# Patient Record
Sex: Female | Born: 1935 | Race: Asian | Hispanic: No | Marital: Married | Smoking: Never smoker
Health system: Southern US, Community
[De-identification: ages and names within clinical notes are randomized; demographics above are authoritative.]

## PROBLEM LIST (undated history)

## (undated) DIAGNOSIS — I219 Acute myocardial infarction, unspecified: Secondary | ICD-10-CM

## (undated) DIAGNOSIS — I639 Cerebral infarction, unspecified: Secondary | ICD-10-CM

## (undated) DIAGNOSIS — E7800 Pure hypercholesterolemia, unspecified: Secondary | ICD-10-CM

## (undated) DIAGNOSIS — I509 Heart failure, unspecified: Secondary | ICD-10-CM

## (undated) DIAGNOSIS — I10 Essential (primary) hypertension: Secondary | ICD-10-CM

---

## 2010-09-03 ENCOUNTER — Inpatient Hospital Stay (INDEPENDENT_AMBULATORY_CARE_PROVIDER_SITE_OTHER)
Admission: RE | Admit: 2010-09-03 | Discharge: 2010-09-03 | Disposition: A | Discharge status: Home / Self Care | Payer: Medicaid Other | Source: Ambulatory Visit | Attending: Family Medicine | Admitting: Family Medicine

## 2010-09-03 DIAGNOSIS — F449 Dissociative and conversion disorder, unspecified: Secondary | ICD-10-CM

## 2012-05-08 ENCOUNTER — Emergency Department (HOSPITAL_COMMUNITY)
Admission: EM | Admit: 2012-05-08 | Discharge: 2012-05-08 | Disposition: A | Discharge status: Home / Self Care | Payer: Medicaid Other | Attending: Emergency Medicine | Admitting: Emergency Medicine

## 2012-05-08 ENCOUNTER — Emergency Department (HOSPITAL_COMMUNITY): Payer: Medicaid Other

## 2012-05-08 ENCOUNTER — Encounter (HOSPITAL_COMMUNITY): Payer: Self-pay | Admitting: Emergency Medicine

## 2012-05-08 DIAGNOSIS — E7800 Pure hypercholesterolemia, unspecified: Secondary | ICD-10-CM | POA: Insufficient documentation

## 2012-05-08 DIAGNOSIS — R079 Chest pain, unspecified: Secondary | ICD-10-CM | POA: Insufficient documentation

## 2012-05-08 DIAGNOSIS — Z79899 Other long term (current) drug therapy: Secondary | ICD-10-CM | POA: Insufficient documentation

## 2012-05-08 DIAGNOSIS — I252 Old myocardial infarction: Secondary | ICD-10-CM | POA: Insufficient documentation

## 2012-05-08 DIAGNOSIS — I10 Essential (primary) hypertension: Secondary | ICD-10-CM | POA: Insufficient documentation

## 2012-05-08 DIAGNOSIS — R0609 Other forms of dyspnea: Secondary | ICD-10-CM | POA: Insufficient documentation

## 2012-05-08 DIAGNOSIS — R0989 Other specified symptoms and signs involving the circulatory and respiratory systems: Secondary | ICD-10-CM | POA: Insufficient documentation

## 2012-05-08 HISTORY — DX: Acute myocardial infarction, unspecified: I21.9

## 2012-05-08 HISTORY — DX: Essential (primary) hypertension: I10

## 2012-05-08 HISTORY — DX: Pure hypercholesterolemia, unspecified: E78.00

## 2012-05-08 LAB — POCT I-STAT TROPONIN I

## 2012-05-08 LAB — COMPREHENSIVE METABOLIC PANEL
BUN: 21 mg/dL (ref 6–23)
CO2: 27 mEq/L (ref 19–32)
Calcium: 8.9 mg/dL (ref 8.4–10.5)
Creatinine, Ser: 0.96 mg/dL (ref 0.50–1.10)
GFR calc Af Amer: 64 mL/min — ABNORMAL LOW (ref >90)
GFR calc non Af Amer: 56 mL/min — ABNORMAL LOW (ref >90)
Glucose, Bld: 99 mg/dL (ref 70–99)
Total Protein: 7.7 g/dL (ref 6.0–8.3)

## 2012-05-08 LAB — CBC
HCT: 28.5 % — ABNORMAL LOW (ref 36.0–46.0)
Hemoglobin: 9.7 g/dL — ABNORMAL LOW (ref 12.0–15.0)
MCH: 25.5 pg — ABNORMAL LOW (ref 26.0–34.0)
MCHC: 34 g/dL (ref 30.0–36.0)
MCV: 75 fL — ABNORMAL LOW (ref 78.0–100.0)

## 2012-05-08 LAB — URINALYSIS, ROUTINE W REFLEX MICROSCOPIC
Leukocytes, UA: NEGATIVE
Nitrite: NEGATIVE
Specific Gravity, Urine: 1.011 (ref 1.005–1.030)
Urobilinogen, UA: 0.2 mg/dL (ref 0.0–1.0)
pH: 6 (ref 5.0–8.0)

## 2012-05-08 MED ORDER — SODIUM CHLORIDE 0.9 % IV SOLN
20.0000 mL | INTRAVENOUS | Status: DC
  Administered 2012-05-08: 20 mL via INTRAVENOUS

## 2012-05-08 NOTE — ED Notes (Signed)
Per EMS: pt only speak [place]; on scene pt c/o chest pain using family as interpreters; pt given 1 nitro 324 ASA en route; per family pt has cardiac history; NKDA; HTN and high cholesterol; unknown if stent was placed; pt takes atrovastatin and lisinopril; 24 G placed in R wrist; 12 lead unremarkable; oxgeyn saturation 91% on room air on scene pt placed on 2L and came up to 100%; BP 166/92 after 1 nitro given

## 2012-05-08 NOTE — ED Notes (Signed)
Pt's BP according to her personal BP machine is 147/98

## 2012-05-08 NOTE — ED Notes (Signed)
Used faces scale to assess pts pain; pt resting in bed currently; NAD noted at this time.

## 2012-05-08 NOTE — ED Notes (Signed)
Using interpreter phone to assess pt; pt states that she is here because when her blood pressure gets high she has a hard time breathing; pt denies chest pain; pt denies blurred vision or headache; pt states her fingertips are numb/tingling; pt alert and mentating appropriately; pt denies n/v/d. Pt denies dizziness and lightheadedness.

## 2012-05-08 NOTE — ED Provider Notes (Signed)
History     CSN: [id number]  Arrival date & time 05/08/12  1128   First MD Initiated Contact with Patient 05/08/12 1155      Chief Complaint  Patient presents with  . Shortness of Breath  . Chest Pain    (Consider location/radiation/quality/duration/timing/severity/associated sxs/prior treatment) HPI Comments: Erin Patterson is a 79 y.o. Female who presents from home, for evaluation, of elevated blood pressure. Her friend interprets for her. This morning the patient had elevated blood pressures approximately 200/150. This was measured in the left arm, by herself. She also had shortness of breath this morning. She is unstable antihypertensive treatment by her PCP. Her PCP, is an Warehouse manager Charlea Nardo, with [facility], in [place]. The patient denies headache, dizziness, chest pain, back pain, change in bowel or urine habits. There are no known modifying factors  Patient is a 79 y.o. female presenting with shortness of breath and chest pain. The history is provided by the patient.  Shortness of Breath Associated symptoms: chest pain   Chest Pain Associated symptoms: shortness of breath     Past Medical History  Diagnosis Date  . MI (myocardial infarction)   . Hypertension   . Hypercholesteremia     History reviewed. No pertinent past surgical history.  History reviewed. No pertinent family history.  History  Substance Use Topics  . Smoking status: Not on file  . Smokeless tobacco: Not on file  . Alcohol Use: Not on file    OB History   Grav Para Term Preterm Abortions TAB SAB Ect Mult Living                  Review of Systems  Respiratory: Positive for shortness of breath.   Cardiovascular: Positive for chest pain.  All other systems reviewed and are negative.    Allergies  Review of patient's allergies indicates no known allergies.  Home Medications   Current Outpatient Rx  Name  Route  Sig  Dispense  Refill  . atorvastatin (LIPITOR)  20 MG tablet   Oral   Take 20 mg by mouth daily.         Marland Kitchen lisinopril-hydrochlorothiazide (PRINZIDE,ZESTORETIC) 20-25 MG per tablet   Oral   Take 1 tablet by mouth daily.         . megestrol (MEGACE) 40 MG/ML suspension   Oral   Take 400 mg by mouth daily.           BP 165/91  Pulse 90  Temp(Src) 98.2 F (36.8 C) (Oral)  Resp 17  SpO2 98%  Physical Exam  Nursing note and vitals reviewed. Constitutional: She is oriented to person, place, and time. She appears well-developed.  Frail, elderly  HENT:  Head: Normocephalic and atraumatic.  Eyes: Conjunctivae and EOM are normal. Pupils are equal, round, and reactive to light.  Neck: Normal range of motion and phonation normal. Neck supple.  Cardiovascular: Normal rate, regular rhythm and intact distal pulses.   Pulmonary/Chest: Effort normal and breath sounds normal. No respiratory distress. She has no wheezes. She has no rales. She exhibits no tenderness.  Abdominal: Soft. She exhibits no distension. There is no tenderness. There is no guarding.  Musculoskeletal: Normal range of motion.  Neurological: She is alert and oriented to person, place, and time. She has normal strength. No cranial nerve deficit. She exhibits normal muscle tone. Coordination normal.  Skin: Skin is warm and dry.  Psychiatric: She has a normal mood and affect. Her behavior is  normal. Judgment and thought content normal.    ED Course  Procedures (including critical care time)  Vital signs monitored closely and blood pressure showed only mild elevation.  Findings discussed with patient and several family members with her. All questions answered.  Labs Reviewed  CBC - Abnormal; Notable for the following:    RBC 3.80 (*)    Hemoglobin 9.7 (*)    HCT 28.5 (*)    MCV 75.0 (*)    MCH 25.5 (*)    Platelets 143 (*)    All other components within normal limits  COMPREHENSIVE METABOLIC PANEL - Abnormal; Notable for the following:    Sodium 134 (*)     Albumin 3.4 (*)    GFR calc non Af Amer 56 (*)    GFR calc Af Amer 64 (*)    All other components within normal limits  URINALYSIS, ROUTINE W REFLEX MICROSCOPIC  POCT I-STAT TROPONIN I   Dg Chest Portable 1 View  05/08/2012  *RADIOLOGY REPORT*  Clinical Data: Chest pain, shortness of breath  PORTABLE CHEST - 1 VIEW  Comparison: None.  Findings: Lungs are essentially clear.  No focal consolidation. No pleural effusion or pneumothorax.  The heart is top normal in size.  IMPRESSION: No evidence of acute cardiopulmonary disease.   Original Report Authenticated By: Charline Bills, M.D.    Nursing Notes Reviewed/ Care Coordinated Applicable Imaging Reviewed Interpretation of Laboratory Data incorporated into ED treatment  1. Hypertension       MDM  Mild hypertension with dyspnea. No apparent heart failure, ACS, pneumonia, or apparent bronchitis. Doubt metabolic instability, serious bacterial infection or impending vascular collapse; the patient is stable for discharge.  Plan: Home Medications- usual; Home Treatments- rest; Recommended follow up- PCP check up 1 week     Flint Melter, MD 05/08/12 2151

## 2014-12-10 ENCOUNTER — Emergency Department (HOSPITAL_COMMUNITY): Payer: Medicaid Other

## 2014-12-10 ENCOUNTER — Inpatient Hospital Stay (HOSPITAL_COMMUNITY)
Admission: EM | Admit: 2014-12-10 | Discharge: 2014-12-13 | DRG: 103 | Disposition: A | Discharge status: Home / Self Care | Payer: Medicaid Other | Attending: Family Medicine | Admitting: Family Medicine

## 2014-12-10 DIAGNOSIS — R509 Fever, unspecified: Secondary | ICD-10-CM

## 2014-12-10 DIAGNOSIS — Z881 Allergy status to other antibiotic agents status: Secondary | ICD-10-CM

## 2014-12-10 DIAGNOSIS — I219 Acute myocardial infarction, unspecified: Secondary | ICD-10-CM

## 2014-12-10 DIAGNOSIS — H538 Other visual disturbances: Secondary | ICD-10-CM | POA: Diagnosis present

## 2014-12-10 DIAGNOSIS — R519 Headache, unspecified: Secondary | ICD-10-CM

## 2014-12-10 DIAGNOSIS — I213 ST elevation (STEMI) myocardial infarction of unspecified site: Secondary | ICD-10-CM | POA: Diagnosis not present

## 2014-12-10 DIAGNOSIS — E7800 Pure hypercholesterolemia, unspecified: Secondary | ICD-10-CM | POA: Diagnosis present

## 2014-12-10 DIAGNOSIS — R51 Headache: Secondary | ICD-10-CM | POA: Diagnosis not present

## 2014-12-10 DIAGNOSIS — E785 Hyperlipidemia, unspecified: Secondary | ICD-10-CM

## 2014-12-10 DIAGNOSIS — H510 Palsy (spasm) of conjugate gaze: Secondary | ICD-10-CM | POA: Diagnosis present

## 2014-12-10 DIAGNOSIS — H547 Unspecified visual loss: Secondary | ICD-10-CM

## 2014-12-10 DIAGNOSIS — B348 Other viral infections of unspecified site: Secondary | ICD-10-CM | POA: Diagnosis present

## 2014-12-10 DIAGNOSIS — I252 Old myocardial infarction: Secondary | ICD-10-CM

## 2014-12-10 DIAGNOSIS — I2510 Atherosclerotic heart disease of native coronary artery without angina pectoris: Secondary | ICD-10-CM

## 2014-12-10 DIAGNOSIS — I10 Essential (primary) hypertension: Secondary | ICD-10-CM | POA: Diagnosis present

## 2014-12-10 DIAGNOSIS — E876 Hypokalemia: Secondary | ICD-10-CM | POA: Diagnosis present

## 2014-12-10 DIAGNOSIS — R262 Difficulty in walking, not elsewhere classified: Secondary | ICD-10-CM | POA: Diagnosis present

## 2014-12-10 LAB — PROTEIN, CSF: TOTAL PROTEIN, CSF: 21 mg/dL (ref 15–45)

## 2014-12-10 LAB — GLUCOSE, CSF: GLUCOSE CSF: 60 mg/dL (ref 40–70)

## 2014-12-10 LAB — URINALYSIS, ROUTINE W REFLEX MICROSCOPIC
Bilirubin Urine: NEGATIVE
Glucose, UA: NEGATIVE mg/dL
Hgb urine dipstick: NEGATIVE
KETONES UR: 15 mg/dL — AB
LEUKOCYTES UA: NEGATIVE
NITRITE: NEGATIVE
PH: 6 (ref 5.0–8.0)
Protein, ur: NEGATIVE mg/dL
SPECIFIC GRAVITY, URINE: 1.015 (ref 1.005–1.030)
Urobilinogen, UA: 1 mg/dL (ref 0.0–1.0)

## 2014-12-10 LAB — CSF CELL COUNT WITH DIFFERENTIAL
RBC Count, CSF: 445 /mm3 — ABNORMAL HIGH
RBC Count, CSF: 256 /mm3 — ABNORMAL HIGH
TUBE #: 4
Tube #: 1
WBC, CSF: 1 /mm3 (ref 0–5)
WBC, CSF: 3 /mm3 (ref 0–5)

## 2014-12-10 LAB — COMPREHENSIVE METABOLIC PANEL
ALT: 25 U/L (ref 14–54)
AST: 39 U/L (ref 15–41)
Albumin: 3.6 g/dL (ref 3.5–5.0)
Alkaline Phosphatase: 60 U/L (ref 38–126)
Anion gap: 10 (ref 5–15)
BUN: 24 mg/dL — AB (ref 6–20)
CHLORIDE: 97 mmol/L — AB (ref 101–111)
CO2: 24 mmol/L (ref 22–32)
Calcium: 8.7 mg/dL — ABNORMAL LOW (ref 8.9–10.3)
Creatinine, Ser: 1.02 mg/dL — ABNORMAL HIGH (ref 0.44–1.00)
GFR calc Af Amer: 59 mL/min — ABNORMAL LOW (ref >60)
GFR calc non Af Amer: 51 mL/min — ABNORMAL LOW (ref >60)
GLUCOSE: 127 mg/dL — AB (ref 65–99)
POTASSIUM: 3.4 mmol/L — AB (ref 3.5–5.1)
Sodium: 131 mmol/L — ABNORMAL LOW (ref 135–145)
Total Bilirubin: 0.9 mg/dL (ref 0.3–1.2)
Total Protein: 8.4 g/dL — ABNORMAL HIGH (ref 6.5–8.1)

## 2014-12-10 LAB — CBC WITH DIFFERENTIAL/PLATELET
BASOS ABS: 0 10*3/uL (ref 0.0–0.1)
Basophils Relative: 0 %
EOS PCT: 0 %
Eosinophils Absolute: 0 10*3/uL (ref 0.0–0.7)
HEMATOCRIT: 31.2 % — AB (ref 36.0–46.0)
HEMOGLOBIN: 10.6 g/dL — AB (ref 12.0–15.0)
LYMPHS ABS: 0.6 10*3/uL — AB (ref 0.7–4.0)
LYMPHS PCT: 8 %
MCH: 25.9 pg — AB (ref 26.0–34.0)
MCHC: 34 g/dL (ref 30.0–36.0)
MCV: 76.3 fL — AB (ref 78.0–100.0)
Monocytes Absolute: 0.7 10*3/uL (ref 0.1–1.0)
Monocytes Relative: 9 %
NEUTROS ABS: 6.6 10*3/uL (ref 1.7–7.7)
NEUTROS PCT: 83 %
PLATELETS: 137 10*3/uL — AB (ref 150–400)
RBC: 4.09 MIL/uL (ref 3.87–5.11)
RDW: 14.3 % (ref 11.5–15.5)
WBC: 7.9 10*3/uL (ref 4.0–10.5)

## 2014-12-10 LAB — I-STAT CG4 LACTIC ACID, ED
LACTIC ACID, VENOUS: 0.78 mmol/L (ref 0.5–2.0)
LACTIC ACID, VENOUS: 0.79 mmol/L (ref 0.5–2.0)

## 2014-12-10 LAB — SEDIMENTATION RATE: Sed Rate: 56 mm/hr — ABNORMAL HIGH (ref 0–22)

## 2014-12-10 LAB — C-REACTIVE PROTEIN: CRP: 2 mg/dL — ABNORMAL HIGH (ref <1.0)

## 2014-12-10 LAB — TSH: TSH: 0.33 u[IU]/mL — ABNORMAL LOW (ref 0.350–4.500)

## 2014-12-10 MED ORDER — SODIUM CHLORIDE 0.9 % IV BOLUS (SEPSIS)
1000.0000 mL | Freq: Once | INTRAVENOUS | Status: AC
  Administered 2014-12-10: 1000 mL via INTRAVENOUS

## 2014-12-10 MED ORDER — ACETAMINOPHEN 650 MG RE SUPP: 650.0000 mg | Freq: Four times a day (QID) | RECTAL | Status: DC | PRN

## 2014-12-10 MED ORDER — LISINOPRIL 20 MG PO TABS
20.0000 mg | ORAL_TABLET | Freq: Every day | ORAL | Status: DC
  Administered 2014-12-11 – 2014-12-13 (×3): 20 mg via ORAL
  Filled 2014-12-10 (×3): qty 1

## 2014-12-10 MED ORDER — ACETAMINOPHEN 325 MG PO TABS
650.0000 mg | ORAL_TABLET | Freq: Once | ORAL | Status: AC
  Administered 2014-12-10: 650 mg via ORAL
  Filled 2014-12-10: qty 2

## 2014-12-10 MED ORDER — SODIUM CHLORIDE 0.9 % IV SOLN
INTRAVENOUS | Status: DC
  Administered 2014-12-12 – 2014-12-13 (×3): via INTRAVENOUS

## 2014-12-10 MED ORDER — ACETAMINOPHEN 325 MG PO TABS: 650.0000 mg | ORAL_TABLET | Freq: Once | ORAL | Status: DC

## 2014-12-10 MED ORDER — LIDOCAINE-EPINEPHRINE (PF) 2 %-1:200000 IJ SOLN
20.0000 mL | Freq: Once | INTRAMUSCULAR | Status: AC
  Administered 2014-12-10: 20 mL via INTRADERMAL
  Filled 2014-12-10: qty 20

## 2014-12-10 MED ORDER — MORPHINE SULFATE (PF) 2 MG/ML IV SOLN
2.0000 mg | Freq: Once | INTRAVENOUS | Status: DC
  Filled 2014-12-10: qty 1

## 2014-12-10 MED ORDER — DILTIAZEM HCL ER COATED BEADS 180 MG PO CP24
180.0000 mg | ORAL_CAPSULE | Freq: Every day | ORAL | Status: DC
  Administered 2014-12-11 – 2014-12-13 (×3): 180 mg via ORAL
  Filled 2014-12-10 (×3): qty 1

## 2014-12-10 MED ORDER — HYDROCHLOROTHIAZIDE 25 MG PO TABS
12.5000 mg | ORAL_TABLET | Freq: Every day | ORAL | Status: DC
  Administered 2014-12-10 – 2014-12-13 (×4): 12.5 mg via ORAL
  Filled 2014-12-10 (×4): qty 1

## 2014-12-10 MED ORDER — ENOXAPARIN SODIUM 40 MG/0.4ML ~~LOC~~ SOLN: 40.0000 mg | SUBCUTANEOUS | Status: DC

## 2014-12-10 MED ORDER — DEXAMETHASONE SODIUM PHOSPHATE 10 MG/ML IJ SOLN
10.0000 mg | Freq: Once | INTRAMUSCULAR | Status: AC
  Administered 2014-12-10: 10 mg via INTRAVENOUS
  Filled 2014-12-10: qty 1

## 2014-12-10 MED ORDER — DEXTROSE 5 % IV SOLN
2.0000 g | Freq: Once | INTRAVENOUS | Status: AC
  Administered 2014-12-10: 2 g via INTRAVENOUS
  Filled 2014-12-10: qty 2

## 2014-12-10 MED ORDER — PREDNISONE 20 MG PO TABS
60.0000 mg | ORAL_TABLET | Freq: Every day | ORAL | Status: DC
  Administered 2014-12-11 – 2014-12-13 (×3): 60 mg via ORAL
  Filled 2014-12-10 (×3): qty 3

## 2014-12-10 MED ORDER — ATORVASTATIN CALCIUM 10 MG PO TABS
20.0000 mg | ORAL_TABLET | Freq: Every day | ORAL | Status: DC
  Administered 2014-12-10 – 2014-12-13 (×4): 20 mg via ORAL
  Filled 2014-12-10 (×4): qty 2

## 2014-12-10 MED ORDER — SODIUM CHLORIDE 0.9 % IV SOLN
2.0000 g | Freq: Once | INTRAVENOUS | Status: AC
  Administered 2014-12-10: 2 g via INTRAVENOUS
  Filled 2014-12-10: qty 2000

## 2014-12-10 MED ORDER — DEXTROSE 5 % IV SOLN
2.0000 g | Freq: Two times a day (BID) | INTRAVENOUS | Status: DC
  Administered 2014-12-11 – 2014-12-12 (×3): 2 g via INTRAVENOUS
  Filled 2014-12-10 (×4): qty 2

## 2014-12-10 MED ORDER — ONDANSETRON HCL 4 MG/2ML IJ SOLN
4.0000 mg | Freq: Four times a day (QID) | INTRAMUSCULAR | Status: DC | PRN
  Administered 2014-12-11: 4 mg via INTRAVENOUS
  Filled 2014-12-10: qty 2

## 2014-12-10 MED ORDER — ENOXAPARIN SODIUM 30 MG/0.3ML ~~LOC~~ SOLN
30.0000 mg | SUBCUTANEOUS | Status: DC
  Administered 2014-12-10 – 2014-12-12 (×3): 30 mg via SUBCUTANEOUS
  Filled 2014-12-10 (×3): qty 0.3

## 2014-12-10 MED ORDER — VANCOMYCIN HCL IN DEXTROSE 1-5 GM/200ML-% IV SOLN
1000.0000 mg | Freq: Once | INTRAVENOUS | Status: AC
  Administered 2014-12-10: 1000 mg via INTRAVENOUS
  Filled 2014-12-10: qty 200

## 2014-12-10 MED ORDER — ACETAMINOPHEN 325 MG PO TABS
650.0000 mg | ORAL_TABLET | Freq: Four times a day (QID) | ORAL | Status: DC | PRN
  Administered 2014-12-11 – 2014-12-12 (×3): 650 mg via ORAL
  Filled 2014-12-10 (×3): qty 2

## 2014-12-10 MED ORDER — SODIUM CHLORIDE 0.9 % IV SOLN
2.0000 g | Freq: Three times a day (TID) | INTRAVENOUS | Status: DC
  Administered 2014-12-10 – 2014-12-12 (×5): 2 g via INTRAVENOUS
  Filled 2014-12-10 (×8): qty 2000

## 2014-12-10 MED ORDER — DEXTROSE 5 % IV SOLN
2.0000 g | Freq: Once | INTRAVENOUS | Status: DC
  Filled 2014-12-10: qty 2

## 2014-12-10 MED ORDER — VANCOMYCIN HCL IN DEXTROSE 750-5 MG/150ML-% IV SOLN
750.0000 mg | INTRAVENOUS | Status: DC
  Administered 2014-12-11: 750 mg via INTRAVENOUS
  Filled 2014-12-10 (×2): qty 150

## 2014-12-10 NOTE — Progress Notes (Signed)
Patient admitted to [phone]. VSS, tele monitor set up, droplet precautions initiated. Report given to [place], receiving night RN.

## 2014-12-10 NOTE — ED Notes (Signed)
Spoke with friend on phone - will come at 2pm/ is able to interpret.

## 2014-12-10 NOTE — Procedures (Signed)
CLINICAL DATA: [Headache and fever.] EXAM: DIAGNOSTIC LUMBAR PUNCTURE UNDER FLUOROSCOPIC GUIDANCE FLUOROSCOPY TIME:  Fluoroscopy Time (in minutes and seconds): [3 minutes and 4 second] Number of Acquired Images: [None] PROCEDURE: Informed consent was obtained from the patient prior to the procedure, including potential complications of headache, allergy, and pain. With the patient prone, the lower back was prepped with Betadine. 1% Lidocaine was used for local anesthesia. Lumbar puncture was performed at the [L2-3] level using a [9 cm needle with return of [clear] CSF with an opening pressure of [less than 12 cm] cm water. [8] ml of CSF were obtained for laboratory studies. The patient tolerated the procedure well and there were no apparent complications. IMPRESSION: [Uncomplicated lumbar puncture, as detailed above.]

## 2014-12-10 NOTE — ED Notes (Signed)
Pt's family member at bedside using nurse phone to contact family member.

## 2014-12-10 NOTE — Progress Notes (Signed)
ANTIBIOTIC CONSULT NOTE - INITIAL  Pharmacy Consult for vancomycin, ceftriaxone, ampicillin  Indication: rule out meningitis   No Known Allergies  Patient Measurements:   Adjusted Body Weight:   Vital Signs: Temp: 100 F (37.8 C) (10/15 1115) Temp Source: Rectal (10/15 1115) BP: 144/83 mmHg (10/15 1115) Pulse Rate: 88 (10/15 1115) Intake/Output from previous day:   Intake/Output from this shift:    Labs: No results for input(s): WBC, HGB, PLT, LABCREA, CREATININE in the last 72 hours. CrCl cannot be calculated (Unknown ideal weight.). No results for input(s): VANCOTROUGH, VANCOPEAK, VANCORANDOM, GENTTROUGH, GENTPEAK, GENTRANDOM, TOBRATROUGH, TOBRAPEAK, TOBRARND, AMIKACINPEAK, AMIKACINTROU, AMIKACIN in the last 72 hours.   Microbiology: No results found for this or any previous visit (from the past 720 hour(s)).  Medical History: Past Medical History  Diagnosis Date  . MI (myocardial infarction)   . Hypertension   . Hypercholesteremia     Medications:   (Not in a hospital admission)   Assessment: Abx for rule out meninigits Weight 50 kg is best estimation eCrCl 30-40 ml/min Blood/urine cx drawn  Goal of Therapy:  Vancomycin trough level 15-20 mcg/ml   Plan:  -Vancomycin 1 g IV x1 then 750 mg IV q24h -Ceftriaxone 2 g IV q12h -Ampicillin 2 g IV x1 then 2g IV q8h -Monitor renal fx, cultures, VT at steady state -F/u more history when interpreter arrives   Agapito Games, PharmD, BCPS Clinical Pharmacist Pager: [phone] 12/10/2014 12:28 PM

## 2014-12-10 NOTE — ED Notes (Signed)
Permit signed with assistance of language line interpretor

## 2014-12-10 NOTE — ED Notes (Signed)
Performed In and Out cath on patient with success with stand by assistance from Clydie Braun, [place] who was present in room; husband at beside

## 2014-12-10 NOTE — ED Notes (Addendum)
Pt to ED via [facility] from home-- pt ONLY SPEAKS [place]  -- C/O dizziness and "unable to see" -- able to see Dr. Denny Levy fingers when held in front of eyes- would not look to right with eyes-- not going past midline.  Using Language line-- [place] interpretor  States was feeling bad 3 days ago. First symptoms -- headache "took medicine dr gave me and an advil yesterday"  Denies chest pain, no nausea/ vomiting/diarrhea, denies coughing. Having poor appetite, has not eaten today, had a bowl of rice yesterday.

## 2014-12-10 NOTE — ED Notes (Signed)
Report given to Tresa Endo, RN on 5M

## 2014-12-10 NOTE — ED Notes (Signed)
Patient undressed, in gown, on monitor, continuous pulse oximetry and blood pressure cuff; husband at bedside; patient only speaks [place]

## 2014-12-10 NOTE — ED Provider Notes (Signed)
CSN: [id number]     Arrival date & time 12/10/14  1106 History   First MD Initiated Contact with Patient 12/10/14 1108     Chief Complaint  Patient presents with  . Fever  . Headache  . Emesis    Level V caveat secondary to language barrier (Consider location/radiation/quality/duration/timing/severity/associated sxs/prior Treatment) HPI   81 year old female who speaks only [place] presents today complaining that she has a headache, vision changes and difficulty walking which began with a headache three days ago.  Denies nausea, vomiting, cough.  She states she is eating less than usual but ate rice yesterday.  She is unable to specify headache symptoms.  She denies lateralized weakness.  States she cannot see, but is not specific to what this means.    Past Medical History  Diagnosis Date  . MI (myocardial infarction)   . Hypertension   . Hypercholesteremia    No past surgical history on file. No family history on file. Social History  Substance Use Topics  . Smoking status: Not on file  . Smokeless tobacco: Not on file  . Alcohol Use: Not on file   OB History    No data available     Review of Systems  Unable to perform ROS: Other      Allergies  Review of patient's allergies indicates no known allergies.  Home Medications   Prior to Admission medications   Medication Sig Start Date End Date Taking? Authorizing Provider  atorvastatin (LIPITOR) 20 MG tablet Take 20 mg by mouth daily.   Yes Historical Provider, MD  diltiazem (CARTIA XT) 180 MG 24 hr capsule Take 180 mg by mouth daily.   Yes Historical Provider, MD  hydrochlorothiazide (HYDRODIURIL) 12.5 MG tablet Take 12.5 mg by mouth daily.   Yes Historical Provider, MD  lisinopril (PRINIVIL,ZESTRIL) 20 MG tablet Take 20 mg by mouth daily.   Yes Historical Provider, MD   BP 132/74 mmHg  Pulse 79  Temp(Src) 99.2 F (37.3 C) (Oral)  Resp 19  Ht  (1.473 m)  Wt 110 lb (49.896 kg)  BMI 23.00 kg/m2  SpO2  97% Physical Exam  Constitutional: She appears well-developed and well-nourished.  HENT:  Head: Normocephalic and atraumatic.  Right Ear: External ear normal.  Left Ear: External ear normal.  Mouth/Throat: Uvula is midline.  Mucous membranes are dry  Eyes: Conjunctivae are normal.  Gaze not present beyond midline to right  Neck: Normal range of motion. Neck supple.  Cardiovascular: Normal rate, regular rhythm and normal heart sounds.   Pulmonary/Chest: Effort normal and breath sounds normal.  Abdominal: Soft. Bowel sounds are normal.  Musculoskeletal: Normal range of motion.  Neck supple  Neurological: She is alert. She has normal reflexes. She displays normal reflexes. No cranial nerve deficit. She exhibits normal muscle tone. Coordination normal.  Skin: Skin is warm and dry.  Nursing note and vitals reviewed.   ED Course  Procedures (including critical care time) Labs Review Labs Reviewed  CBC WITH DIFFERENTIAL/PLATELET - Abnormal; Notable for the following:    Hemoglobin 10.6 (*)    HCT 31.2 (*)    MCV 76.3 (*)    MCH 25.9 (*)    Platelets 137 (*)    Lymphs Abs 0.6 (*)    All other components within normal limits  COMPREHENSIVE METABOLIC PANEL - Abnormal; Notable for the following:    Sodium 131 (*)    Potassium 3.4 (*)    Chloride 97 (*)    Glucose, Bld 127 (*)  BUN 24 (*)    Creatinine, Ser 1.02 (*)    Calcium 8.7 (*)    Total Protein 8.4 (*)    GFR calc non Af Amer 51 (*)    GFR calc Af Amer 59 (*)    All other components within normal limits  URINALYSIS, ROUTINE W REFLEX MICROSCOPIC (NOT AT [facility]) - Abnormal; Notable for the following:    Ketones, ur 15 (*)    All other components within normal limits  CULTURE, BLOOD (ROUTINE X 2)  CULTURE, BLOOD (ROUTINE X 2)  URINE CULTURE  CSF CULTURE  CSF CELL COUNT WITH DIFFERENTIAL  CSF CELL COUNT WITH DIFFERENTIAL  PROTEIN AND GLUCOSE, CSF  I-STAT CG4 LACTIC ACID, ED  I-STAT CG4 LACTIC ACID, ED  I-STAT CG4  LACTIC ACID, ED  I-STAT CG4 LACTIC ACID, ED    Imaging Review Ct Head Wo Contrast  12/10/2014  CLINICAL DATA:  Dizziness, visual changes, headache for 3 days EXAM: CT HEAD WITHOUT CONTRAST TECHNIQUE: Contiguous axial images were obtained from the base of the skull through the vertex without intravenous contrast. COMPARISON:  None. FINDINGS: No intracranial hemorrhage, mass effect or midline shift. Atherosclerotic calcifications of carotid siphon bilaterally. Mild mucosal thickening left sphenoid sinus. Mild cerebral atrophy. No intracranial hemorrhage, mass effect or midline shift. Bilateral basal ganglia small lacunar infarcts are noted. No definite acute cortical infarction. No mass lesion is noted on this unenhanced scan. IMPRESSION: No acute intracranial abnormality. Mild cerebral atrophy. Atherosclerotic calcifications of carotid siphon. Bilateral basal ganglia small lacunar infarcts are noted. No definite acute cortical infarction. Electronically Signed   By: Natasha Mead M.D.   On: 12/10/2014 13:36   Dg Chest Port 1 View  12/10/2014  CLINICAL DATA:  Headache, dizziness EXAM: PORTABLE CHEST 1 VIEW COMPARISON:  05/08/2012 FINDINGS: Mild left basilar opacity, likely atelectasis. Possible small left pleural effusion. No pleural effusion or pneumothorax. The heart is normal in size. IMPRESSION: Mild left basilar opacity, likely atelectasis. Possible small left pleural effusion. Electronically Signed   By: Charline Bills M.D.   On: 12/10/2014 12:16   I have personally reviewed and evaluated these images and lab results as part of my medical decision-making.   EKG Interpretation None      MDM   Final diagnoses:  Headache    81 y.o. Female with right visual gaze palsy, headache, and fever.  LP attempted in ed unsuccesfully.  Radiology consulted and will perform lp.  CSF labs ordered.  Rocephin, ampicillin and vancomycin ordered per meningitis order set.   CXR with small opacity at left  base. Plan admission for further evaluation and treatment. 1- fever and headache- empirically treated for meningitis.  lp pending per radiology. 2- vision changes- will require further evaluation,  3- anemia- mild, will need to be followed.  Discussed with Dr. Sharlyn Bologna, MD 12/10/14 [phone]

## 2014-12-10 NOTE — ED Notes (Signed)
LP done with assistance of interpretor from language line. Unsuccessful. Will consult radiology.

## 2014-12-10 NOTE — H&P (Signed)
[facility] History and Physical  Erin Patterson MRN:[id number] DOB: 12/08/1932 DOA: 12/10/2014  Referring physician: ED PCP: No primary care provider on file.   Chief Complaint: Vision loss and fever  HPI:  Patient is a 81-year-old Cambodian only speaking female with a past medical history for hyperlipidemia, hypertension, coronary artery disease, and previous MI; who presents with acute onset of vision loss from last 3 days. History is obtained with use of the translation phone. Patient is somewhat of a poor historian as her complaints change with repeated attempts to understand what is going on. The patient initially stated that she had a headache the last 3 days at with decreased sight in both eyes. In addition she reported feeling tired,  with chills, and shortness of breath like she was going to pass out. However upon reassessing the patient she had a headache and she then relayed that she did not. She endorses living in the [place] for the last 7 years with no recent travel.  Upon arrival to the emergency department evaluation by the ED physician observed a possible right eye vision palsy CT imaging showed signs of possible old infarcts but was concerned for the possibility of meningitis for which a LP was obtained.    Review of Systems: A complete review of systems was performed and negative except for as noted above in history of present illness    Past Medical History  Diagnosis Date  . MI (myocardial infarction)   . Hypertension   . Hypercholesteremia      No past surgical history on file.    Social History:  has no tobacco, alcohol, and drug history on file. where does patient live--home and with whom if at home?  Can patient participate in ADLs? Y  No Known Allergies  No family history on file.    FAMILY HISTORY  When questioned  Directly-patient reports  No family history of HTN, CVA ,DIABETES, TB, Cancer CAD, Bleeding Disorders, Sickle Cell, diabetes,  anemia, asthma,   Prior to Admission medications   Medication Sig Start Date End Date Taking? Authorizing Provider  atorvastatin (LIPITOR) 20 MG tablet Take 20 mg by mouth daily.   Yes Historical Provider, MD  diltiazem (CARTIA XT) 180 MG 24 hr capsule Take 180 mg by mouth daily.   Yes Historical Provider, MD  hydrochlorothiazide (HYDRODIURIL) 12.5 MG tablet Take 12.5 mg by mouth daily.   Yes Historical Provider, MD  lisinopril (PRINIVIL,ZESTRIL) 20 MG tablet Take 20 mg by mouth daily.   Yes Historical Provider, MD     Physical Exam: Filed Vitals:   12/10/14 1115 12/10/14 1243 12/10/14 1333 12/10/14 1724  BP: 144/83  132/74 130/75  Pulse: 88  79 87  Temp: 100 F (37.8 C)  99.2 F (37.3 C)   TempSrc: Rectal  Oral   Resp:   19 18  Height:  4' 10" (1.473 m)    Weight:  49.896 kg (110 lb)    SpO2: 96%  97% 98%     Constitutional: Vital signs reviewed. Patient is a thin female that appears to be in mild distress distress and cooperative with exam. Alert and oriented x3.  Head: Normocephalic and atraumatic  Ear: TM normal bilaterally  Mouth: no erythema or exudatesdry mucous membranes Eyes: PERRL,  No scleral icterus.  Neck: No significant rigidity appreciated, Trachea midline normal ROM, No JVD, mass, thyromegaly, or carotid bruit present.  Cardiovascular: RRR, S1 normal, S2 normal, no MRG, pulses symmetric and intact bilaterally  Pulmonary/Chest:   CTAB, no wheezes, rales, or rhonchi  Abdominal: Soft. Non-tender, non-distended, bowel sounds are normal, no masses, organomegaly, or guarding present.  GU: no CVA tenderness Musculoskeletal: No joint deformities, erythema, or stiffness, ROM full and no nontender Ext: no edema and no cyanosis, pulses palpable bilaterally (DP and PT)  Hematology: no cervical, inginal, or axillary adenopathy.  Neurological: A&O x3, Strenght is normal and symmetric bilaterally, cranial nerve II-XII are grossly intact able to move all extremities.   Skin:  Warm, dry and intact. No rash, cyanosis, or clubbing.  Psychiatric: Normal mood and affect. speech and behavior is normal. Judgment and thought content normal. Cognition and memory are normal.      Data Review   Micro Results No results found for this or any previous visit (from the past 240 hour(s)).  Radiology Reports Ct Head Wo Contrast  12/10/2014  CLINICAL DATA:  Dizziness, visual changes, headache for 3 days EXAM: CT HEAD WITHOUT CONTRAST TECHNIQUE: Contiguous axial images were obtained from the base of the skull through the vertex without intravenous contrast. COMPARISON:  None. FINDINGS: No intracranial hemorrhage, mass effect or midline shift. Atherosclerotic calcifications of carotid siphon bilaterally. Mild mucosal thickening left sphenoid sinus. Mild cerebral atrophy. No intracranial hemorrhage, mass effect or midline shift. Bilateral basal ganglia small lacunar infarcts are noted. No definite acute cortical infarction. No mass lesion is noted on this unenhanced scan. IMPRESSION: No acute intracranial abnormality. Mild cerebral atrophy. Atherosclerotic calcifications of carotid siphon. Bilateral basal ganglia small lacunar infarcts are noted. No definite acute cortical infarction. Electronically Signed   By: Lahoma Crocker M.D.   On: 12/10/2014 13:36   Dg Chest Port 1 View  12/10/2014  CLINICAL DATA:  Headache, dizziness EXAM: PORTABLE CHEST 1 VIEW COMPARISON:  05/08/2012 FINDINGS: Mild left basilar opacity, likely atelectasis. Possible small left pleural effusion. No pleural effusion or pneumothorax. The heart is normal in size. IMPRESSION: Mild left basilar opacity, likely atelectasis. Possible small left pleural effusion. Electronically Signed   By: Julian Hy M.D.   On: 12/10/2014 12:16   Dg Lumbar Puncture Fluoro Guide  12/10/2014  CLINICAL DATA:  Headache and fever. EXAM: DIAGNOSTIC LUMBAR PUNCTURE UNDER FLUOROSCOPIC GUIDANCE FLUOROSCOPY TIME:  Fluoroscopy Time (in minutes  and seconds): 3 minutes and 4 second Number of Acquired Images:  None PROCEDURE: Informed consent was obtained from the patient prior to the procedure, including potential complications of headache, allergy, and pain. With the patient prone, the lower back was prepped with Betadine. 1% Lidocaine was used for local anesthesia. Lumbar puncture was performed at the L2-3 level using a 9 cm needle with return of clear CSF with an opening pressure of less than 12 cm cm water. 8 ml of CSF were obtained for laboratory studies. The patient tolerated the procedure well and there were no apparent complications. IMPRESSION: Uncomplicated lumbar puncture, as detailed above. Electronically Signed   By: Abigail Miyamoto M.D.   On: 12/10/2014 17:03     CBC  Recent Labs Lab 12/10/14 1215  WBC 7.9  HGB 10.6*  HCT 31.2*  PLT 137*  MCV 76.3*  MCH 25.9*  MCHC 34.0  RDW 14.3  LYMPHSABS 0.6*  MONOABS 0.7  EOSABS 0.0  BASOSABS 0.0    Chemistries   Recent Labs Lab 12/10/14 1215  NA 131*  K 3.4*  CL 97*  CO2 24  GLUCOSE 127*  BUN 24*  CREATININE 1.02*  CALCIUM 8.7*  AST 39  ALT 25  ALKPHOS 60  BILITOT 0.9   ------------------------------------------------------------------------------------------------------------------  estimated creatinine clearance is 30.9 mL/min (by C-G formula based on Cr of 1.02). ------------------------------------------------------------------------------------------------------------------ No results for input(s): HGBA1C in the last 72 hours. ------------------------------------------------------------------------------------------------------------------ No results for input(s): CHOL, HDL, LDLCALC, TRIG, CHOLHDL, LDLDIRECT in the last 72 hours. ------------------------------------------------------------------------------------------------------------------ No results for input(s): TSH, T4TOTAL, T3FREE, THYROIDAB in the last 72 hours.  Invalid input(s):  FREET3 ------------------------------------------------------------------------------------------------------------------ No results for input(s): VITAMINB12, FOLATE, FERRITIN, TIBC, IRON, RETICCTPCT in the last 72 hours.  Coagulation profile No results for input(s): INR, PROTIME in the last 168 hours.  No results for input(s): DDIMER in the last 72 hours.  Cardiac Enzymes No results for input(s): CKMB, TROPONINI, MYOGLOBIN in the last 168 hours.  Invalid input(s): CK ------------------------------------------------------------------------------------------------------------------ Invalid input(s): POCBNP   CBG: No results for input(s): GLUCAP in the last 168 hours.     EKG: Independently reviewed.    Assessment/Plan Active Problems:   Headache-Unclear if patient previous complaints of headache or pain have actually resolved. The possibility of meningitis is still  present. - Checking blood cultures  - follow-up CSF cytology - continued Antibiotics of ampicillin, vancomycin, and ceftriaxone - May consider consultation ID  Fever: Patient observed with a low-grade fever here workup started - Checking blood cultures 2 follow-up results - U/A ordered - Checking influenza panel - Droplet precautions  Vision loss: at this time imaging studies have been negative -Checking ESR and a CRP rule out of an infectious or inflammatory process like temporal arteritis -Will give patient 60 mg of prednisone  HLD (hyperlipidemia)  -Continue atorvastatin - Check a lipid panel in a.m.   HTN: Stable - Continue lisinopril  Hx of MI (myocardial infarction) and CAD (coronary artery disease) - Continue home meds      Code Status:   full Family Communication: bedside Disposition Plan: admit   Total time spent 55 minutes.Greater than 50% of this time was spent in counseling, explanation of diagnosis, planning of further management, and coordination of care  [facility] Pager [phone]  If 7PM-7AM, please contact night-coverage [email or web address] Password [email or web address] 12/10/2014, 5:48 PM

## 2014-12-10 NOTE — ED Notes (Signed)
Dr. Katrinka Blazing in to assess and talk to pt. Via interpreter phone.

## 2014-12-11 ENCOUNTER — Encounter (HOSPITAL_COMMUNITY): Payer: Self-pay | Admitting: *Deleted

## 2014-12-11 DIAGNOSIS — Z881 Allergy status to other antibiotic agents status: Secondary | ICD-10-CM | POA: Diagnosis not present

## 2014-12-11 DIAGNOSIS — H510 Palsy (spasm) of conjugate gaze: Secondary | ICD-10-CM | POA: Diagnosis present

## 2014-12-11 DIAGNOSIS — I252 Old myocardial infarction: Secondary | ICD-10-CM | POA: Diagnosis not present

## 2014-12-11 DIAGNOSIS — E7800 Pure hypercholesterolemia, unspecified: Secondary | ICD-10-CM | POA: Diagnosis present

## 2014-12-11 DIAGNOSIS — H547 Unspecified visual loss: Secondary | ICD-10-CM | POA: Diagnosis present

## 2014-12-11 DIAGNOSIS — E876 Hypokalemia: Secondary | ICD-10-CM | POA: Diagnosis present

## 2014-12-11 DIAGNOSIS — B348 Other viral infections of unspecified site: Secondary | ICD-10-CM | POA: Diagnosis present

## 2014-12-11 DIAGNOSIS — R51 Headache: Principal | ICD-10-CM

## 2014-12-11 DIAGNOSIS — I10 Essential (primary) hypertension: Secondary | ICD-10-CM | POA: Diagnosis present

## 2014-12-11 DIAGNOSIS — I2510 Atherosclerotic heart disease of native coronary artery without angina pectoris: Secondary | ICD-10-CM | POA: Diagnosis present

## 2014-12-11 DIAGNOSIS — E785 Hyperlipidemia, unspecified: Secondary | ICD-10-CM | POA: Diagnosis present

## 2014-12-11 DIAGNOSIS — H538 Other visual disturbances: Secondary | ICD-10-CM | POA: Diagnosis present

## 2014-12-11 DIAGNOSIS — R509 Fever, unspecified: Secondary | ICD-10-CM | POA: Diagnosis present

## 2014-12-11 DIAGNOSIS — R262 Difficulty in walking, not elsewhere classified: Secondary | ICD-10-CM | POA: Diagnosis present

## 2014-12-11 LAB — COMPREHENSIVE METABOLIC PANEL
ALBUMIN: 2.9 g/dL — AB (ref 3.5–5.0)
ALK PHOS: 53 U/L (ref 38–126)
ALT: 21 U/L (ref 14–54)
ANION GAP: 11 (ref 5–15)
AST: 32 U/L (ref 15–41)
BILIRUBIN TOTAL: 0.5 mg/dL (ref 0.3–1.2)
BUN: 17 mg/dL (ref 6–20)
CALCIUM: 7.9 mg/dL — AB (ref 8.9–10.3)
CO2: 24 mmol/L (ref 22–32)
Chloride: 94 mmol/L — ABNORMAL LOW (ref 101–111)
Creatinine, Ser: 0.78 mg/dL (ref 0.44–1.00)
GFR calc Af Amer: >60 mL/min (ref >60)
GLUCOSE: 157 mg/dL — AB (ref 65–99)
POTASSIUM: 3.2 mmol/L — AB (ref 3.5–5.1)
Sodium: 129 mmol/L — ABNORMAL LOW (ref 135–145)
TOTAL PROTEIN: 7 g/dL (ref 6.5–8.1)

## 2014-12-11 LAB — CBC
HCT: 31.7 % — ABNORMAL LOW (ref 36.0–46.0)
Hemoglobin: 10.7 g/dL — ABNORMAL LOW (ref 12.0–15.0)
MCH: 25.7 pg — ABNORMAL LOW (ref 26.0–34.0)
MCHC: 33.8 g/dL (ref 30.0–36.0)
MCV: 76 fL — ABNORMAL LOW (ref 78.0–100.0)
PLATELETS: 146 10*3/uL — AB (ref 150–400)
RBC: 4.17 MIL/uL (ref 3.87–5.11)
RDW: 14.1 % (ref 11.5–15.5)
WBC: 3.8 10*3/uL — AB (ref 4.0–10.5)

## 2014-12-11 LAB — LIPID PANEL
CHOLESTEROL: 172 mg/dL (ref 0–200)
HDL: 43 mg/dL (ref >40)
LDL CALC: 122 mg/dL — AB (ref 0–99)
TRIGLYCERIDES: 36 mg/dL (ref <150)
Total CHOL/HDL Ratio: 4 RATIO
VLDL: 7 mg/dL (ref 0–40)

## 2014-12-11 LAB — INFLUENZA PANEL BY PCR (TYPE A & B)
H1N1FLUPCR: NOT DETECTED
INFLAPCR: NEGATIVE
Influenza B By PCR: NEGATIVE

## 2014-12-11 MED ORDER — MECLIZINE HCL 12.5 MG PO TABS
12.5000 mg | ORAL_TABLET | Freq: Two times a day (BID) | ORAL | Status: DC | PRN
  Administered 2014-12-11: 12.5 mg via ORAL
  Filled 2014-12-11: qty 1

## 2014-12-11 NOTE — Progress Notes (Signed)
[facility] PROGRESS NOTE  Erin Patterson ZOX:[id number] DOB: [birth year] DOA: 12/10/2014 PCP: No primary care provider on file.  Assessment/Plan: Active Problems:   Headache - s/p LP which has been negative - Will continue antibiotic therapy and f/u with culture results    Fever - await culture results. Continue antibiotic regimen for now    Vision loss - no further complaints reported to me regarding vision loss - Consulted neurology to see if patient warranted further work up from their standpoint.    HLD (hyperlipidemia) - stable on lipitor   Code Status:full   Consultants:  Neurology  Procedures:  Radiology  Antibiotics:  Ampicillin  Rocephin  Vancomycin  HPI/Subjective: Was unable to speak to patient due to translator not being available.  Objective: Filed Vitals:   12/11/14 1017  BP: 116/83  Pulse:   Temp:   Resp: 20    Intake/Output Summary (Last 24 hours) at 12/11/14 1415 Last data filed at 12/10/14 1819  Gross per 24 hour  Intake      0 ml  Output    400 ml  Net   -400 ml   Filed Weights   12/10/14 1243  Weight: 49.896 kg (110 lb)    Exam:   General:  Pt in nad   Cardiovascular: no cyanosis  Respiratory: no increased wob, no wheezes  Abdomen: soft, ND  Musculoskeletal: no cyanosis or clubbing   Data Reviewed: Basic Metabolic Panel:  Recent Labs Lab 12/10/14 1215 12/11/14 0405  NA 131* 129*  K 3.4* 3.2*  CL 97* 94*  CO2 24 24  GLUCOSE 127* 157*  BUN 24* 17  CREATININE 1.02* 0.78  CALCIUM 8.7* 7.9*   Liver Function Tests:  Recent Labs Lab 12/10/14 1215 12/11/14 0405  AST 39 32  ALT 25 21  ALKPHOS 60 53  BILITOT 0.9 0.5  PROT 8.4* 7.0  ALBUMIN 3.6 2.9*   No results for input(s): LIPASE, AMYLASE in the last 168 hours. No results for input(s): AMMONIA in the last 168 hours. CBC:  Recent Labs Lab 12/10/14 1215 12/11/14 0405  WBC 7.9 3.8*  NEUTROABS 6.6  --   HGB 10.6* 10.7*  HCT 31.2* 31.7*   MCV 76.3* 76.0*  PLT 137* 146*   Cardiac Enzymes: No results for input(s): CKTOTAL, CKMB, CKMBINDEX, TROPONINI in the last 168 hours. BNP (last 3 results) No results for input(s): BNP in the last 8760 hours.  ProBNP (last 3 results) No results for input(s): PROBNP in the last 8760 hours.  CBG: No results for input(s): GLUCAP in the last 168 hours.  Recent Results (from the past 240 hour(s))  Urine culture     Status: None (Preliminary result)   Collection Time: 12/10/14 12:25 PM  Result Value Ref Range Status   Specimen Description URINE, CATHETERIZED  Final   Special Requests NONE  Final   Culture NO GROWTH < 24 HOURS  Final   Report Status PENDING  Incomplete  CSF culture     Status: None (Preliminary result)   Collection Time: 12/10/14  5:29 PM  Result Value Ref Range Status   Specimen Description CSF  Final   Special Requests NONE  Final   Gram Stain   Final    WBC PRESENT, PREDOMINANTLY MONONUCLEAR NO ORGANISMS SEEN CONFIRMED BY K.WOOTEN    Culture NO GROWTH < 24 HOURS  Final   Report Status PENDING  Incomplete     Studies: Ct Head Wo Contrast  12/10/2014  CLINICAL DATA:  Dizziness, visual  changes, headache for 3 days EXAM: CT HEAD WITHOUT CONTRAST TECHNIQUE: Contiguous axial images were obtained from the base of the skull through the vertex without intravenous contrast. COMPARISON:  None. FINDINGS: No intracranial hemorrhage, mass effect or midline shift. Atherosclerotic calcifications of carotid siphon bilaterally. Mild mucosal thickening left sphenoid sinus. Mild cerebral atrophy. No intracranial hemorrhage, mass effect or midline shift. Bilateral basal ganglia small lacunar infarcts are noted. No definite acute cortical infarction. No mass lesion is noted on this unenhanced scan. IMPRESSION: No acute intracranial abnormality. Mild cerebral atrophy. Atherosclerotic calcifications of carotid siphon. Bilateral basal ganglia small lacunar infarcts are noted. No definite  acute cortical infarction. Electronically Signed   By: Natasha Mead M.D.   On: 12/10/2014 13:36   Dg Chest Port 1 View  12/10/2014  CLINICAL DATA:  Headache, dizziness EXAM: PORTABLE CHEST 1 VIEW COMPARISON:  05/08/2012 FINDINGS: Mild left basilar opacity, likely atelectasis. Possible small left pleural effusion. No pleural effusion or pneumothorax. The heart is normal in size. IMPRESSION: Mild left basilar opacity, likely atelectasis. Possible small left pleural effusion. Electronically Signed   By: Charline Bills M.D.   On: 12/10/2014 12:16   Dg Lumbar Puncture Fluoro Guide  12/10/2014  CLINICAL DATA:  Headache and fever. EXAM: DIAGNOSTIC LUMBAR PUNCTURE UNDER FLUOROSCOPIC GUIDANCE FLUOROSCOPY TIME:  Fluoroscopy Time (in minutes and seconds): 3 minutes and 4 second Number of Acquired Images:  None PROCEDURE: Informed consent was obtained from the patient prior to the procedure, including potential complications of headache, allergy, and pain. With the patient prone, the lower back was prepped with Betadine. 1% Lidocaine was used for local anesthesia. Lumbar puncture was performed at the L2-3 level using a 9 cm needle with return of clear CSF with an opening pressure of less than 12 cm cm water. 8 ml of CSF were obtained for laboratory studies. The patient tolerated the procedure well and there were no apparent complications. IMPRESSION: Uncomplicated lumbar puncture, as detailed above. Electronically Signed   By: Jeronimo Greaves M.D.   On: 12/10/2014 17:03    Scheduled Meds: . ampicillin (OMNIPEN) IV  2 g Intravenous 3 times per day  . atorvastatin  20 mg Oral Daily  . cefTRIAXone (ROCEPHIN)  IV  2 g Intravenous Q12H  . diltiazem  180 mg Oral Daily  . enoxaparin (LOVENOX) injection  30 mg Subcutaneous Q24H  . hydrochlorothiazide  12.5 mg Oral Daily  . lisinopril  20 mg Oral Daily  .  morphine injection  2 mg Intravenous Once  . predniSONE  60 mg Oral Q breakfast  . vancomycin  750 mg Intravenous  Q24H   Continuous Infusions: . sodium chloride       Time spent: > 20 minutes    Penny Pia  [facility] Pager [phone] If 7PM-7AM, please contact night-coverage at [email or web address], password [facility] 12/11/2014, 2:15 PM  LOS: 1 day

## 2014-12-11 NOTE — Consult Note (Signed)
Admission H&P    Chief Complaint: Headache, blurred vision and low-grade fever.  HPI: Erin Patterson is an 81 y.o. female history of myocardial infarction, hypertension and hyperlipidemia brought to the emergency room with complaint of headache and blurred vision as well as low-grade fever for 3 days. Patient was felt to be somewhat old for history and and does not speak [place], requiring translation. Patient also reported feeling fatigued and having chills as well as shortness of breath. History was somewhat inconsistent at times. CT scan of her head showed no acute intracranial abnormality. Lumbar puncture was slightly traumatic with 3 WBCs in tube 1 and 1 WBC in tube 4. CSF glucose and protein were normal. Sedimentation rate was 56. Patient had a number of family friends visiting at the time of this evaluation somewhat which spoke [place] well and translated for Erin Patterson. Patient is no longer having headache and is complaining of pain involving her mouth and chin. She's also not complaining of blurred vision at this point. She was started on Rocephin, ampicillin and vancomycin at the time of admission.  Past Medical History  Diagnosis Date  . MI (myocardial infarction)   . Hypertension   . Hypercholesteremia     No past surgical history on file.  No family history on file. Social History:  has no tobacco, alcohol, and drug history on file.  Allergies: No Known Allergies  Medications Prior to Admission  Medication Sig Dispense Refill  . atorvastatin (LIPITOR) 20 MG tablet Take 20 mg by mouth daily.    Marland Kitchen diltiazem (CARTIA XT) 180 MG 24 hr capsule Take 180 mg by mouth daily.    . hydrochlorothiazide (HYDRODIURIL) 12.5 MG tablet Take 12.5 mg by mouth daily.    Marland Kitchen lisinopril (PRINIVIL,ZESTRIL) 20 MG tablet Take 20 mg by mouth daily.      ROS: History obtained from chart review  General ROS: As noted in present illness Psychological ROS: negative for - behavioral disorder,  hallucinations, memory difficulties, mood swings or suicidal ideation Ophthalmic ROS: negative for - blurry vision, double vision, eye pain or loss of vision ENT ROS: negative for - epistaxis, nasal discharge, oral lesions, sore throat, tinnitus or vertigo Allergy and Immunology ROS: negative for - hives or itchy/watery eyes Hematological and Lymphatic ROS: negative for - bleeding problems, bruising or swollen lymph nodes Endocrine ROS: negative for - galactorrhea, hair pattern changes, polydipsia/polyuria or temperature intolerance Respiratory ROS: negative for - cough, hemoptysis, shortness of breath or wheezing Cardiovascular ROS: negative for - chest pain, dyspnea on exertion, edema or irregular heartbeat Gastrointestinal ROS: negative for - abdominal pain, diarrhea, hematemesis, nausea/vomiting or stool incontinence Genito-Urinary ROS: negative for - dysuria, hematuria, incontinence or urinary frequency/urgency Musculoskeletal ROS: negative for - joint swelling or muscular weakness Neurological ROS: as noted in HPI Dermatological ROS: negative for rash and skin lesion changes  Physical Examination: Blood pressure 116/83, pulse 70, temperature 99.3 F (37.4 C), temperature source Oral, resp. rate 20, height 4' 10" (1.473 m), weight 49.896 kg (110 lb), SpO2 98 %.  HEENT-  Normocephalic, no lesions, without obvious abnormality.  Normal external eye and conjunctiva.  Normal TM's bilaterally.  Normal auditory canals and external ears. Normal external nose, mucus membranes and septum.  Normal pharynx. Neck supple with no masses, nodes, nodules or enlargement. Cardiovascular - regular rate and rhythm, S1, S2 normal, no murmur, click, rub or gallop Lungs - chest clear, no wheezing, rales, normal symmetric air entry Abdomen - soft, non-tender; bowel sounds normal; no  masses,  no organomegaly Extremities - no joint deformities, effusion, or inflammation and no edema  Neurologic  Examination: Mental Status: Alert, oriented, thought content appropriate.  Speech fluent without evidence of aphasia. Able to follow commands without difficulty. Cranial Nerves: II-Visual fields were normal. III/IV/VI-Pupils were equal and reacted normally to light. Moderately dense left cataract noted. Extraocular movements were full and conjugate.    V/VII-no facial numbness and no facial weakness. VIII-normal. X-normal speech and symmetrical palatal movement. XI: trapezius strength/neck flexion strength normal bilaterally XII-midline tongue extension with normal strength. Motor: 5/5 bilaterally with normal tone and bulk Sensory: Normal throughout. Deep Tendon Reflexes: 1+ and symmetric. Plantars: Mute bilaterally Cerebellar: Normal finger-to-nose testing. Carotid auscultation: Normal Meningeal signs: Negative  Results for orders placed or performed during the hospital encounter of 12/10/14 (from the past 48 hour(s))  I-Stat CG4 Lactic Acid, ED     Status: None   Collection Time: 12/10/14 12:07 PM  Result Value Ref Range   Lactic Acid, Venous 0.78 0.5 - 2.0 mmol/L  CBC with Differential/Platelet     Status: Abnormal   Collection Time: 12/10/14 12:15 PM  Result Value Ref Range   WBC 7.9 4.0 - 10.5 K/uL   RBC 4.09 3.87 - 5.11 MIL/uL   Hemoglobin 10.6 (L) 12.0 - 15.0 g/dL   HCT 31.2 (L) 36.0 - 46.0 %   MCV 76.3 (L) 78.0 - 100.0 fL   MCH 25.9 (L) 26.0 - 34.0 pg   MCHC 34.0 30.0 - 36.0 g/dL   RDW 14.3 11.5 - 15.5 %   Platelets 137 (L) 150 - 400 K/uL   Neutrophils Relative % 83 %   Neutro Abs 6.6 1.7 - 7.7 K/uL   Lymphocytes Relative 8 %   Lymphs Abs 0.6 (L) 0.7 - 4.0 K/uL   Monocytes Relative 9 %   Monocytes Absolute 0.7 0.1 - 1.0 K/uL   Eosinophils Relative 0 %   Eosinophils Absolute 0.0 0.0 - 0.7 K/uL   Basophils Relative 0 %   Basophils Absolute 0.0 0.0 - 0.1 K/uL  Comprehensive metabolic panel     Status: Abnormal   Collection Time: 12/10/14 12:15 PM  Result Value Ref  Range   Sodium 131 (L) 135 - 145 mmol/L   Potassium 3.4 (L) 3.5 - 5.1 mmol/L   Chloride 97 (L) 101 - 111 mmol/L   CO2 24 22 - 32 mmol/L   Glucose, Bld 127 (H) 65 - 99 mg/dL   BUN 24 (H) 6 - 20 mg/dL   Creatinine, Ser 1.02 (H) 0.44 - 1.00 mg/dL   Calcium 8.7 (L) 8.9 - 10.3 mg/dL   Total Protein 8.4 (H) 6.5 - 8.1 g/dL   Albumin 3.6 3.5 - 5.0 g/dL   AST 39 15 - 41 U/L   ALT 25 14 - 54 U/L   Alkaline Phosphatase 60 38 - 126 U/L   Total Bilirubin 0.9 0.3 - 1.2 mg/dL   GFR calc non Af Amer 51 (L) >60 mL/min   GFR calc Af Amer 59 (L) >60 mL/min    Comment: (NOTE) The eGFR has been calculated using the CKD EPI equation. This calculation has not been validated in all clinical situations. eGFR's persistently <60 mL/min signify possible Chronic Kidney Disease.    Anion gap 10 5 - 15  Urinalysis, Routine w reflex microscopic (not at [facility])     Status: Abnormal   Collection Time: 12/10/14 12:25 PM  Result Value Ref Range   Color, Urine YELLOW YELLOW   APPearance  CLEAR CLEAR   Specific Gravity, Urine 1.015 1.005 - 1.030   pH 6.0 5.0 - 8.0   Glucose, UA NEGATIVE NEGATIVE mg/dL   Hgb urine dipstick NEGATIVE NEGATIVE   Bilirubin Urine NEGATIVE NEGATIVE   Ketones, ur 15 (A) NEGATIVE mg/dL   Protein, ur NEGATIVE NEGATIVE mg/dL   Urobilinogen, UA 1.0 0.0 - 1.0 mg/dL   Nitrite NEGATIVE NEGATIVE   Leukocytes, UA NEGATIVE NEGATIVE    Comment: MICROSCOPIC NOT DONE ON URINES WITH NEGATIVE PROTEIN, BLOOD, LEUKOCYTES, NITRITE, OR GLUCOSE <1000 mg/dL.  Urine culture     Status: None (Preliminary result)   Collection Time: 12/10/14 12:25 PM  Result Value Ref Range   Specimen Description URINE, CATHETERIZED    Special Requests NONE    Culture NO GROWTH < 24 HOURS    Report Status PENDING   I-Stat CG4 Lactic Acid, ED  (not at  [facility])     Status: None   Collection Time: 12/10/14  3:30 PM  Result Value Ref Range   Lactic Acid, Venous 0.79 0.5 - 2.0 mmol/L  Glucose, CSF     Status: None   Collection  Time: 12/10/14  5:14 PM  Result Value Ref Range   Glucose, CSF 60 40 - 70 mg/dL  Protein, CSF     Status: None   Collection Time: 12/10/14  5:14 PM  Result Value Ref Range   Total  Protein, CSF 21 15 - 45 mg/dL  CSF cell count with differential     Status: Abnormal   Collection Time: 12/10/14  5:27 PM  Result Value Ref Range   Tube # 1    Color, CSF COLORLESS COLORLESS   Appearance, CSF CLEAR (A) CLEAR   Supernatant NOT INDICATED    RBC Count, CSF 445 (H) 0 /cu mm   WBC, CSF 1 0 - 5 /cu mm   Other Cells, CSF TOO FEW TO COUNT, SMEAR AVAILABLE FOR REVIEW     Comment: RARE MONO, RARE LYMPH, RARE SEG PRESENT  CSF culture     Status: None (Preliminary result)   Collection Time: 12/10/14  5:29 PM  Result Value Ref Range   Specimen Description CSF    Special Requests NONE    Gram Stain      WBC PRESENT, PREDOMINANTLY MONONUCLEAR NO ORGANISMS SEEN CONFIRMED BY K.WOOTEN    Culture NO GROWTH < 24 HOURS    Report Status PENDING   CSF cell count with differential collection tube #: 1     Status: Abnormal   Collection Time: 12/10/14  5:31 PM  Result Value Ref Range   Tube # 4     Comment: CALLED TO KAREN COBB, RN 1912 [phone] CORRECTED ON 10/15 AT 1911: PREVIOUSLY REPORTED AS 1    Color, CSF COLORLESS COLORLESS   Appearance, CSF CLEAR CLEAR   Supernatant NOT INDICATED    RBC Count, CSF 256 (H) 0 /cu mm   WBC, CSF 3 0 - 5 /cu mm   Segmented Neutrophils-CSF TOO FEW TO COUNT, SMEAR AVAILABLE FOR REVIEW 0 - 6 %    Comment: RARE   Lymphs, CSF RARE 40 - 80 %   Monocyte-Macrophage-Spinal Fluid RARE 15 - 45 %  Sedimentation rate     Status: Abnormal   Collection Time: 12/10/14  7:58 PM  Result Value Ref Range   Sed Rate 56 (H) 0 - 22 mm/hr  C-reactive protein     Status: Abnormal   Collection Time: 12/10/14  7:58 PM  Result Value Ref  Range   CRP 2.0 (H) <1.0 mg/dL  TSH     Status: Abnormal   Collection Time: 12/10/14  7:58 PM  Result Value Ref Range   TSH 0.330 (L) 0.350 - 4.500  uIU/mL  Influenza panel by PCR (type A & B, H1N1)     Status: None   Collection Time: 12/10/14  8:11 PM  Result Value Ref Range   Influenza A By PCR NEGATIVE NEGATIVE   Influenza B By PCR NEGATIVE NEGATIVE   H1N1 flu by pcr NOT DETECTED NOT DETECTED    Comment:        The Xpert Flu assay (FDA approved for nasal aspirates or washes and nasopharyngeal swab specimens), is intended as an aid in the diagnosis of influenza and should not be used as a sole basis for treatment.   CBC     Status: Abnormal   Collection Time: 12/11/14  4:05 AM  Result Value Ref Range   WBC 3.8 (L) 4.0 - 10.5 K/uL   RBC 4.17 3.87 - 5.11 MIL/uL   Hemoglobin 10.7 (L) 12.0 - 15.0 g/dL   HCT 31.7 (L) 36.0 - 46.0 %   MCV 76.0 (L) 78.0 - 100.0 fL   MCH 25.7 (L) 26.0 - 34.0 pg   MCHC 33.8 30.0 - 36.0 g/dL   RDW 14.1 11.5 - 15.5 %   Platelets 146 (L) 150 - 400 K/uL  Comprehensive metabolic panel     Status: Abnormal   Collection Time: 12/11/14  4:05 AM  Result Value Ref Range   Sodium 129 (L) 135 - 145 mmol/L   Potassium 3.2 (L) 3.5 - 5.1 mmol/L   Chloride 94 (L) 101 - 111 mmol/L   CO2 24 22 - 32 mmol/L   Glucose, Bld 157 (H) 65 - 99 mg/dL   BUN 17 6 - 20 mg/dL   Creatinine, Ser 0.78 0.44 - 1.00 mg/dL   Calcium 7.9 (L) 8.9 - 10.3 mg/dL   Total Protein 7.0 6.5 - 8.1 g/dL   Albumin 2.9 (L) 3.5 - 5.0 g/dL   AST 32 15 - 41 U/L   ALT 21 14 - 54 U/L   Alkaline Phosphatase 53 38 - 126 U/L   Total Bilirubin 0.5 0.3 - 1.2 mg/dL   GFR calc non Af Amer >60 >60 mL/min   GFR calc Af Amer >60 >60 mL/min    Comment: (NOTE) The eGFR has been calculated using the CKD EPI equation. This calculation has not been validated in all clinical situations. eGFR's persistently <60 mL/min signify possible Chronic Kidney Disease.    Anion gap 11 5 - 15  Lipid panel     Status: Abnormal   Collection Time: 12/11/14  4:05 AM  Result Value Ref Range   Cholesterol 172 0 - 200 mg/dL   Triglycerides 36 <150 mg/dL   HDL 43 >40  mg/dL   Total CHOL/HDL Ratio 4.0 RATIO   VLDL 7 0 - 40 mg/dL   LDL Cholesterol 122 (H) 0 - 99 mg/dL    Comment:        Total Cholesterol/HDL:CHD Risk Coronary Heart Disease Risk Table                     Men   Women  1/2 Average Risk   3.4   3.3  Average Risk       5.0   4.4  2 X Average Risk   9.6   7.1  3 X Average Risk  23.4   11.0        Use the calculated Patient Ratio above and the CHD Risk Table to determine the patient's CHD Risk.        ATP III CLASSIFICATION (LDL):  <100     mg/dL   Optimal  100-129  mg/dL   Near or Above                    Optimal  130-159  mg/dL   Borderline  160-189  mg/dL   High  >190     mg/dL   Very High    Ct Head Wo Contrast  12/10/2014  CLINICAL DATA:  Dizziness, visual changes, headache for 3 days EXAM: CT HEAD WITHOUT CONTRAST TECHNIQUE: Contiguous axial images were obtained from the base of the skull through the vertex without intravenous contrast. COMPARISON:  None. FINDINGS: No intracranial hemorrhage, mass effect or midline shift. Atherosclerotic calcifications of carotid siphon bilaterally. Mild mucosal thickening left sphenoid sinus. Mild cerebral atrophy. No intracranial hemorrhage, mass effect or midline shift. Bilateral basal ganglia small lacunar infarcts are noted. No definite acute cortical infarction. No mass lesion is noted on this unenhanced scan. IMPRESSION: No acute intracranial abnormality. Mild cerebral atrophy. Atherosclerotic calcifications of carotid siphon. Bilateral basal ganglia small lacunar infarcts are noted. No definite acute cortical infarction. Electronically Signed   By: Lahoma Crocker M.D.   On: 12/10/2014 13:36   Dg Chest Port 1 View  12/10/2014  CLINICAL DATA:  Headache, dizziness EXAM: PORTABLE CHEST 1 VIEW COMPARISON:  05/08/2012 FINDINGS: Mild left basilar opacity, likely atelectasis. Possible small left pleural effusion. No pleural effusion or pneumothorax. The heart is normal in size. IMPRESSION: Mild left  basilar opacity, likely atelectasis. Possible small left pleural effusion. Electronically Signed   By: Julian Hy M.D.   On: 12/10/2014 12:16   Dg Lumbar Puncture Fluoro Guide  12/10/2014  CLINICAL DATA:  Headache and fever. EXAM: DIAGNOSTIC LUMBAR PUNCTURE UNDER FLUOROSCOPIC GUIDANCE FLUOROSCOPY TIME:  Fluoroscopy Time (in minutes and seconds): 3 minutes and 4 second Number of Acquired Images:  None PROCEDURE: Informed consent was obtained from the patient prior to the procedure, including potential complications of headache, allergy, and pain. With the patient prone, the lower back was prepped with Betadine. 1% Lidocaine was used for local anesthesia. Lumbar puncture was performed at the L2-3 level using a 9 cm needle with return of clear CSF with an opening pressure of less than 12 cm cm water. 8 ml of CSF were obtained for laboratory studies. The patient tolerated the procedure well and there were no apparent complications. IMPRESSION: Uncomplicated lumbar puncture, as detailed above. Electronically Signed   By: Abigail Miyamoto M.D.   On: 12/10/2014 17:03    Assessment/Plan 81 year old lady with hypertension, hyperlipidemia and history of myocardial infarction, presenting with headache and blurred vision for 3 days. Etiology is unclear. Patient had low-grade fever as well. Headache is no longer present and patient is no longer complaining of blurred vision. She has no clinical signs of meningitis nor CSF abnormalities indicative of meningeal irritation.  No further neurodiagnostic studies are indicated at this point. We will plan to see this patient follow-up on an as-needed basis during this hospital stay.  C.R. Nicole Kindred, MD [facility] [phone]  12/11/2014, 2:19 PM

## 2014-12-11 NOTE — Progress Notes (Signed)
Patient c/o dizziness, meclizine administered, patient states dizziness has gone away a little when laying, but states dizziness is persistent when standing and ambulating. RN reviewed pta bp meds with patient, medications reconciled. Orthostatics charted in assessments. MD aware.

## 2014-12-12 DIAGNOSIS — R509 Fever, unspecified: Secondary | ICD-10-CM

## 2014-12-12 DIAGNOSIS — E785 Hyperlipidemia, unspecified: Secondary | ICD-10-CM

## 2014-12-12 DIAGNOSIS — E876 Hypokalemia: Secondary | ICD-10-CM

## 2014-12-12 LAB — URINE CULTURE: CULTURE: NO GROWTH

## 2014-12-12 MED ORDER — SENNA 8.6 MG PO TABS
1.0000 | ORAL_TABLET | Freq: Every day | ORAL | Status: DC | PRN
  Filled 2014-12-12: qty 1

## 2014-12-12 MED ORDER — POTASSIUM CHLORIDE CRYS ER 20 MEQ PO TBCR
20.0000 meq | EXTENDED_RELEASE_TABLET | Freq: Once | ORAL | Status: AC
  Administered 2014-12-12: 20 meq via ORAL
  Filled 2014-12-12: qty 1

## 2014-12-12 MED ORDER — POTASSIUM CHLORIDE CRYS ER 20 MEQ PO TBCR
40.0000 meq | EXTENDED_RELEASE_TABLET | Freq: Once | ORAL | Status: AC
  Administered 2014-12-12: 40 meq via ORAL
  Filled 2014-12-12: qty 2

## 2014-12-12 NOTE — Progress Notes (Signed)
[facility] PROGRESS NOTE  Erin Patterson ZOX:[id number] DOB: [birth year] DOA: 12/10/2014 PCP: No primary care provider on file.  Assessment/Plan: Active Problems:   Headache - s/p LP which has been negative for meningitis as such will discontinue antibiotics. CSF culture reports no organisms seen and no growth - Monitor another 24 hours off antibiotics and if afebrile and no continued resolution of headache will plan on discharging next am. - CT of head results listed no acute intracranial abnormality    Fever - Influenza panel negative - Could be from viral etiology. Has resolved - monitor off antibiotics. - Blood and urine culture reports no growth.    Vision loss - no further complaints reported to me regarding vision loss - Consulted neurology and no further neurological work up recommended.    HLD (hyperlipidemia) - stable on lipitor  Hypokalemia - replace orally - reassess next am.   Code Status:full   Consultants:  Neurology  Procedures:  Radiology  Antibiotics:  Ampicillin  Rocephin  Vancomycin  HPI/Subjective: Patient feels better. Still having some headache  Objective: Filed Vitals:   12/12/14 0633  BP: 126/74  Pulse: 70  Temp: 97.9 F (36.6 C)  Resp: 18   No intake or output data in the 24 hours ending 12/12/14 0937 Filed Weights   12/10/14 1243  Weight: 49.896 kg (110 lb)    Exam:   General:  Pt in nad   Cardiovascular: no cyanosis  Respiratory: no increased wob, no wheezes  Abdomen: soft, ND  Musculoskeletal: no cyanosis or clubbing   Data Reviewed: Basic Metabolic Panel:  Recent Labs Lab 12/10/14 1215 12/11/14 0405  NA 131* 129*  K 3.4* 3.2*  CL 97* 94*  CO2 24 24  GLUCOSE 127* 157*  BUN 24* 17  CREATININE 1.02* 0.78  CALCIUM 8.7* 7.9*   Liver Function Tests:  Recent Labs Lab 12/10/14 1215 12/11/14 0405  AST 39 32  ALT 25 21  ALKPHOS 60 53  BILITOT 0.9 0.5  PROT 8.4* 7.0  ALBUMIN 3.6 2.9*    No results for input(s): LIPASE, AMYLASE in the last 168 hours. No results for input(s): AMMONIA in the last 168 hours. CBC:  Recent Labs Lab 12/10/14 1215 12/11/14 0405  WBC 7.9 3.8*  NEUTROABS 6.6  --   HGB 10.6* 10.7*  HCT 31.2* 31.7*  MCV 76.3* 76.0*  PLT 137* 146*   Cardiac Enzymes: No results for input(s): CKTOTAL, CKMB, CKMBINDEX, TROPONINI in the last 168 hours. BNP (last 3 results) No results for input(s): BNP in the last 8760 hours.  ProBNP (last 3 results) No results for input(s): PROBNP in the last 8760 hours.  CBG: No results for input(s): GLUCAP in the last 168 hours.  Recent Results (from the past 240 hour(s))  Blood Culture (routine x 2)     Status: None (Preliminary result)   Collection Time: 12/10/14 11:56 AM  Result Value Ref Range Status   Specimen Description BLOOD RIGHT ARM  Final   Special Requests BOTTLES DRAWN AEROBIC AND ANAEROBIC 5CC  Final   Culture NO GROWTH 1 DAY  Final   Report Status PENDING  Incomplete  Urine culture     Status: None (Preliminary result)   Collection Time: 12/10/14 12:25 PM  Result Value Ref Range Status   Specimen Description URINE, CATHETERIZED  Final   Special Requests NONE  Final   Culture NO GROWTH < 24 HOURS  Final   Report Status PENDING  Incomplete  Blood Culture (routine x 2)  Status: None (Preliminary result)   Collection Time: 12/10/14 12:45 PM  Result Value Ref Range Status   Specimen Description BLOOD LEFT FOREARM  Final   Special Requests BOTTLES DRAWN AEROBIC AND ANAEROBIC 5CC  Final   Culture NO GROWTH 1 DAY  Final   Report Status PENDING  Incomplete  CSF culture     Status: None (Preliminary result)   Collection Time: 12/10/14  5:29 PM  Result Value Ref Range Status   Specimen Description CSF  Final   Special Requests NONE  Final   Gram Stain   Final    WBC PRESENT, PREDOMINANTLY MONONUCLEAR NO ORGANISMS SEEN CONFIRMED BY K.WOOTEN    Culture NO GROWTH < 24 HOURS  Final   Report Status  PENDING  Incomplete     Studies: Ct Head Wo Contrast  12/10/2014  CLINICAL DATA:  Dizziness, visual changes, headache for 3 days EXAM: CT HEAD WITHOUT CONTRAST TECHNIQUE: Contiguous axial images were obtained from the base of the skull through the vertex without intravenous contrast. COMPARISON:  None. FINDINGS: No intracranial hemorrhage, mass effect or midline shift. Atherosclerotic calcifications of carotid siphon bilaterally. Mild mucosal thickening left sphenoid sinus. Mild cerebral atrophy. No intracranial hemorrhage, mass effect or midline shift. Bilateral basal ganglia small lacunar infarcts are noted. No definite acute cortical infarction. No mass lesion is noted on this unenhanced scan. IMPRESSION: No acute intracranial abnormality. Mild cerebral atrophy. Atherosclerotic calcifications of carotid siphon. Bilateral basal ganglia small lacunar infarcts are noted. No definite acute cortical infarction. Electronically Signed   By: Natasha Mead M.D.   On: 12/10/2014 13:36   Dg Chest Port 1 View  12/10/2014  CLINICAL DATA:  Headache, dizziness EXAM: PORTABLE CHEST 1 VIEW COMPARISON:  05/08/2012 FINDINGS: Mild left basilar opacity, likely atelectasis. Possible small left pleural effusion. No pleural effusion or pneumothorax. The heart is normal in size. IMPRESSION: Mild left basilar opacity, likely atelectasis. Possible small left pleural effusion. Electronically Signed   By: Charline Bills M.D.   On: 12/10/2014 12:16   Dg Lumbar Puncture Fluoro Guide  12/10/2014  CLINICAL DATA:  Headache and fever. EXAM: DIAGNOSTIC LUMBAR PUNCTURE UNDER FLUOROSCOPIC GUIDANCE FLUOROSCOPY TIME:  Fluoroscopy Time (in minutes and seconds): 3 minutes and 4 second Number of Acquired Images:  None PROCEDURE: Informed consent was obtained from the patient prior to the procedure, including potential complications of headache, allergy, and pain. With the patient prone, the lower back was prepped with Betadine. 1% Lidocaine  was used for local anesthesia. Lumbar puncture was performed at the L2-3 level using a 9 cm needle with return of clear CSF with an opening pressure of less than 12 cm cm water. 8 ml of CSF were obtained for laboratory studies. The patient tolerated the procedure well and there were no apparent complications. IMPRESSION: Uncomplicated lumbar puncture, as detailed above. Electronically Signed   By: Jeronimo Greaves M.D.   On: 12/10/2014 17:03    Scheduled Meds: . atorvastatin  20 mg Oral Daily  . diltiazem  180 mg Oral Daily  . enoxaparin (LOVENOX) injection  30 mg Subcutaneous Q24H  . hydrochlorothiazide  12.5 mg Oral Daily  . lisinopril  20 mg Oral Daily  .  morphine injection  2 mg Intravenous Once  . potassium chloride  40 mEq Oral Once  . predniSONE  60 mg Oral Q breakfast   Continuous Infusions: . sodium chloride 100 mL/hr at 12/12/14 0010     Time spent: > 20 minutes    Gabrille Kilbride, [employer]  [facility] Pager [phone] If 7PM-7AM, please contact night-coverage at [email or web address], password [facility] 12/12/2014, 9:37 AM  LOS: 2 days

## 2014-12-12 NOTE — Care Management Note (Signed)
Case Management Note  Patient Details  Name: Erin Patterson MRN: [id number] Date of Birth: 1932/07/09  Subjective/Objective:                    Action/Plan: Patient admitted with headache, low grade fever and dizziness. Pt lives at home with spouse. Pt is [place] and does not speak [place]. Pt does not have PCP listed but patient has Medicaid and should have an MD assigned. CM will continue to follow for discharge needs.   Expected Discharge Date:  12/12/14               Expected Discharge Plan:  Home/Self Care  In-House Referral:     Discharge planning Services     Post Acute Care Choice:    Choice offered to:     DME Arranged:    DME Agency:     HH Arranged:    HH Agency:     Status of Service:  In process, will continue to follow  Medicare Important Message Given:    Date Medicare IM Given:    Medicare IM give by:    Date Additional Medicare IM Given:    Additional Medicare Important Message give by:     If discussed at Long Length of Stay Meetings, dates discussed:    Additional Comments:  Kermit Balo, RN 12/12/2014, 11:09 AM

## 2014-12-13 LAB — BASIC METABOLIC PANEL
ANION GAP: 6 (ref 5–15)
BUN: 20 mg/dL (ref 6–20)
CHLORIDE: 104 mmol/L (ref 101–111)
CO2: 25 mmol/L (ref 22–32)
Calcium: 8 mg/dL — ABNORMAL LOW (ref 8.9–10.3)
Creatinine, Ser: 0.8 mg/dL (ref 0.44–1.00)
GFR calc Af Amer: >60 mL/min (ref >60)
Glucose, Bld: 136 mg/dL — ABNORMAL HIGH (ref 65–99)
POTASSIUM: 4.1 mmol/L (ref 3.5–5.1)
SODIUM: 135 mmol/L (ref 135–145)

## 2014-12-13 LAB — CSF CULTURE: CULTURE: NO GROWTH

## 2014-12-13 LAB — CSF CULTURE W GRAM STAIN

## 2014-12-13 LAB — HERPES SIMPLEX VIRUS(HSV) DNA BY PCR
HSV 1 DNA: NEGATIVE
HSV 2 DNA: NEGATIVE

## 2014-12-13 NOTE — Care Management Note (Signed)
Case Management Note  Patient Details  Name: Erin Patterson MRN: [id number] Date of Birth: [birth year]  Subjective/Objective:                    Action/Plan: CM met with the patient and her family at the bedside. CM used the interpretor line and asked patient about her PCP. Patient states her PCP is Gara Kroner in [place], [place]. No further needs per CM.   Expected Discharge Date:  12/12/14               Expected Discharge Plan:  Home/Self Care  In-House Referral:     Discharge planning Services     Post Acute Care Choice:    Choice offered to:     DME Arranged:    DME Agency:     HH Arranged:    HH Agency:     Status of Service:  In process, will continue to follow  Medicare Important Message Given:    Date Medicare IM Given:    Medicare IM give by:    Date Additional Medicare IM Given:    Additional Medicare Important Message give by:     If discussed at [facility] of Stay Meetings, dates discussed:    Additional Comments:  Pollie Friar, RN 12/13/2014, 10:38 AM

## 2014-12-13 NOTE — Progress Notes (Signed)
D/C orders received, pt for D/C home today.  IV and telemetry D/C.  Rx and D/C instructions given with verbalized understanding.  Family at bedside to assist with D/C.  Staff brought pt downstairs via wheelchair.  

## 2014-12-13 NOTE — Discharge Summary (Signed)
Physician Discharge Summary  Erin Patterson ZOX:[id number] DOB: 04-22-1932 DOA: 12/10/2014  PCP: No primary care provider on file.  Admit date: 12/10/2014 Discharge date: 12/13/2014  Time spent: > 35 minutes  Recommendations for Outpatient Follow-up:  1. Pt recently febrile with negative work up in hospital. Suspect viral etiology  Discharge Diagnoses:  Active Problems:   Headache   Fever   Vision loss   HLD (hyperlipidemia)   MI (myocardial infarction) (HCC)   CAD (coronary artery disease)   Discharge Condition: stable  Diet recommendation: Heart healthy  Filed Weights   12/10/14 1243  Weight: 49.896 kg (110 lb)    History of present illness:  81 y/o [place] female with history of hypertension, hyperlipidemia, coronary artery disease, previous MI. Who presented to the hospital with fevers  Hospital Course:  Fevers of unknown origin - Most likely viral in etiology. WBC near low normal limits. Patient afebrile on day of discharge sitting up smiling in bed. Requesting discharge - Improved off of antibiotics as such I suspect this is viral etiology. Patient had lumbar puncture which was negative - CT of head reported no acute intracranial abnormality - Should patient develop any further fevers or new problems she is to contact her primary care physician or come to the ER for reevaluation. - Neurology recommended no further workup  We'll continue home medication regimen for other known comorbidities we'll hold hydrochlorothiazide  Procedures:  Lumbar puncture  Consultations:  Neurology  Discharge Exam: Filed Vitals:   12/13/14 0917  BP: 116/70  Pulse: 58  Temp: 98.6 F (37 C)  Resp: 20    General: Patient in no acute distress, alert and awake, sitting up in bed smiling Cardiovascular: Regular rate and rhythm, no murmurs or rubs Respiratory: No increased work of breathing, equal chest rise, no wheezes  Discharge Instructions   Discharge Instructions    Call MD for:  extreme fatigue    Complete by:  As directed      Call MD for:  redness, tenderness, or signs of infection (pain, swelling, redness, odor or green/yellow discharge around incision site)    Complete by:  As directed      Call MD for:  severe uncontrolled pain    Complete by:  As directed      Call MD for:  temperature >100.4    Complete by:  As directed      Diet - low sodium heart healthy    Complete by:  As directed      Discharge instructions    Complete by:  As directed   Please follow up with the optometrist after discharge. Also follow up with your primary care physician in 1-2 weeks or sooner should any new concerns arise. Should you develop any fevers call your primary care physician.     Increase activity slowly    Complete by:  As directed           Current Discharge Medication List    CONTINUE these medications which have NOT CHANGED   Details  atorvastatin (LIPITOR) 20 MG tablet Take 20 mg by mouth daily.    diltiazem (CARTIA XT) 180 MG 24 hr capsule Take 180 mg by mouth daily.    lisinopril (PRINIVIL,ZESTRIL) 20 MG tablet Take 20 mg by mouth daily.      STOP taking these medications     hydrochlorothiazide (HYDRODIURIL) 12.5 MG tablet        No Known Allergies    The results of significant diagnostics  from this hospitalization (including imaging, microbiology, ancillary and laboratory) are listed below for reference.    Significant Diagnostic Studies: Ct Head Wo Contrast  12/10/2014  CLINICAL DATA:  Dizziness, visual changes, headache for 3 days EXAM: CT HEAD WITHOUT CONTRAST TECHNIQUE: Contiguous axial images were obtained from the base of the skull through the vertex without intravenous contrast. COMPARISON:  None. FINDINGS: No intracranial hemorrhage, mass effect or midline shift. Atherosclerotic calcifications of carotid siphon bilaterally. Mild mucosal thickening left sphenoid sinus. Mild cerebral atrophy. No intracranial hemorrhage, mass  effect or midline shift. Bilateral basal ganglia small lacunar infarcts are noted. No definite acute cortical infarction. No mass lesion is noted on this unenhanced scan. IMPRESSION: No acute intracranial abnormality. Mild cerebral atrophy. Atherosclerotic calcifications of carotid siphon. Bilateral basal ganglia small lacunar infarcts are noted. No definite acute cortical infarction. Electronically Signed   By: Natasha Mead M.D.   On: 12/10/2014 13:36   Dg Chest Port 1 View  12/10/2014  CLINICAL DATA:  Headache, dizziness EXAM: PORTABLE CHEST 1 VIEW COMPARISON:  05/08/2012 FINDINGS: Mild left basilar opacity, likely atelectasis. Possible small left pleural effusion. No pleural effusion or pneumothorax. The heart is normal in size. IMPRESSION: Mild left basilar opacity, likely atelectasis. Possible small left pleural effusion. Electronically Signed   By: Charline Bills M.D.   On: 12/10/2014 12:16   Dg Lumbar Puncture Fluoro Guide  12/10/2014  CLINICAL DATA:  Headache and fever. EXAM: DIAGNOSTIC LUMBAR PUNCTURE UNDER FLUOROSCOPIC GUIDANCE FLUOROSCOPY TIME:  Fluoroscopy Time (in minutes and seconds): 3 minutes and 4 second Number of Acquired Images:  None PROCEDURE: Informed consent was obtained from the patient prior to the procedure, including potential complications of headache, allergy, and pain. With the patient prone, the lower back was prepped with Betadine. 1% Lidocaine was used for local anesthesia. Lumbar puncture was performed at the L2-3 level using a 9 cm needle with return of clear CSF with an opening pressure of less than 12 cm cm water. 8 ml of CSF were obtained for laboratory studies. The patient tolerated the procedure well and there were no apparent complications. IMPRESSION: Uncomplicated lumbar puncture, as detailed above. Electronically Signed   By: Jeronimo Greaves M.D.   On: 12/10/2014 17:03    Microbiology: Recent Results (from the past 240 hour(s))  Blood Culture (routine x 2)      Status: None (Preliminary result)   Collection Time: 12/10/14 11:56 AM  Result Value Ref Range Status   Specimen Description BLOOD RIGHT ARM  Final   Special Requests BOTTLES DRAWN AEROBIC AND ANAEROBIC 5CC  Final   Culture NO GROWTH 2 DAYS  Final   Report Status PENDING  Incomplete  Urine culture     Status: None   Collection Time: 12/10/14 12:25 PM  Result Value Ref Range Status   Specimen Description URINE, CATHETERIZED  Final   Special Requests NONE  Final   Culture NO GROWTH 2 DAYS  Final   Report Status 12/12/2014 FINAL  Final  Blood Culture (routine x 2)     Status: None (Preliminary result)   Collection Time: 12/10/14 12:45 PM  Result Value Ref Range Status   Specimen Description BLOOD LEFT FOREARM  Final   Special Requests BOTTLES DRAWN AEROBIC AND ANAEROBIC 5CC  Final   Culture NO GROWTH 2 DAYS  Final   Report Status PENDING  Incomplete  CSF culture     Status: None (Preliminary result)   Collection Time: 12/10/14  5:29 PM  Result Value Ref Range  Status   Specimen Description CSF  Final   Special Requests NONE  Final   Gram Stain   Final    WBC PRESENT, PREDOMINANTLY MONONUCLEAR NO ORGANISMS SEEN CONFIRMED BY K.WOOTEN    Culture NO GROWTH 2 DAYS  Final   Report Status PENDING  Incomplete     Labs: Basic Metabolic Panel:  Recent Labs Lab 12/10/14 1215 12/11/14 0405 12/13/14 0013  NA 131* 129* 135  K 3.4* 3.2* 4.1  CL 97* 94* 104  CO2 GLUCOSE 127* 157* 136*  BUN 24* 17 20  CREATININE 1.02* 0.78 0.80  CALCIUM 8.7* 7.9* 8.0*   Liver Function Tests:  Recent Labs Lab 12/10/14 1215 12/11/14 0405  AST 39 32  ALT 25 21  ALKPHOS 60 53  BILITOT 0.9 0.5  PROT 8.4* 7.0  ALBUMIN 3.6 2.9*   No results for input(s): LIPASE, AMYLASE in the last 168 hours. No results for input(s): AMMONIA in the last 168 hours. CBC:  Recent Labs Lab 12/10/14 1215 12/11/14 0405  WBC 7.9 3.8*  NEUTROABS 6.6  --   HGB 10.6* 10.7*  HCT 31.2* 31.7*  MCV 76.3*  76.0*  PLT 137* 146*   Cardiac Enzymes: No results for input(s): CKTOTAL, CKMB, CKMBINDEX, TROPONINI in the last 168 hours. BNP: BNP (last 3 results) No results for input(s): BNP in the last 8760 hours.  ProBNP (last 3 results) No results for input(s): PROBNP in the last 8760 hours.  CBG: No results for input(s): GLUCAP in the last 168 hours.     Signed:  Penny Pia  [facility] 12/13/2014, 10:18 AM

## 2014-12-15 LAB — CULTURE, BLOOD (ROUTINE X 2)
CULTURE: NO GROWTH
Culture: NO GROWTH

## 2015-05-25 ENCOUNTER — Other Ambulatory Visit: Payer: Self-pay | Admitting: Neurology

## 2015-05-25 DIAGNOSIS — R51 Headache: Principal | ICD-10-CM

## 2015-05-25 DIAGNOSIS — R519 Headache, unspecified: Secondary | ICD-10-CM

## 2015-07-12 ENCOUNTER — Ambulatory Visit
Admission: RE | Admit: 2015-07-12 | Discharge: 2015-07-12 | Disposition: A | Discharge status: Home / Self Care | Payer: Medicaid Other | Source: Ambulatory Visit | Attending: Neurology | Admitting: Neurology

## 2015-07-12 DIAGNOSIS — R51 Headache: Principal | ICD-10-CM

## 2015-07-12 DIAGNOSIS — R519 Headache, unspecified: Secondary | ICD-10-CM

## 2016-08-20 ENCOUNTER — Emergency Department (HOSPITAL_COMMUNITY): Payer: Medicaid Other

## 2016-08-20 ENCOUNTER — Encounter (HOSPITAL_COMMUNITY): Payer: Self-pay | Admitting: Emergency Medicine

## 2016-08-20 ENCOUNTER — Emergency Department (HOSPITAL_COMMUNITY)
Admission: EM | Admit: 2016-08-20 | Discharge: 2016-08-21 | Disposition: A | Discharge status: Home / Self Care | Payer: Medicaid Other | Attending: Emergency Medicine | Admitting: Emergency Medicine

## 2016-08-20 DIAGNOSIS — Z79899 Other long term (current) drug therapy: Secondary | ICD-10-CM | POA: Diagnosis not present

## 2016-08-20 DIAGNOSIS — E876 Hypokalemia: Secondary | ICD-10-CM | POA: Insufficient documentation

## 2016-08-20 DIAGNOSIS — I2510 Atherosclerotic heart disease of native coronary artery without angina pectoris: Secondary | ICD-10-CM | POA: Insufficient documentation

## 2016-08-20 DIAGNOSIS — R5383 Other fatigue: Secondary | ICD-10-CM

## 2016-08-20 DIAGNOSIS — I10 Essential (primary) hypertension: Secondary | ICD-10-CM | POA: Insufficient documentation

## 2016-08-20 NOTE — ED Notes (Signed)
Pt was called with no answer for an assigned room.

## 2016-08-20 NOTE — ED Triage Notes (Signed)
Pt c/o chest pain and SOB x 3 days. Speaks [place]

## 2016-08-20 NOTE — ED Notes (Signed)
Pt was callled for an assigned room with no answer.

## 2016-08-21 LAB — URINALYSIS, ROUTINE W REFLEX MICROSCOPIC
BACTERIA UA: NONE SEEN
BILIRUBIN URINE: NEGATIVE
Glucose, UA: >=500 mg/dL — AB
Hgb urine dipstick: NEGATIVE
KETONES UR: NEGATIVE mg/dL
Nitrite: NEGATIVE
PROTEIN: NEGATIVE mg/dL
SPECIFIC GRAVITY, URINE: 1.005 (ref 1.005–1.030)
pH: 7 (ref 5.0–8.0)

## 2016-08-21 LAB — CBC
HCT: 34.9 % — ABNORMAL LOW (ref 36.0–46.0)
Hemoglobin: 11.8 g/dL — ABNORMAL LOW (ref 12.0–15.0)
MCH: 25.9 pg — AB (ref 26.0–34.0)
MCHC: 33.8 g/dL (ref 30.0–36.0)
MCV: 76.7 fL — ABNORMAL LOW (ref 78.0–100.0)
Platelets: 139 10*3/uL — ABNORMAL LOW (ref 150–400)
RBC: 4.55 MIL/uL (ref 3.87–5.11)
RDW: 16 % — ABNORMAL HIGH (ref 11.5–15.5)
WBC: 5.4 10*3/uL (ref 4.0–10.5)

## 2016-08-21 LAB — BASIC METABOLIC PANEL
ANION GAP: 8 (ref 5–15)
BUN: 15 mg/dL (ref 6–20)
CALCIUM: 8.5 mg/dL — AB (ref 8.9–10.3)
CO2: 25 mmol/L (ref 22–32)
Chloride: 106 mmol/L (ref 101–111)
Creatinine, Ser: 0.7 mg/dL (ref 0.44–1.00)
GFR calc Af Amer: >60 mL/min (ref >60)
GFR calc non Af Amer: >60 mL/min (ref >60)
GLUCOSE: 135 mg/dL — AB (ref 65–99)
Potassium: 3.2 mmol/L — ABNORMAL LOW (ref 3.5–5.1)
Sodium: 139 mmol/L (ref 135–145)

## 2016-08-21 LAB — I-STAT TROPONIN, ED: TROPONIN I, POC: 0.01 ng/mL (ref 0.00–0.08)

## 2016-08-21 MED ORDER — POTASSIUM CHLORIDE CRYS ER 20 MEQ PO TBCR: 20.0000 meq | EXTENDED_RELEASE_TABLET | Freq: Every day | ORAL | 0 refills | Status: DC

## 2016-08-21 MED ORDER — POTASSIUM CHLORIDE CRYS ER 20 MEQ PO TBCR
40.0000 meq | EXTENDED_RELEASE_TABLET | Freq: Once | ORAL | Status: AC
  Administered 2016-08-21: 40 meq via ORAL
  Filled 2016-08-21: qty 2

## 2016-08-21 MED ORDER — SODIUM CHLORIDE 0.9 % IV BOLUS (SEPSIS)
1000.0000 mL | Freq: Once | INTRAVENOUS | Status: AC
  Administered 2016-08-21: 1000 mL via INTRAVENOUS

## 2016-08-21 NOTE — ED Provider Notes (Signed)
[facility]-EMERGENCY DEPT Provider Note   CSN: [id number] Arrival date & time: 08/20/16  1646   By signing my name below, I, Clarisse Gouge, attest that this documentation has been prepared under the direction and in the presence of Jobani Sabado, Mayer Masker, MD. Electronically signed, Clarisse Gouge, ED Scribe. 08/21/16. 12:37 AM.   History   Chief Complaint Chief Complaint  Patient presents with  . Chest Pain   The history is provided by the patient and medical records. A language interpreter was used (Pt speaks vietnamese and requests interpretor.).    Erin Patterson is a 83 y.o. female with h/o HTN, CAD and MI presenting to the Emergency Department concerning new onset Fatigue x 3 days. Decreased appetite also noted. Pt states she cannot walk very far though she is ambulatory. Unable to distinguish if the pt experiences weakness; interpretor states the pt verbalizes "she has no strength". No pain, N/V/D, fever, dysuria, difficulty speaking. PCP noted for F/U. No other complaints at this time. Denies chest pain or shortness of breath.  Past Medical History:  Diagnosis Date  . Hypercholesteremia   . Hypertension   . [facility]     Patient Active Problem List   Diagnosis Date Noted  . Headache 12/10/2014  . Fever 12/10/2014  . Vision loss 12/10/2014  . HLD (hyperlipidemia) 12/10/2014  . MI (myocardial infarction) (HCC) 12/10/2014  . CAD (coronary artery disease) 12/10/2014    History reviewed. No pertinent surgical history.  OB History    No data available       Home Medications    Prior to Admission medications   Medication Sig Start Date End Date Taking? Authorizing Provider  atorvastatin (LIPITOR) 20 MG tablet Take 20 mg by mouth daily.    [provider]  diltiazem (CARTIA XT) 180 MG 24 hr capsule Take 180 mg by mouth daily.    [provider]  lisinopril (PRINIVIL,ZESTRIL) 20 MG tablet Take 20 mg by mouth daily.    [provider]  potassium chloride SA (K-DUR,KLOR-CON) 20 MEQ tablet Take 1 tablet (20 mEq total) by mouth daily. 08/21/16   [place], Mayer Masker, MD    Family History History reviewed. No pertinent family history.  Social History Social History  Substance Use Topics  . Smoking status: Never Smoker  . Smokeless tobacco: Not on file  . Alcohol use Not on file     Allergies   Patient has no known allergies.   Review of Systems Review of Systems  Constitutional: Positive for appetite change and fatigue. Negative for fever.  Respiratory: Negative for shortness of breath.   Cardiovascular: Negative for chest pain.  Gastrointestinal: Negative for diarrhea, nausea and vomiting.  Genitourinary: Negative for dysuria.  Neurological: Negative for speech difficulty and weakness.  All other systems reviewed and are negative.    Physical Exam Updated Vital Signs BP (!) 174/83 (BP Location: Left Arm)   Pulse (!) 58   Temp 98.2 F (36.8 C) (Oral)   Resp 16   SpO2 99%   Physical Exam  Constitutional: She is oriented to person, place, and time. She appears well-developed and well-nourished. No distress.  HENT:  Head: Normocephalic and atraumatic.  Eyes: Pupils are equal, round, and reactive to light.  Cardiovascular: Normal rate, regular rhythm and normal heart sounds.   No murmur heard. Pulmonary/Chest: Effort normal and breath sounds normal. No respiratory distress. She has no wheezes.  Abdominal: Soft. Bowel sounds are normal. There is no tenderness. There is  no guarding.  Musculoskeletal: She exhibits no edema.  Neurological: She is alert and oriented to person, place, and time.  Cranial nerves II through XII intact, 5 out of 5 strength in all 4 extremities, no drift  Skin: Skin is warm and dry.  Psychiatric: She has a normal mood and affect.  Nursing note and vitals reviewed.    ED Treatments / Results  DIAGNOSTIC STUDIES: Oxygen Saturation is 99% on RA, NL by my  interpretation.    COORDINATION OF CARE: 12:31 AM-Discussed next steps with pt. Pt verbalized understanding and is agreeable with the plan. Will order labs and medications.   Labs (all labs ordered are listed, but only abnormal results are displayed) Labs Reviewed  BASIC METABOLIC PANEL - Abnormal; Notable for the following:       Result Value   Potassium 3.2 (*)    Glucose, Bld 135 (*)    Calcium 8.5 (*)    All other components within normal limits  CBC - Abnormal; Notable for the following:    Hemoglobin 11.8 (*)    HCT 34.9 (*)    MCV 76.7 (*)    MCH 25.9 (*)    RDW 16.0 (*)    Platelets 139 (*)    All other components within normal limits  URINALYSIS, ROUTINE W REFLEX MICROSCOPIC - Abnormal; Notable for the following:    Color, Urine STRAW (*)    Glucose, UA >=500 (*)    Leukocytes, UA TRACE (*)    Squamous Epithelial / LPF 0-5 (*)    All other components within normal limits  I-STAT TROPOININ, ED    EKG  EKG Interpretation  Date/Time:  Tuesday August 20 2016 16:59:57 EDT Ventricular Rate:  64 PR Interval:    QRS Duration: 108 QT Interval:  445 QTC Calculation: 460 R Axis:   35 Text Interpretation:  Sinus rhythm Atrial premature complex RSR' in V1 or V2, right VCD or RVH Confirmed by Ross Marcus ([id number]) on 08/21/2016 12:00:16 AM       Radiology Dg Chest 2 View  Result Date: 08/20/2016 CLINICAL DATA:  Centralized chest pain. EXAM: CHEST  2 VIEW COMPARISON:  Chest radiograph 12/10/2014. FINDINGS: Stable cardiomegaly. Aortic atherosclerosis. No consolidative pulmonary opacities. No pleural effusion or pneumothorax. Thoracic spine degenerative changes. IMPRESSION: No acute cardiopulmonary process. Aortic atherosclerosis. Electronically Signed   By: Annia Belt M.D.   On: 08/20/2016 17:23    Procedures Procedures (including critical care time)  Medications Ordered in ED Medications  potassium chloride SA (K-DUR,KLOR-CON) CR tablet 40 mEq (not administered)    sodium chloride 0.9 % bolus 1,000 mL (1,000 mLs Intravenous New Bag/Given 08/21/16 0113)     Initial Impression / Assessment and Plan / ED Course  I have reviewed the triage vital signs and the nursing notes.  Pertinent labs & imaging results that were available during my care of the patient were reviewed by me and considered in my medical decision making (see chart for details).     Patient presents with generalized weakness and insomnia. She was triaged as chest pain and shortness of breath; however, it is unclear whether an interpreter was used during triage. She specifically denies this to me. She is nontoxic. Vital signs reassuring with the exception of mild hypertension. She is neurologically intact. No signs or symptoms of infection. Lab workup notable for mild hypokalemia. No evidence of significant anemia. No leukocytosis. Urinalysis without evidence of infection. EKG without arrhythmia or evidence of ischemia. Chest x-ray reassuring. Troponin negative.  Patient is able to ambulate independently in the hallway. Etiology for her generalized fatigue is unclear. There is not appear to be an acute emergent process. Follow-up closely with primary care  recommended.   After history, exam, and medical workup I feel the patient has been appropriately medically screened and is safe for discharge home. Pertinent diagnoses were discussed with the patient. Patient was given return precautions.  Final Clinical Impressions(s) / ED Diagnoses   Final diagnoses:  Fatigue, unspecified type  Hypokalemia    New Prescriptions New Prescriptions   POTASSIUM CHLORIDE SA (K-DUR,KLOR-CON) 20 MEQ TABLET    Take 1 tablet (20 mEq total) by mouth daily.   I personally performed the services described in this documentation, which was scribed in my presence. The recorded information has been reviewed and is accurate.    Shon Baton, MD 08/21/16 [phone]

## 2016-08-21 NOTE — ED Notes (Signed)
Pt able to ambulate back to treatment room and to the restroom with a steady gait, NAD.

## 2016-08-21 NOTE — Discharge Instructions (Signed)
You were seen today for generalized fatigue. Your workup is reassuring. Your lab work is all reassuring with the exception of mild low potassium. You will be started on potassium supplementation for the next 5 days. Follow-up closely with your primary physician.

## 2017-07-26 ENCOUNTER — Inpatient Hospital Stay (HOSPITAL_COMMUNITY)
Admission: EM | Admit: 2017-07-26 | Discharge: 2017-07-30 | DRG: 065 | Disposition: A | Discharge status: Rehabilitation Facility | Payer: Medicaid Other | Attending: Family Medicine | Admitting: Family Medicine

## 2017-07-26 ENCOUNTER — Encounter (HOSPITAL_COMMUNITY): Payer: Self-pay

## 2017-07-26 ENCOUNTER — Emergency Department (HOSPITAL_COMMUNITY): Payer: Medicaid Other

## 2017-07-26 ENCOUNTER — Other Ambulatory Visit: Payer: Self-pay

## 2017-07-26 DIAGNOSIS — R7989 Other specified abnormal findings of blood chemistry: Secondary | ICD-10-CM | POA: Diagnosis not present

## 2017-07-26 DIAGNOSIS — G479 Sleep disorder, unspecified: Secondary | ICD-10-CM

## 2017-07-26 DIAGNOSIS — R531 Weakness: Secondary | ICD-10-CM

## 2017-07-26 DIAGNOSIS — R42 Dizziness and giddiness: Secondary | ICD-10-CM

## 2017-07-26 DIAGNOSIS — E785 Hyperlipidemia, unspecified: Secondary | ICD-10-CM | POA: Diagnosis present

## 2017-07-26 DIAGNOSIS — R29704 NIHSS score 4: Secondary | ICD-10-CM | POA: Diagnosis present

## 2017-07-26 DIAGNOSIS — I080 Rheumatic disorders of both mitral and aortic valves: Secondary | ICD-10-CM | POA: Diagnosis present

## 2017-07-26 DIAGNOSIS — I119 Hypertensive heart disease without heart failure: Secondary | ICD-10-CM | POA: Diagnosis present

## 2017-07-26 DIAGNOSIS — I160 Hypertensive urgency: Secondary | ICD-10-CM

## 2017-07-26 DIAGNOSIS — I10 Essential (primary) hypertension: Secondary | ICD-10-CM

## 2017-07-26 DIAGNOSIS — D696 Thrombocytopenia, unspecified: Secondary | ICD-10-CM

## 2017-07-26 DIAGNOSIS — I679 Cerebrovascular disease, unspecified: Secondary | ICD-10-CM

## 2017-07-26 DIAGNOSIS — I6340 Cerebral infarction due to embolism of unspecified cerebral artery: Principal | ICD-10-CM | POA: Diagnosis present

## 2017-07-26 DIAGNOSIS — R252 Cramp and spasm: Secondary | ICD-10-CM | POA: Diagnosis not present

## 2017-07-26 DIAGNOSIS — I639 Cerebral infarction, unspecified: Secondary | ICD-10-CM

## 2017-07-26 DIAGNOSIS — I517 Cardiomegaly: Secondary | ICD-10-CM | POA: Diagnosis not present

## 2017-07-26 DIAGNOSIS — E7800 Pure hypercholesterolemia, unspecified: Secondary | ICD-10-CM | POA: Diagnosis present

## 2017-07-26 DIAGNOSIS — N179 Acute kidney failure, unspecified: Secondary | ICD-10-CM | POA: Diagnosis present

## 2017-07-26 DIAGNOSIS — G8191 Hemiplegia, unspecified affecting right dominant side: Secondary | ICD-10-CM | POA: Diagnosis present

## 2017-07-26 DIAGNOSIS — D649 Anemia, unspecified: Secondary | ICD-10-CM | POA: Diagnosis present

## 2017-07-26 DIAGNOSIS — E876 Hypokalemia: Secondary | ICD-10-CM | POA: Diagnosis present

## 2017-07-26 DIAGNOSIS — I252 Old myocardial infarction: Secondary | ICD-10-CM

## 2017-07-26 DIAGNOSIS — Z8673 Personal history of transient ischemic attack (TIA), and cerebral infarction without residual deficits: Secondary | ICD-10-CM

## 2017-07-26 DIAGNOSIS — I2510 Atherosclerotic heart disease of native coronary artery without angina pectoris: Secondary | ICD-10-CM | POA: Diagnosis present

## 2017-07-26 DIAGNOSIS — R270 Ataxia, unspecified: Secondary | ICD-10-CM | POA: Diagnosis present

## 2017-07-26 HISTORY — DX: Cerebral infarction, unspecified: I63.9

## 2017-07-26 LAB — TROPONIN I
TROPONIN I: 0.03 ng/mL — AB (ref <0.03)
TROPONIN I: 0.03 ng/mL — AB (ref <0.03)

## 2017-07-26 LAB — URINALYSIS, ROUTINE W REFLEX MICROSCOPIC
BILIRUBIN URINE: NEGATIVE
GLUCOSE, UA: 50 mg/dL — AB
KETONES UR: NEGATIVE mg/dL
NITRITE: NEGATIVE
PROTEIN: 30 mg/dL — AB
Specific Gravity, Urine: 1.004 — ABNORMAL LOW (ref 1.005–1.030)
pH: 8 (ref 5.0–8.0)

## 2017-07-26 LAB — LIPID PANEL
CHOL/HDL RATIO: 3 ratio
CHOLESTEROL: 204 mg/dL — AB (ref 0–200)
HDL: 68 mg/dL (ref >40)
LDL Cholesterol: 127 mg/dL — ABNORMAL HIGH (ref 0–99)
Triglycerides: 43 mg/dL (ref <150)
VLDL: 9 mg/dL (ref 0–40)

## 2017-07-26 LAB — CBC
HCT: 41.8 % (ref 36.0–46.0)
HCT: 42.1 % (ref 36.0–46.0)
HEMOGLOBIN: 13.4 g/dL (ref 12.0–15.0)
Hemoglobin: 13.5 g/dL (ref 12.0–15.0)
MCH: 25.3 pg — AB (ref 26.0–34.0)
MCH: 25.7 pg — AB (ref 26.0–34.0)
MCHC: 32.1 g/dL (ref 30.0–36.0)
MCHC: 32.1 g/dL (ref 30.0–36.0)
MCV: 79 fL (ref 78.0–100.0)
MCV: 80.2 fL (ref 78.0–100.0)
PLATELETS: 135 10*3/uL — AB (ref 150–400)
Platelets: 133 10*3/uL — ABNORMAL LOW (ref 150–400)
RBC: 5.29 MIL/uL — ABNORMAL HIGH (ref 3.87–5.11)
RBC: 5.25 MIL/uL — ABNORMAL HIGH (ref 3.87–5.11)
RDW: 16 % — ABNORMAL HIGH (ref 11.5–15.5)
RDW: 16.2 % — AB (ref 11.5–15.5)
WBC: 4.6 10*3/uL (ref 4.0–10.5)
WBC: 4.8 10*3/uL (ref 4.0–10.5)

## 2017-07-26 LAB — BASIC METABOLIC PANEL
ANION GAP: 12 (ref 5–15)
BUN: 9 mg/dL (ref 6–20)
CALCIUM: 8.9 mg/dL (ref 8.9–10.3)
CO2: 23 mmol/L (ref 22–32)
CREATININE: 0.76 mg/dL (ref 0.44–1.00)
Chloride: 105 mmol/L (ref 101–111)
GFR calc non Af Amer: >60 mL/min (ref >60)
Glucose, Bld: 106 mg/dL — ABNORMAL HIGH (ref 65–99)
Potassium: 3.2 mmol/L — ABNORMAL LOW (ref 3.5–5.1)
SODIUM: 140 mmol/L (ref 135–145)

## 2017-07-26 LAB — CREATININE, SERUM
CREATININE: 0.71 mg/dL (ref 0.44–1.00)
GFR calc Af Amer: >60 mL/min (ref >60)

## 2017-07-26 LAB — PHOSPHORUS: PHOSPHORUS: 2.4 mg/dL — AB (ref 2.5–4.6)

## 2017-07-26 LAB — I-STAT TROPONIN, ED
Troponin i, poc: 0.03 ng/mL (ref 0.00–0.08)
Troponin i, poc: 0.04 ng/mL (ref 0.00–0.08)

## 2017-07-26 LAB — SEDIMENTATION RATE: Sed Rate: 16 mm/hr (ref 0–22)

## 2017-07-26 LAB — CBG MONITORING, ED: GLUCOSE-CAPILLARY: 93 mg/dL (ref 65–99)

## 2017-07-26 LAB — TSH: TSH: 0.762 u[IU]/mL (ref 0.350–4.500)

## 2017-07-26 LAB — C-REACTIVE PROTEIN: CRP: <0.8 mg/dL (ref <1.0)

## 2017-07-26 LAB — BRAIN NATRIURETIC PEPTIDE: B NATRIURETIC PEPTIDE 5: 385.4 pg/mL — AB (ref 0.0–100.0)

## 2017-07-26 LAB — MAGNESIUM: Magnesium: 1.7 mg/dL (ref 1.7–2.4)

## 2017-07-26 MED ORDER — POTASSIUM CHLORIDE CRYS ER 20 MEQ PO TBCR
40.0000 meq | EXTENDED_RELEASE_TABLET | Freq: Once | ORAL | Status: AC
  Administered 2017-07-26: 40 meq via ORAL
  Filled 2017-07-26: qty 2

## 2017-07-26 MED ORDER — DILTIAZEM HCL ER COATED BEADS 180 MG PO CP24
180.0000 mg | ORAL_CAPSULE | Freq: Every day | ORAL | Status: DC
  Administered 2017-07-26 – 2017-07-27 (×2): 180 mg via ORAL
  Filled 2017-07-26 (×2): qty 1

## 2017-07-26 MED ORDER — ENOXAPARIN SODIUM 40 MG/0.4ML ~~LOC~~ SOLN
40.0000 mg | SUBCUTANEOUS | Status: DC
  Administered 2017-07-26: 40 mg via SUBCUTANEOUS
  Filled 2017-07-26: qty 0.4

## 2017-07-26 MED ORDER — LISINOPRIL 20 MG PO TABS
20.0000 mg | ORAL_TABLET | Freq: Every day | ORAL | Status: DC
  Administered 2017-07-26 – 2017-07-27 (×2): 20 mg via ORAL
  Filled 2017-07-26 (×2): qty 1

## 2017-07-26 MED ORDER — ATORVASTATIN CALCIUM 20 MG PO TABS
20.0000 mg | ORAL_TABLET | Freq: Every day | ORAL | Status: DC
  Administered 2017-07-26: 20 mg via ORAL
  Filled 2017-07-26: qty 1

## 2017-07-26 MED ORDER — DILTIAZEM HCL ER COATED BEADS 180 MG PO CP24: 180.0000 mg | ORAL_CAPSULE | Freq: Every day | ORAL | Status: DC

## 2017-07-26 MED ORDER — HYDRALAZINE HCL 20 MG/ML IJ SOLN: 10.0000 mg | Freq: Four times a day (QID) | INTRAMUSCULAR | Status: DC | PRN

## 2017-07-26 MED ORDER — ACETAMINOPHEN 325 MG PO TABS
650.0000 mg | ORAL_TABLET | Freq: Four times a day (QID) | ORAL | Status: DC | PRN
  Administered 2017-07-26 – 2017-07-28 (×2): 650 mg via ORAL
  Filled 2017-07-26 (×2): qty 2

## 2017-07-26 MED ORDER — LISINOPRIL-HYDROCHLOROTHIAZIDE 20-12.5 MG PO TABS: 1.0000 | ORAL_TABLET | Freq: Two times a day (BID) | ORAL | Status: DC

## 2017-07-26 MED ORDER — HYDRALAZINE HCL 20 MG/ML IJ SOLN
10.0000 mg | INTRAMUSCULAR | Status: DC | PRN
  Administered 2017-07-26: 10 mg via INTRAVENOUS
  Filled 2017-07-26: qty 1

## 2017-07-26 MED ORDER — HYDROCHLOROTHIAZIDE 12.5 MG PO CAPS
12.5000 mg | ORAL_CAPSULE | Freq: Every day | ORAL | Status: DC
  Administered 2017-07-26 – 2017-07-27 (×2): 12.5 mg via ORAL
  Filled 2017-07-26 (×2): qty 1

## 2017-07-26 MED ORDER — ACETAMINOPHEN 650 MG RE SUPP
650.0000 mg | Freq: Four times a day (QID) | RECTAL | Status: DC | PRN
Start: 2017-07-26 — End: 2017-07-30

## 2017-07-26 MED ORDER — FUROSEMIDE 10 MG/ML IJ SOLN
20.0000 mg | Freq: Once | INTRAMUSCULAR | Status: AC
  Administered 2017-07-26: 20 mg via INTRAVENOUS
  Filled 2017-07-26: qty 2

## 2017-07-26 NOTE — ED Notes (Signed)
Pt placed on bedpan

## 2017-07-26 NOTE — ED Triage Notes (Signed)
Pt arrives from home with c/o dizziness and genearalized "tiredness\" since yesterday. Pt does not speak english. Information through interpretor.

## 2017-07-26 NOTE — ED Notes (Signed)
CBG 93 

## 2017-07-26 NOTE — H&P (Addendum)
[facility] Admission History and Physical Service Pager: [phone]  Patient name: Erin Patterson Medical record number: [id number] Date of birth: [birth year] Age: 84 y.o. Gender: female  Primary Care Provider: [name], MD Consultants: None  Code Status: Full ([place] interpreter)  Chief Complaint: Weakness and dizziness  Assessment and Plan: Erin Patterson is a 84 y.o. female with a past medical history significant for hypertension, hyperlipidemia, CAD and previous MI presenting with weakness and dizziness.   #Weakness/dizziness, acute Patient presented with weakness and dizziness which started this morning (6/1).  Patient reports 2 episodes of near fall but denies any loss of consciousness.  On admission, BP was elevated to 190/94 with remaining vital signs within normal limits.  BMP was remarkable for K+3.2 with remaining values were within normal limits.  Hemoglobin was 13.4 with platelets of 134.  Troponin was negative at 0.03.  Chest x-ray showed stable cardiomegaly with no signs of infection or other pulmonary process.  Blood glucose on admission was 106.  On exam patient had mild crackles bilaterally with trace pitting edema.  Neuro exam did not show any focal neurological deficits.  EKG consistent with LVH with normal sinus rhythm.  Patient denies any chest pain, shortness of breath, headaches, visual changes, room spinning.  She does endorse bilateral lower extremity cramping.  Based on lab results, physical exam findings and current clinical status  It is unclear what is causing patient's weakness/dizziness.  Without any neurologic deficit head imaging is not warranted at this point.  Dizziness could be secondary to orthostatic hypotension in the setting of new medication though elevated blood pressure on admission does not quite fit diagnosis.  Symptoms could also be secondary to possible cardiac arrhythmia though EKG and cardiac monitoring have  shown normal sinus rhythm.  Weakness in her leg could be secondary to cramping in the setting of possible electrolyte abnormalities.  Patient has an extensive history of CAD, however troponin have been negative and there is no chest pain or other symptoms concerning for cardiac process explaining patient's weakness/dizziness.  Patient does have cardiomegaly as well as LVH and crackles on exam which are concerning for possible CHF.  No echo in patient's record. Hbg is 13.5 making symptoms less likely to be secondary to anemia. --Admit to FMTS, admitting attending Dr. Gwendlyn Deutscher --Admit to Telemetry --Order orthostatic BP --am EKG -- am CBC, BMP --Tropo (x3), TSH, a1c --ESR and CRP --Acetaminophen 650 mg q6 prn --Vitals per floor  #Elevated blood pressure/HTN, uncontrolled Blood pressure on admission was 190/94.  While in ED max BP 204/109. Patient is on lisinopril/HCTZ as well as diltiazem at home.  Patient reports that she was seen yesterday by PCP who refilled her blood pressure medication.  Patient reports adherence, however based on pharmacy records, it appears that patient has not filled her medication in the past few months.  Patient denies any headache, abdominal pain, chest pain, shortness of breath, but does endorse dizziness.  Weakness and dizziness could be secondary to uncontrolled hypertension consistent with hypertensive urgency. --Prescribed hydralazine 10 mg IV every 6 as needed --Resume lisinopril-HCTZ 20-12.5 mg twice daily starting on 6/2 --Resume diltiazem 180 mg daily starting on 6/2  #Hypokalemia On admission K+ 3.2.  Patient currently on lisinopril-HCTZ likely causing potassium wasting.  Was previously on potassium supplementation 20 mEq daily.  Received 40 mEq in the ED.  We will continue to monitor. --Follow-up on a.m. BMP --Check magnesium and phosphorus level --Replete as needed  #CAD Patient  is a history of MI.  Currently on atorvastatin and blood pressure meds.  Lipid  panel in 2016 showed cholesterol 172, HDL 43, LDL 122, triglycerides 26.  Patient is not on ASA.  Chest x-ray showed cardiomegaly however patient does not have any formal diagnosis of CHF.  Crackles were noted on lung exam.  EKG also showed LVH given patient history of hypertension could warrant.  Further cardiac work-up --Repeat lipid panel --Follow-up on BNP  --Consider echocardiogram  #Hyperlipidemia Continue home medication --Continue atorvastatin 20 mg daily --Follow-up on lipid panel   FEN/GI: Heart healthy diet Prophylaxis: Lovenox  Disposition: Place on observation, home pending clinical improvement   History of Present Illness:  Erin Patterson is a 84 y.o. female past medical history significant for hypertension, hyperlipidemia, CAD, with previous MI presenting with weakness and dizziness.  Patient reports that symptoms started this morning when she felt weak with ambulation and also reports episodes of dizziness.  Patient became concerned when she almost fell two times due to weakness.  Patient denies any similar presentation in the past.  Patient denies any loss of consciousness or fall. Patient reports that she was seen yesterday by her PCP and was prescribed cholesterol and blood pressure medications.  Prior to this morning patient was at her baseline.  She denies any chest pain, shortness of breath, headaches, abdominal pain, nausea, vomiting, vision changes, facial droop and focal deficits.  Review Of Systems: Per HPI with the following additions:  Review of Systems  Constitutional: Negative.   HENT: Negative.   Eyes: Negative.   Respiratory: Negative.   Cardiovascular: Negative.   Gastrointestinal: Negative.   Genitourinary: Negative.   Musculoskeletal: Negative.   Skin: Negative.   Neurological: Positive for dizziness and weakness.  Endo/Heme/Allergies: Negative.   Psychiatric/Behavioral: Negative.     Patient Active Problem List   Diagnosis Date Noted  .  Dizziness 07/26/2017  . Headache 12/10/2014  . Fever 12/10/2014  . Vision loss 12/10/2014  . HLD (hyperlipidemia) 12/10/2014  . MI (myocardial infarction) ([place]) 12/10/2014  . CAD (coronary artery disease) 12/10/2014    Past Medical History: Past Medical History:  Diagnosis Date  . Hypercholesteremia   . Hypertension   . MI (myocardial infarction) [facility])     Past Surgical History: No past surgical history on file.  Social History: Social History   Tobacco Use  . Smoking status: Never Smoker  . Smokeless tobacco: Never Used  Substance Use Topics  . Alcohol use: Never    Frequency: Never  . Drug use: Never   Additional social history:   Please also refer to relevant sections of EMR.  Family History: No family history on file. (If not completed, MUST add something in)  Allergies and Medications: No Known Allergies No current facility-administered medications on file prior to encounter.    Current Outpatient Medications on File Prior to Encounter  Medication Sig Dispense Refill  . atorvastatin (LIPITOR) 20 MG tablet Take 20 mg by mouth daily.    Marland Kitchen diltiazem (CARTIA XT) 180 MG 24 hr capsule Take 180 mg by mouth daily.    Marland Kitchen lisinopril-hydrochlorothiazide (PRINZIDE,ZESTORETIC) 20-12.5 MG tablet Take 1 tablet by mouth 2 (two) times daily.    . meloxicam (MOBIC) 7.5 MG tablet Take 7.5 mg by mouth daily.  2  . potassium chloride SA (K-DUR,KLOR-CON) 20 MEQ tablet Take 1 tablet (20 mEq total) by mouth daily. (Patient not taking: Reported on 07/26/2017) 5 tablet 0    Objective: BP (!) 187/113  Pulse 80   Temp 98.3 F (36.8 C)   Resp 16   Ht 5' (1.524 m)   Wt 110 lb (49.9 kg)   SpO2 97%   BMI 21.48 kg/m    Physical Exam: General: Elderly woman, in no acute distress, pleasant, able to participate in exam Cardiac: RRR, normal heart sounds, no murmurs. 2+ radial and PT pulses bilaterally Respiratory: Bilateral lower lung field crackles noted on exam, no  wheezing. Abdomen: soft, nontender, nondistended, no hepatic or splenomegaly, +BS Extremities: Trace edema or cyanosis. WWP. Skin: warm and dry, 2 small lesions noted on the medial aspect of both knees.  Erythematous without swelling, or drainage. Neuro: alert and oriented x4, cranial nerve II through XII intact.  Sensation and strength intact upper and lower extremities bilaterally.  No focal deficit. Psych: Normal affect and mood  Labs and Imaging: CBC BMET  Recent Labs  Lab 07/26/17 1028  WBC 4.6  HGB 13.4  HCT 41.8  PLT 135*   Recent Labs  Lab 07/26/17 1028  NA 140  K 3.2*  CL 105  CO2 23  BUN 9  CREATININE 0.76  GLUCOSE 106*  CALCIUM 8.9     iTrop:0.03  Dg Chest Port 1 View  Result Date: 07/26/2017 CLINICAL DATA:  Weakness; c/o dizziness and genearalized "tiredness/" since yesterday EXAM: PORTABLE CHEST 1 VIEW COMPARISON:  Chest x-ray dated 08/20/2016. FINDINGS: Stable cardiomegaly. Aortic atherosclerosis. Lungs are clear. No pleural effusion or pneumothorax seen. No acute or suspicious osseous finding. IMPRESSION: 1. No active disease.  No evidence of pneumonia or pulmonary edema. 2. Cardiomegaly. Electronically Signed   By: Franki Cabot M.D.   On: 07/26/2017 11:25    Marjie Skiff, MD 07/26/2017, 3:17 PM PGY-2, [place] Intern pager: [phone], text pages welcome

## 2017-07-26 NOTE — ED Notes (Signed)
FP MD at bedsdie. Usnig intepretor pad.

## 2017-07-26 NOTE — ED Provider Notes (Signed)
[facility] EMERGENCY DEPARTMENT Provider Note   CSN: [id number] Arrival date & time: 07/26/17  4098   History is obtained from professional medical interpreter. Patient speaks no [place]  History   Chief Complaint Chief Complaint  Patient presents with  . Dizziness    HPI Erin Patterson is a 84 y.o. female.she complains of fatigue and generalized weakness exacerbated by walking for the past 2-3 days. She reports being seen by her primary care physician yesterday for same complaint she reports being given an injection of some sort however she feels unchanged today. She denies any chest pain she denies abdominal pain denies headache. Other associated symptoms include urinary frequency. Her last bowel movement was 2 days ago, normal. Denies pain anywhere. Denies fever. Symptoms worse with walking improved with rest  HPI  Past Medical History:  Diagnosis Date  . Hypercholesteremia   . Hypertension   . MI (myocardial infarction) [facility])     Patient Active Problem List   Diagnosis Date Noted  . Headache 12/10/2014  . Fever 12/10/2014  . Vision loss 12/10/2014  . HLD (hyperlipidemia) 12/10/2014  . MI (myocardial infarction) ([facility]) 12/10/2014  . CAD (coronary artery disease) 12/10/2014    No past surgical history on file.   OB History   None      Home Medications    Prior to Admission medications   Medication Sig Start Date End Date Taking? Authorizing Provider  atorvastatin (LIPITOR) 20 MG tablet Take 20 mg by mouth daily.    [provider]  diltiazem (CARTIA XT) 180 MG 24 hr capsule Take 180 mg by mouth daily.    [provider]  lisinopril-hydrochlorothiazide (PRINZIDE,ZESTORETIC) 20-12.5 MG tablet Take 1 tablet by mouth 2 (two) times daily.    [provider]  meloxicam (MOBIC) 7.5 MG tablet Take 7.5 mg by mouth daily. 05/27/17   [provider]  potassium chloride SA (K-DUR,KLOR-CON) 20 MEQ tablet Take 1 tablet (20  mEq total) by mouth daily. Patient not taking: Reported on 07/26/2017 08/21/16   Horton, Mayer Masker, MD    Family History No family history on file.  Social History Social History   Tobacco Use  . Smoking status: Never Smoker  . Smokeless tobacco: Never Used  Substance Use Topics  . Alcohol use: Never    Frequency: Never  . Drug use: Never     Allergies   Patient has no known allergies.   Review of Systems Review of Systems  Constitutional: Positive for fatigue.  HENT: Negative.   Respiratory: Negative.   Cardiovascular: Negative.   Gastrointestinal: Negative.   Genitourinary: Positive for frequency.  Musculoskeletal: Negative.   Skin: Negative.   Neurological: Positive for weakness.  Psychiatric/Behavioral: Negative.   All other systems reviewed and are negative.    Physical Exam Updated Vital Signs BP (!) 166/145   Pulse 91   Temp 98.3 F (36.8 C) (Oral)   Resp (!) 27   Ht 5' (1.524 m)   Wt 49.9 kg (110 lb)   SpO2 94%   BMI 21.48 kg/m   Physical Exam  Constitutional: She appears well-developed and well-nourished. No distress.  HENT:  Head: Normocephalic and atraumatic.  Eyes: Pupils are equal, round, and reactive to light. Conjunctivae are normal.  Neck: Neck supple. No tracheal deviation present. No thyromegaly present.  Cardiovascular: Normal rate and regular rhythm.  No murmur heard. Pulmonary/Chest: Effort normal and breath sounds normal.  Abdominal: Soft. Bowel sounds are normal. She exhibits no distension.  There is no tenderness.  Musculoskeletal: Normal range of motion. She exhibits no edema or tenderness.  Neurological: She is alert. A cranial nerve deficit is present. Coordination normal.  Gait normal she feels "tired" when she gets up to walk  Skin: Skin is warm and dry. No rash noted.  Psychiatric: She has a normal mood and affect.  Nursing note and vitals reviewed.    ED Treatments / Results  Labs (all labs ordered are listed, but  only abnormal results are displayed) Labs Reviewed  CBC - Abnormal; Notable for the following components:      Result Value   RBC 5.29 (*)    MCH 25.3 (*)    RDW 16.0 (*)    Platelets 135 (*)    All other components within normal limits  BASIC METABOLIC PANEL  URINALYSIS, ROUTINE W REFLEX MICROSCOPIC  CBG MONITORING, ED  I-STAT TROPONIN, ED    EKG EKG Interpretation  Date/Time:  Saturday July 26 2017 09:40:30 EDT Ventricular Rate:  80 PR Interval:    QRS Duration: 104 QT Interval:  429 QTC Calculation: 495 R Axis:   7 Text Interpretation:  Sinus rhythm Probable left atrial enlargement Left ventricular hypertrophy Borderline prolonged QT interval No significant change since last tracing Confirmed by Doug Sou [phone]) on 07/26/2017 9:52:52 AM   Radiology No results found.  Procedures Procedures (including critical care time)  Medications Ordered in ED Medications - No data to display Results for orders placed or performed during the hospital encounter of 07/26/17  Basic metabolic panel  Result Value Ref Range   Sodium 140 135 - 145 mmol/L   Potassium 3.2 (L) 3.5 - 5.1 mmol/L   Chloride 105 101 - 111 mmol/L   CO2 23 22 - 32 mmol/L   Glucose, Bld 106 (H) 65 - 99 mg/dL   BUN 9 6 - 20 mg/dL   Creatinine, Ser 9.14 0.44 - 1.00 mg/dL   Calcium 8.9 8.9 - 78.2 mg/dL   GFR calc non Af Amer >60 >60 mL/min   GFR calc Af Amer >60 >60 mL/min   Anion gap 12 5 - 15  CBC  Result Value Ref Range   WBC 4.6 4.0 - 10.5 K/uL   RBC 5.29 (H) 3.87 - 5.11 MIL/uL   Hemoglobin 13.4 12.0 - 15.0 g/dL   HCT 95.6 21.3 - 08.6 %   MCV 79.0 78.0 - 100.0 fL   MCH 25.3 (L) 26.0 - 34.0 pg   MCHC 32.1 30.0 - 36.0 g/dL   RDW 57.8 (H) 46.9 - 62.9 %   Platelets 135 (L) 150 - 400 K/uL  Urinalysis, Routine w reflex microscopic  Result Value Ref Range   Color, Urine COLORLESS (A) YELLOW   APPearance CLEAR CLEAR   Specific Gravity, Urine 1.004 (L) 1.005 - 1.030   pH 8.0 5.0 - 8.0   Glucose, UA  50 (A) NEGATIVE mg/dL   Hgb urine dipstick SMALL (A) NEGATIVE   Bilirubin Urine NEGATIVE NEGATIVE   Ketones, ur NEGATIVE NEGATIVE mg/dL   Protein, ur 30 (A) NEGATIVE mg/dL   Nitrite NEGATIVE NEGATIVE   Leukocytes, UA TRACE (A) NEGATIVE   RBC / HPF 11-20 0 - 5 RBC/hpf   WBC, UA 0-5 0 - 5 WBC/hpf   Bacteria, UA RARE (A) NONE SEEN   Squamous Epithelial / LPF 0-5 0 - 5  CBG monitoring, ED  Result Value Ref Range   Glucose-Capillary 93 65 - 99 mg/dL  I-stat troponin, ED  Result Value Ref  Range   Troponin i, poc 0.03 0.00 - 0.08 ng/mL   Comment 3           Dg Chest Port 1 View  Result Date: 07/26/2017 CLINICAL DATA:  Weakness; c/o dizziness and genearalized "tiredness/" since yesterday EXAM: PORTABLE CHEST 1 VIEW COMPARISON:  Chest x-ray dated 08/20/2016. FINDINGS: Stable cardiomegaly. Aortic atherosclerosis. Lungs are clear. No pleural effusion or pneumothorax seen. No acute or suspicious osseous finding. IMPRESSION: 1. No active disease.  No evidence of pneumonia or pulmonary edema. 2. Cardiomegaly. Electronically Signed   By: Bary Richard M.D.   On: 07/26/2017 11:25    Initial Impression / Assessment and Plan / ED Course  I have reviewed the triage vital signs and the nursing notes.  Pertinent labs & imaging results that were available during my care of the patient were reviewed by me and considered in my medical decision making (see chart for details).     Patient and husbanddo not know who her primary care physician isor wear he/she is located old records reviewed. From 2016 she saw a primary care physician [facility]. Oral potassium supplementation ordered Consulted family medicine resident physician who will evaluate patient in ED  Lab work consistent with hypokalemia. There is no evidence of end organ damage however concerned the patient has generalized weakness to the point where she almost fell a few times, along with market hypertension. Concern for hypertensive urgency may  need medication adjustment. Final Clinical Impressions(s) / ED Diagnoses  Diagnosis #1 generalized weakness #2 hypokalemia #3 elevated blood pressure Final diagnoses:  None    ED Discharge Orders    None       Doug Sou, MD 07/26/17 1254

## 2017-07-26 NOTE — Progress Notes (Signed)
Spoke with MD on call regarding pt having leg cramps/spasms. MD stated to put a heating pad around both legs to see if it helps and to encourage po intake.   Larey Days, RN

## 2017-07-26 NOTE — ED Notes (Signed)
Pt takes medication after much discussion through interpretor.

## 2017-07-26 NOTE — Progress Notes (Signed)
Late Entry: This RN spoke with interpreter to let patient know that patient was about to be given Lasix and that this medication would make her have to urinate frequently. This RN saw in the notes that patient has been unsteady and almost fell a couple times prior to admission. This RN told patient, via interpreter, to call staff when needing to get up if family is not there to help her. Patient stated okay. Daughter stated that she would stay with patient tonight as well.   Larey Days, RN

## 2017-07-27 ENCOUNTER — Encounter (HOSPITAL_COMMUNITY): Payer: Self-pay | Admitting: Radiology

## 2017-07-27 ENCOUNTER — Observation Stay (HOSPITAL_COMMUNITY): Payer: Medicaid Other

## 2017-07-27 DIAGNOSIS — I679 Cerebrovascular disease, unspecified: Secondary | ICD-10-CM | POA: Diagnosis not present

## 2017-07-27 DIAGNOSIS — R270 Ataxia, unspecified: Secondary | ICD-10-CM

## 2017-07-27 DIAGNOSIS — I160 Hypertensive urgency: Secondary | ICD-10-CM | POA: Diagnosis present

## 2017-07-27 DIAGNOSIS — R740 Nonspecific elevation of levels of transaminase and lactic acid dehydrogenase [LDH]: Secondary | ICD-10-CM | POA: Diagnosis not present

## 2017-07-27 DIAGNOSIS — I10 Essential (primary) hypertension: Secondary | ICD-10-CM | POA: Diagnosis not present

## 2017-07-27 DIAGNOSIS — D696 Thrombocytopenia, unspecified: Secondary | ICD-10-CM | POA: Diagnosis not present

## 2017-07-27 DIAGNOSIS — N179 Acute kidney failure, unspecified: Secondary | ICD-10-CM | POA: Diagnosis present

## 2017-07-27 DIAGNOSIS — R252 Cramp and spasm: Secondary | ICD-10-CM

## 2017-07-27 DIAGNOSIS — I351 Nonrheumatic aortic (valve) insufficiency: Secondary | ICD-10-CM | POA: Diagnosis not present

## 2017-07-27 DIAGNOSIS — I639 Cerebral infarction, unspecified: Secondary | ICD-10-CM | POA: Diagnosis not present

## 2017-07-27 DIAGNOSIS — E785 Hyperlipidemia, unspecified: Secondary | ICD-10-CM | POA: Diagnosis not present

## 2017-07-27 DIAGNOSIS — I340 Nonrheumatic mitral (valve) insufficiency: Secondary | ICD-10-CM | POA: Diagnosis not present

## 2017-07-27 DIAGNOSIS — D649 Anemia, unspecified: Secondary | ICD-10-CM | POA: Diagnosis present

## 2017-07-27 DIAGNOSIS — E876 Hypokalemia: Secondary | ICD-10-CM | POA: Diagnosis present

## 2017-07-27 DIAGNOSIS — G8191 Hemiplegia, unspecified affecting right dominant side: Secondary | ICD-10-CM | POA: Diagnosis present

## 2017-07-27 DIAGNOSIS — Z8673 Personal history of transient ischemic attack (TIA), and cerebral infarction without residual deficits: Secondary | ICD-10-CM | POA: Diagnosis not present

## 2017-07-27 DIAGNOSIS — R29704 NIHSS score 4: Secondary | ICD-10-CM | POA: Diagnosis present

## 2017-07-27 DIAGNOSIS — I252 Old myocardial infarction: Secondary | ICD-10-CM | POA: Diagnosis not present

## 2017-07-27 DIAGNOSIS — I63432 Cerebral infarction due to embolism of left posterior cerebral artery: Secondary | ICD-10-CM | POA: Diagnosis not present

## 2017-07-27 DIAGNOSIS — I517 Cardiomegaly: Secondary | ICD-10-CM

## 2017-07-27 DIAGNOSIS — I69398 Other sequelae of cerebral infarction: Secondary | ICD-10-CM | POA: Diagnosis not present

## 2017-07-27 DIAGNOSIS — I2510 Atherosclerotic heart disease of native coronary artery without angina pectoris: Secondary | ICD-10-CM | POA: Diagnosis present

## 2017-07-27 DIAGNOSIS — I6349 Cerebral infarction due to embolism of other cerebral artery: Secondary | ICD-10-CM | POA: Diagnosis not present

## 2017-07-27 DIAGNOSIS — G479 Sleep disorder, unspecified: Secondary | ICD-10-CM | POA: Diagnosis not present

## 2017-07-27 DIAGNOSIS — E7800 Pure hypercholesterolemia, unspecified: Secondary | ICD-10-CM | POA: Diagnosis present

## 2017-07-27 DIAGNOSIS — R269 Unspecified abnormalities of gait and mobility: Secondary | ICD-10-CM | POA: Diagnosis not present

## 2017-07-27 DIAGNOSIS — R7989 Other specified abnormal findings of blood chemistry: Secondary | ICD-10-CM

## 2017-07-27 DIAGNOSIS — R42 Dizziness and giddiness: Secondary | ICD-10-CM | POA: Diagnosis not present

## 2017-07-27 DIAGNOSIS — I080 Rheumatic disorders of both mitral and aortic valves: Secondary | ICD-10-CM | POA: Diagnosis present

## 2017-07-27 DIAGNOSIS — I119 Hypertensive heart disease without heart failure: Secondary | ICD-10-CM | POA: Diagnosis present

## 2017-07-27 DIAGNOSIS — I5030 Unspecified diastolic (congestive) heart failure: Secondary | ICD-10-CM | POA: Diagnosis not present

## 2017-07-27 DIAGNOSIS — I6340 Cerebral infarction due to embolism of unspecified cerebral artery: Secondary | ICD-10-CM | POA: Diagnosis present

## 2017-07-27 DIAGNOSIS — R531 Weakness: Secondary | ICD-10-CM | POA: Diagnosis present

## 2017-07-27 LAB — BASIC METABOLIC PANEL
ANION GAP: 12 (ref 5–15)
BUN: 26 mg/dL — ABNORMAL HIGH (ref 6–20)
CO2: 23 mmol/L (ref 22–32)
Calcium: 9.8 mg/dL (ref 8.9–10.3)
Chloride: 101 mmol/L (ref 101–111)
Creatinine, Ser: 1.26 mg/dL — ABNORMAL HIGH (ref 0.44–1.00)
GFR calc Af Amer: 45 mL/min — ABNORMAL LOW (ref >60)
GFR, EST NON AFRICAN AMERICAN: 39 mL/min — AB (ref >60)
Glucose, Bld: 115 mg/dL — ABNORMAL HIGH (ref 65–99)
POTASSIUM: 3.6 mmol/L (ref 3.5–5.1)
Sodium: 136 mmol/L (ref 135–145)

## 2017-07-27 LAB — TROPONIN I
TROPONIN I: 0.04 ng/mL — AB (ref <0.03)
Troponin I: 0.04 ng/mL (ref <0.03)

## 2017-07-27 LAB — CBC
HEMATOCRIT: 41.2 % (ref 36.0–46.0)
HEMOGLOBIN: 13.5 g/dL (ref 12.0–15.0)
MCH: 25.4 pg — ABNORMAL LOW (ref 26.0–34.0)
MCHC: 32.8 g/dL (ref 30.0–36.0)
MCV: 77.4 fL — ABNORMAL LOW (ref 78.0–100.0)
Platelets: 168 10*3/uL (ref 150–400)
RBC: 5.32 MIL/uL — AB (ref 3.87–5.11)
RDW: 15.8 % — ABNORMAL HIGH (ref 11.5–15.5)
WBC: 7.6 10*3/uL (ref 4.0–10.5)

## 2017-07-27 LAB — LIPID PANEL
CHOLESTEROL: 201 mg/dL — AB (ref 0–200)
HDL: 66 mg/dL (ref >40)
LDL Cholesterol: 126 mg/dL — ABNORMAL HIGH (ref 0–99)
TRIGLYCERIDES: 44 mg/dL (ref <150)
Total CHOL/HDL Ratio: 3 RATIO
VLDL: 9 mg/dL (ref 0–40)

## 2017-07-27 LAB — HEMOGLOBIN A1C
HEMOGLOBIN A1C: 5.6 % (ref 4.8–5.6)
MEAN PLASMA GLUCOSE: 114 mg/dL

## 2017-07-27 LAB — VITAMIN B12: Vitamin B-12: 1007 pg/mL — ABNORMAL HIGH (ref 180–914)

## 2017-07-27 MED ORDER — ENOXAPARIN SODIUM 30 MG/0.3ML ~~LOC~~ SOLN
30.0000 mg | SUBCUTANEOUS | Status: DC
  Administered 2017-07-27 – 2017-07-29 (×3): 30 mg via SUBCUTANEOUS
  Filled 2017-07-27 (×3): qty 0.3

## 2017-07-27 MED ORDER — ATORVASTATIN CALCIUM 40 MG PO TABS
40.0000 mg | ORAL_TABLET | Freq: Every day | ORAL | Status: DC
  Administered 2017-07-27 – 2017-07-29 (×3): 40 mg via ORAL
  Filled 2017-07-27 (×3): qty 1

## 2017-07-27 MED ORDER — HYDRALAZINE HCL 20 MG/ML IJ SOLN: 10.0000 mg | Freq: Four times a day (QID) | INTRAMUSCULAR | Status: DC | PRN

## 2017-07-27 MED ORDER — CLOPIDOGREL BISULFATE 75 MG PO TABS
75.0000 mg | ORAL_TABLET | Freq: Every day | ORAL | Status: DC
  Administered 2017-07-27 – 2017-07-30 (×4): 75 mg via ORAL
  Filled 2017-07-27 (×4): qty 1

## 2017-07-27 MED ORDER — IOPAMIDOL (ISOVUE-370) INJECTION 76%
INTRAVENOUS | Status: AC
  Administered 2017-07-27: 50 mL
  Filled 2017-07-27: qty 50

## 2017-07-27 MED ORDER — ASPIRIN EC 81 MG PO TBEC
81.0000 mg | DELAYED_RELEASE_TABLET | Freq: Every day | ORAL | Status: DC
  Administered 2017-07-27 – 2017-07-30 (×4): 81 mg via ORAL
  Filled 2017-07-27 (×4): qty 1

## 2017-07-27 MED ORDER — SODIUM CHLORIDE 0.9 % IV SOLN: INTRAVENOUS | Status: DC

## 2017-07-27 NOTE — Evaluation (Signed)
Physical Therapy Evaluation Patient Details Name: Erin Patterson MRN: [id number] DOB: 01-29-1933 Today's Date: 07/27/2017   History of Present Illness  Erin Patterson is a 84 y.o. female with a past medical history significant for hypertension, hyperlipidemia, CAD and previous MI who presented with weakness and dizziness in the setting of hypertensive urgency. Noting strength and gait assymetries on PT exam, concern for CVA with dizziness and recent hypertension, and notified MD  Clinical Impression   Pt admitted with above diagnosis. Pt currently with functional limitations due to the deficits listed below (see PT Problem List). Presents with decr functional mobility, strength and gait assymetries, concerning for CVA; Reported R sided weakness during exam today, and MMT grossly confirmed; Spoke with Dr. Artist Pais, and she is up to examine Erin Patterson; Per Albin Felling, RN, they are initiating stroke protocol;  Pt will benefit from skilled PT to increase their independence and safety with mobility to allow discharge to the venue listed below.       Follow Up Recommendations CIR (placed CIR screen)    Equipment Recommendations  Rolling walker with 5" wheels    Recommendations for Other Services OT consult(ordered per protocol)     Precautions / Restrictions Precautions Precautions: Fall      Mobility  Bed Mobility Overal bed mobility: Needs Assistance Bed Mobility: Supine to Sit     Supine to sit: Min assist     General bed mobility comments: Min assist to help scoot hips to EOB   Transfers Overall transfer level: Needs assistance Equipment used: 1 person hand held assist;Rolling walker (2 wheeled) Transfers: Sit to/from Stand Sit to Stand: Min assist;Mod assist         General transfer comment: Min assist to steady; unstable in initial stand, requiring mod assist to keep balance; one instance of sitting back to bed without warning  Ambulation/Gait Ambulation/Gait assistance: Mod  assist Ambulation Distance (Feet): 20 Feet Assistive device: Rolling walker (2 wheeled) Gait Pattern/deviations: Decreased step length - right;Decreased stance time - right;Decreased weight shift to left;Staggering right(noted occasional R foot drag) Gait velocity: slowed   General Gait Details: Right lean pervasive throughout walk in room; short step length R leading to loss of balance to Right; Right lean becomes more pronounced with fatigue  Stairs            Wheelchair Mobility    Modified Rankin (Stroke Patients Only)       Balance Overall balance assessment: Needs assistance Sitting-balance support: Feet supported Sitting balance-Leahy Scale: Good     Standing balance support: Bilateral upper extremity supported Standing balance-Leahy Scale: Poor Standing balance comment: rEliant on external support                             Pertinent Vitals/Pain Pain Assessment: No/denies pain    Home Living Family/patient expects to be discharged to:: Private residence Living Arrangements: Spouse/significant other;Children Available Help at Discharge: Family;Available 24 hours/day(Spouse 24 hours, VERY helpful, but limited physical assist, but can provide S; Daughters are local, but pt reports they work) Type of Home: Apartment Home Access: Stairs to enter Entrance Stairs-Rails: Right Entrance Stairs-Number of Steps: 4 Home Layout: One level Home Equipment: None      Prior Function Level of Independence: Independent               Hand Dominance        Extremity/Trunk Assessment   Upper Extremity Assessment Upper Extremity Assessment: Defer  to OT evaluation;RUE deficits/detail;LUE deficits/detail RUE Deficits / Details: Grossly 4+/5 throughout(Difficult MMT due to language barrier) LUE Deficits / Details: Pt with reports of weakness, and gross weakness with L shoulder flexion compared to R; Grossly 3+/5 biceps    Lower Extremity  Assessment Lower Extremity Assessment: Generalized weakness;RLE deficits/detail RLE Deficits / Details: Pt reports R LE weakness; Ankle dorsiflexion 4/5, quad 4/5, hamstring (tested seated) 4/5; noting R short step length during gait(Difficult MMT due to language barrier)       Communication   Communication: No difficulties;Prefers language other than English([place])  Cognition Arousal/Alertness: Awake/alert Behavior During Therapy: WFL for tasks assessed/performed Overall Cognitive Status: Within Functional Limits for tasks assessed                                        General Comments General comments (skin integrity, edema, etc.): [employer] interpreter, BAN #[id number], facilitated communication during session    Exercises     Assessment/Plan    PT Assessment Patient needs continued PT services  PT Problem List Decreased strength;Decreased activity tolerance;Decreased balance;Decreased mobility;Decreased coordination;Decreased knowledge of use of DME;Decreased safety awareness;Decreased knowledge of precautions       PT Treatment Interventions DME instruction;Gait training;Functional mobility training;Therapeutic activities;Stair training;Therapeutic exercise;Balance training;Neuromuscular re-education;Patient/family education    PT Goals (Current goals can be found in the Care Plan section)  Acute Rehab PT Goals Patient Stated Goal: back to normal PT Goal Formulation: With patient Time For Goal Achievement: 08/10/17 Potential to Achieve Goals: Good    Frequency Min 4X/week   Barriers to discharge Other (comment) Husband can provide supervision and setup, but limited physical assist    Co-evaluation               AM-PAC PT "6 Clicks" Daily Activity  Outcome Measure Difficulty turning over in bed (including adjusting bedclothes, sheets and blankets)?: A Little Difficulty moving from lying on back to sitting on the side of the bed? : A  Little Difficulty sitting down on and standing up from a chair with arms (e.g., wheelchair, bedside commode, etc,.)?: Unable Help needed moving to and from a bed to chair (including a wheelchair)?: A Lot Help needed walking in hospital room?: A Lot Help needed climbing 3-5 steps with a railing? : Total 6 Click Score: 12    End of Session Equipment Utilized During Treatment: Gait belt Activity Tolerance: Patient tolerated treatment well Patient left: in chair;with call bell/phone within reach Nurse Communication: Mobility status;Other (comment)(Concern for strength assymetries; paging MD) PT Visit Diagnosis: Unsteadiness on feet (R26.81);Other abnormalities of gait and mobility (R26.89);Difficulty in walking, not elsewhere classified (R26.2);Hemiplegia and hemiparesis Hemiplegia - Right/Left: Right Hemiplegia - dominant/non-dominant: (unsure) Hemiplegia - caused by: Unspecified(MD assessing)    Time: 5409-8119 PT Time Calculation (min) (ACUTE ONLY): 46 min   Charges:   PT Evaluation $PT Eval Moderate Complexity: 1 Mod PT Treatments $Gait Training: 8-22 mins $Therapeutic Activity: 8-22 mins   PT G Codes:        Van Clines, PT  Acute Rehabilitation Services Pager [phone] Office [phone]   Levi Aland 07/27/2017, 3:22 PM

## 2017-07-27 NOTE — Progress Notes (Signed)
Inpatient Rehabilitation  Per PT request, oatient was screened by Fae Pippin for appropriateness for an Inpatient Acute Rehab consult.  At this time note that the presence of a stroke is being determine.  If so, then we are recommending an Inpatient Rehab consult.  Please order if appropriate and you are agreeable.   Charlane Ferretti., CCC/SLP Admission Coordinator  [facility] Inpatient Rehabilitation  Cell [phone]

## 2017-07-27 NOTE — Consult Note (Signed)
Referring Physician: Dr Shawna Orleans   Chief Complaint: none per pt Consulted for: In-house Code Stroke  HPI: Erin Patterson is an 84 y.o. female Erin Patterson is a 84 y.o. female with a past medical history significant for hypertension, hyperlipidemia, CAD and previous MI who presented to ER on 6/1 with weakness and dizziness in the setting of hypertensive urgency. The pt and husband speak only [place], therefore interpreter services were used at bedside. This afternoon while working with PT, a right side weakness was noted and a CODE STROKE was called. The pt was met in CT by the neurology team, where she tells [place] she has had right side weakness for a "long time", but that it does seem worse over the last few days. NIHSS 4 for wrong orientation questions and a slight ataxia noted in right arm/leg. Baseline mRS 1.   Date last known well: 07/25/17 Time last known well: bedtime  tPA Given: no, outside of any time window for acute stroke tx; mild symptoms  Past Medical History Past Medical History:  Diagnosis Date  . Hypercholesteremia   . Hypertension   . MI (myocardial infarction) [facility])     Surgical History No past surgical history on file.  Family History  No family history on file.  Social History:   reports that she has never smoked. She has never used smokeless tobacco. She reports that she does not drink alcohol or use drugs.  Allergies:  No Known Allergies  Home Medications:  Medications Prior to Admission  Medication Sig Dispense Refill  . atorvastatin (LIPITOR) 20 MG tablet Take 20 mg by mouth daily.    Marland Kitchen diltiazem (CARTIA XT) 180 MG 24 hr capsule Take 180 mg by mouth daily.    Marland Kitchen lisinopril-hydrochlorothiazide (PRINZIDE,ZESTORETIC) 20-12.5 MG tablet Take 1 tablet by mouth 2 (two) times daily.    . meloxicam (MOBIC) 7.5 MG tablet Take 7.5 mg by mouth daily.  2  . potassium chloride SA (K-DUR,KLOR-CON) 20 MEQ tablet Take 1 tablet (20 mEq total) by mouth daily. (Patient not  taking: Reported on 07/26/2017) 5 tablet 0    Hospital Medications . atorvastatin  20 mg Oral q1800  . diltiazem  180 mg Oral Daily  . enoxaparin (LOVENOX) injection  30 mg Subcutaneous Q24H    ROS:  History obtained from EMR/pt  General ROS: + fatigue. negative for - chills, fever, night sweats, weight gain or weight loss Psychological ROS: negative for - behavioral disorder, hallucinations, memory difficulties, mood swings or suicidal ideation Ophthalmic ROS: negative for - blurry vision, double vision, eye pain or loss of vision ENT ROS: negative for - epistaxis, nasal discharge, oral lesions, sore throat, tinnitus or vertigo Allergy and Immunology ROS: negative for - hives or itchy/watery eyes Hematological and Lymphatic ROS: negative for - bleeding problems, bruising or swollen lymph nodes Endocrine ROS: negative for - galactorrhea, hair pattern changes, polydipsia/polyuria or temperature intolerance Respiratory ROS: negative for - cough, hemoptysis, shortness of breath or wheezing Cardiovascular ROS: negative for - chest pain, dyspnea on exertion, edema or irregular heartbeat Gastrointestinal ROS: negative for - abdominal pain, diarrhea, hematemesis, nausea/vomiting or stool incontinence Genito-Urinary ROS: negative for - dysuria, hematuria, incontinence or urinary frequency/urgency Musculoskeletal ROS: negative for - joint swelling or muscular weakness Neurological ROS: as noted in HPI Dermatological ROS: negative for rash and skin lesion changes   Physical Examination: Vitals:   07/26/17 1707 07/26/17 1947 07/27/17 0452 07/27/17 0906  BP: (!) 178/101 123/69 125/72 (!) 110/58  Pulse: 91  81 76 73  Resp: _0 Temp: 98.3 F (36.8 C) 98.9 F (37.2 C) 98.4 F (36.9 C) 98.5 F (36.9 C)  TempSrc: Oral Oral Oral Oral  SpO2: 100% 99% 96% 98%  Weight:  112 lb 7 oz (51 kg)    Height: 5' 4" (1.626 m)       General - no acute distress, well noursihed Heart - Regular rate  and rhythm - no murmer appreciated Lungs - Clear to auscultation  Abdomen - Soft - non tender Extremities - Distal pulses intact - no edema Skin - Warm and dry   Neurologic Examination:  Mental Status: Alert, oriented to self, but gets month/age wrong. Thought content appropriate.  Speech fluent without evidence of aphasia. Able to follow 3 step commands without difficulty. Cranial Nerves: II: Discs not visualized; Visual fields grossly normal, pupils equal, round, reactive to light III,IV, VI: ptosis not present, extra-ocular motions intact bilaterally V,VII: smile symmetric, facial light touch sensation normal bilaterally VIII: hearing normal bilaterally IX,X: gag reflex present XI: bilateral shoulder shrug XII: midline tongue extension Motor: RUE - 5/5    LUE - 5/5   RLE - 5/5    LLE - 5/5 Tone and bulk:normal tone throughout; no atrophy noted Sensory: Light touch intact throughout, bilaterally Deep Tendon Reflexes: 2+ and symmetric throughout Plantars: Right: downgoing   Left: downgoing Cerebellar: Right finger-to-nose and heel-to-shin testing is slightly off in comparison to left Gait: deferred at this time.   NIHSS 1a Level of Conscious:0 1b LOC Questions: 2 1c LOC Commands:0  2 Best Gaze:0  3 Visual: 0 4 Facial Palsy: 0 5a Motor Arm - left: 0 5b Motor Arm - Right:0  6a Motor Leg - Left: 0 6b Motor Leg - Right: 0 7 Limb Ataxia: 2 8 Sensory: 0 9 Best Language:0  10 Dysarthria:0 11 Extinct. and Inattention:0 TOTAL: 4   LABORATORY STUDIES:  Basic Metabolic Panel: Recent Labs  Lab 07/26/17 1028 07/26/17 1522 07/27/17 0712  NA 140  --  136  K 3.2*  --  3.6  CL 105  --  101  CO2 23  --  23  GLUCOSE 106*  --  115*  BUN 9  --  26*  CREATININE 0.76 0.71 1.26*  CALCIUM 8.9  --  9.8  MG  --  1.7  --   PHOS  --  2.4*  --     Liver Function Tests: No results for input(s): AST, ALT, ALKPHOS, BILITOT, PROT, ALBUMIN in the last 168 hours. No results for  input(s): LIPASE, AMYLASE in the last 168 hours. No results for input(s): AMMONIA in the last 168 hours.  CBC: Recent Labs  Lab 07/26/17 1028 07/26/17 1522 07/27/17 0712  WBC 4.6 4.8 7.6  HGB 13.4 13.5 13.5  HCT 41.8 42.1 41.2  MCV 79.0 80.2 77.4*  PLT 135* 133* 168    Cardiac Enzymes: Recent Labs  Lab 07/26/17 1522 07/26/17 1827 07/27/17 0104 07/27/17 0712  TROPONINI 0.03* 0.03* 0.04* 0.04*    BNP: Invalid input(s): POCBNP  CBG: Recent Labs  Lab 07/26/17 1110  GLUCAP 93    Microbiology:   Coagulation Studies: No results for input(s): LABPROT, INR in the last 72 hours.  Urinalysis:  Recent Labs  Lab 07/26/17 0950  COLORURINE COLORLESS*  LABSPEC 1.004*  PHURINE 8.0  GLUCOSEU 50*  HGBUR SMALL*  BILIRUBINUR NEGATIVE  KETONESUR NEGATIVE  PROTEINUR 30*  NITRITE NEGATIVE  LEUKOCYTESUR TRACE*    Lipid Panel:  Component Value Date/Time   CHOL 204 (H) 07/26/2017 1713   TRIG 43 07/26/2017 1713   HDL 68 07/26/2017 1713   CHOLHDL 3.0 07/26/2017 1713   VLDL 9 07/26/2017 1713   LDLCALC 127 (H) 07/26/2017 1713    HgbA1C:  Lab Results  Component Value Date   HGBA1C 5.6 07/26/2017    Urine Drug Screen:  No results found for: LABOPIA, COCAINSCRNUR, LABBENZ, AMPHETMU, THCU, LABBARB   Alcohol Level:  No results for input(s): ETH in the last 168 hours.  Miscellaneous labs:  EKG  EKG   IMAGING: Dg Chest Port 1 View  Result Date: 07/26/2017 CLINICAL DATA:  Weakness; c/o dizziness and genearalized "tiredness/" since yesterday EXAM: PORTABLE CHEST 1 VIEW COMPARISON:  Chest x-ray dated 08/20/2016. FINDINGS: Stable cardiomegaly. Aortic atherosclerosis. Lungs are clear. No pleural effusion or pneumothorax seen. No acute or suspicious osseous finding. IMPRESSION: 1. No active disease.  No evidence of pneumonia or pulmonary edema. 2. Cardiomegaly. Electronically Signed   By: Franki Cabot M.D.   On: 07/26/2017 11:25   Ct Head Code Stroke Wo  Contrast  Result Date: 07/27/2017 CLINICAL DATA:  Code stroke.  Right-sided weakness. EXAM: CT HEAD WITHOUT CONTRAST TECHNIQUE: Contiguous axial images were obtained from the base of the skull through the vertex without intravenous contrast. COMPARISON:  Brain MRI 07/12/2015 FINDINGS: Brain: There is no evidence of acute large territory infarct, intracranial hemorrhage, mass, midline shift, or extra-axial fluid collection. There are small chronic infarcts in the cerebellum bilaterally including a new 3 mm infarct on the left. A dilated perivascular space is again noted inferiorly in the right basal ganglia. A subcentimeter low-density focus in the left lentiform nucleus is unchanged from the prior MRI and was without significant surrounding gliosis on that examination suggesting that this may also represent a dilated perivascular space rather than a chronic lacunar infarct. Generalized cerebral atrophy is mild for age. Vascular: Calcified atherosclerosis at the skull base. No hyperdense vessel. Skull: No fracture or destructive osseous lesion. Sinuses/Orbits: Visualized paranasal sinuses and mastoid air cells are clear. Prior right cataract extraction is noted. Other: None. ASPECTS [facility] Stroke Program Early CT Score) - Ganglionic level infarction (caudate, lentiform nuclei, internal capsule, insula, M1-M3 cortex): 7 - Supraganglionic infarction (M4-M6 cortex): 3 Total score (0-10 with 10 being normal): 10 IMPRESSION: 1. No evidence of acute intracranial abnormality. 2. ASPECTS is 10. These results were communicated to Dr. Leonel Ramsay at 3:56 pm on 07/27/2017 by text page via the [facility] messaging system. Electronically Signed   By: Logan Bores M.D.   On: 07/27/2017 15:57      Assessment: 84 y.o. female with worsening right side deficits that she states she has at baseline. Stroke Risk Factors - age, HTN, HLD, CAD w/MI in past, previous stroke(s)  # Right side ataxia, confused to age/date- no weakness  detected, pt denies any sensation change. States she has "weakness" at baseline. ddx new cerebellar infract, unmasking of previous stroke symptoms. MRI to better identify possible small stroke not seen on CT.   -CTA shows intracranial atherosclerosis, Carotid have only mild plaque, no stenosis  -DAPT indicated for intracranial athero.  # Dizziness and "generalized" weakness in setting of HTN urgency- seems to have improved with BP mgt # HTN- given her admission symptoms and that if there is a new stroke, it is now subacute, would not recommend permissive HTN.  # HLD- lipids pending. Increase daily statin to high intensity  Plan:  Plavix '75mg'$  + ASA '81mg'$  now, then daily  for DAPT for 3wks, then monotherapy  HgbA1c, fasting lipid panel ordered for am  MRI brain without contrast  PT consult, OT consult, Speech consult (already cleared for PO)  Echocardiogram  Prophylactic therapy  Will increases Statin to high intensity: Lipitor 64m daily  Risk factor modification  Telemetry monitoring  Frequent neuro checks  Fall Precautions   Attending Neurologist's note to follow

## 2017-07-27 NOTE — Progress Notes (Signed)
RN called for new orders placed by MD for code stroke. PT noted Right side weakness during her session today, Dr. Shawna Orleans was paged to bedside, she also felt pt's Right side was weaker than 0930 this am. Code Stroke was activated pt was met in CT scanner by stroke team. CT Head, CTA, to be followed with a MRI    Erin Patterson is a 84 year old female with history of HTN, hyperlipidemia, CAD, and MI. She presented to ED 6/1 with new onset of weakness dizziness and hypertension urgency. She and her husband only speak [place], interpreter used at bedside. She presented to ED 6/1 for weakness and dizziness. She stated during our assessment her Right side weakness has been going on for a "long time" but has worsened since waking Saturday morning.  Symptoms mild, no TPA NIHSS 4 for limb ataxia to Right arm/leg and wrong answer for month/age

## 2017-07-27 NOTE — Progress Notes (Signed)
FPTS Interim Progress Note Paged by physical therapy that patient having significant right-sided weakness and unsteady gait.  Patient seen and examined using phone [place] interpreter.   She says that she had a stroke a few years ago but she did not have any residual weakness from that.  She feels like the this weakness is new as of today. She states that since this morning she has gotten progressively weaker and more on her right side than her left.  Denies chest pain, slurred speech, facial droop.  Husband at bedside says that her facial expressions and smile are the same as it it has always been.  O: BP (!) 110/58 (BP Location: Right Arm)   Pulse 73   Temp 98.5 F (36.9 C) (Oral)   Resp 20   Ht 5\' 4"  (1.626 m)   Wt 112 lb 7 oz (51 kg)   SpO2 98%   BMI 19.30 kg/m   General: sitting up in chair, in NAD CV: regular, normal s1 and s2, grade II systolic murmur Lungs: CTAB, normal effort on room air Neuro: PERRL, EOMI. RUE and RLE 3-4/5 weakness compared to 5/5 on LUE and LLE. Decreased sensation on R side of body. Face symmetric.  A/P: R sided weakness and decreased sensation, new since this morning. - CT head to r/o stroke - Informed bedside RN   Leland Her, DO 07/27/2017, 3:16 PM PGY-2, [facility] Service pager [phone]

## 2017-07-27 NOTE — Progress Notes (Signed)
OT Cancellation Note  Patient Details Name: Rosezetta Sammut MRN: [id number] DOB: [birth year]   Cancelled Treatment:    Reason Eval/Treat Not Completed: Patient at procedure or test/ unavailable(CT). Noted new onset of right sided weakness, OT will continue to follow for eval and attempt tomorrow.   Evern Bio Raijon Lindfors 07/27/2017, 4:42 PM  Sherryl Manges OTR/L [phone]

## 2017-07-27 NOTE — Progress Notes (Signed)
Family Medicine Teaching Service Daily Progress Note Intern Pager: [phone]  Patient name: Erin Patterson Medical record number: [id number] Date of birth: [birth year] Age: 84 y.o. Gender: female  Primary Care Provider: Jackie Plum, MD Consultants: none Code Status: Full  Needs Vietnamese interpreter  Pt Overview and Major Events to Date:  6/1 admitted for hypertensive urgency  Assessment and Plan: Erin Patterson is a 84 y.o. female with a past medical history significant for hypertension, hyperlipidemia, CAD and previous MI who presented with weakness and dizziness in the setting of hypertensive urgency.  Weakness/dizziness due to hypertensive urgency, improving. Dizziness resolved. BP now overcorrected to 110/58 from peak 190/100, goal lowering is 25-30% within first 24h. Orthostatics neg. Trop flat 0.03>0.03>0.04 and without chest pain. Weakness thought to be due to patient chronic deconditioning as has been able ambulate without dizziness/lightheadedness but is requiring family assistance. Appears euvolemic. - continue diltiazem 180mg  po qd and lisinopril 20mg  qd.  - hold HCTZ 12.5mg  due to soft BP this morning - s/p IV lasix 20mg  x1, now euvolemic. - prn hydralazine IV 10mg  q6orn for  SBP >180 or DBP >100 - monitor BP closely, on telemetry as weel - PT/OT - echo pending  CAD, h/o MI, HLD. Stable. No chest pain and EKG NSR this morning. Did have leg cramping on admission, none currently, that could be attributed to atorvastatin. LDL 127 - continue atorvastatin 20mg  qd since currently asymptomatic - consider ASA as outpatient  Hypokalemia, resolved. K 3.6 - monitor BMP   FEN/GI: heart healthy diet PPx: lovenox  Disposition: pending appropriate BPs and echo  Subjective:  States that feels a little tired and does not have much appetite. No CP, SOB, palpitations, dizziness, lightheadness, leg pain  Objective: Temp:  [98.3 F (36.8 C)-98.9 F (37.2 C)] 98.5 F (36.9  C) (06/02 0906) Pulse Rate:  [68-96] 73 (06/02 0906) Resp:  [13-27] 20 (06/02 0906) BP: (110-212)/(58-193) 110/58 (06/02 0906) SpO2:  [94 %-100 %] 98 % (06/02 0906) Weight:  [110 lb (49.9 kg)-112 lb 7 oz (51 kg)] 112 lb 7 oz (51 kg) (06/01 1947) Physical Exam: General: laying in bed comfortably, thin appearing Cardiovascular: regular, normal S1 and S2, grade II systolic murmur Respiratory: CTAB with normal effort on room air. No crackles. Abdomen: soft, nontender, nondistended, + bowel sounds Extremities: no LE edema  Laboratory: Recent Labs  Lab 07/26/17 1028 07/26/17 1522 07/27/17 0712  WBC 4.6 4.8 7.6  HGB 13.4 13.5 13.5  HCT 41.8 42.1 41.2  PLT 135* 133* 168   Recent Labs  Lab 07/26/17 1028 07/26/17 1522  NA 140  --   K 3.2*  --   CL 105  --   CO2 23  --   BUN 9  --   CREATININE 0.76 0.71  CALCIUM 8.9  --   GLUCOSE 106*  --     Lipid Panel     Component Value Date/Time   CHOL 204 (H) 07/26/2017 1713   TRIG 43 07/26/2017 1713   HDL 68 07/26/2017 1713   CHOLHDL 3.0 07/26/2017 1713   VLDL 9 07/26/2017 1713   LDLCALC 127 (H) 07/26/2017 1713    Imaging/Diagnostic Tests: Dg Chest Port 1 View  Result Date: 07/26/2017 CLINICAL DATA:  Weakness; c/o dizziness and genearalized "tiredness/" since yesterday EXAM: PORTABLE CHEST 1 VIEW COMPARISON:  Chest x-ray dated 08/20/2016. FINDINGS: Stable cardiomegaly. Aortic atherosclerosis. Lungs are clear. No pleural effusion or pneumothorax seen. No acute or suspicious osseous finding. IMPRESSION: 1. No active disease.  No evidence of pneumonia or pulmonary edema. 2. Cardiomegaly. Electronically Signed   By: Bary Richard M.D.   On: 07/26/2017 11:25    Leland Her, DO 07/27/2017, 9:21 AM PGY-2, [facility] FPTS Intern pager: [phone], text pages welcome

## 2017-07-28 ENCOUNTER — Inpatient Hospital Stay (HOSPITAL_COMMUNITY): Payer: Medicaid Other

## 2017-07-28 DIAGNOSIS — N179 Acute kidney failure, unspecified: Secondary | ICD-10-CM

## 2017-07-28 DIAGNOSIS — I679 Cerebrovascular disease, unspecified: Secondary | ICD-10-CM

## 2017-07-28 DIAGNOSIS — I639 Cerebral infarction, unspecified: Secondary | ICD-10-CM

## 2017-07-28 DIAGNOSIS — Z8673 Personal history of transient ischemic attack (TIA), and cerebral infarction without residual deficits: Secondary | ICD-10-CM

## 2017-07-28 DIAGNOSIS — I2510 Atherosclerotic heart disease of native coronary artery without angina pectoris: Secondary | ICD-10-CM

## 2017-07-28 DIAGNOSIS — I10 Essential (primary) hypertension: Secondary | ICD-10-CM

## 2017-07-28 DIAGNOSIS — I5030 Unspecified diastolic (congestive) heart failure: Secondary | ICD-10-CM

## 2017-07-28 DIAGNOSIS — I6340 Cerebral infarction due to embolism of unspecified cerebral artery: Secondary | ICD-10-CM

## 2017-07-28 LAB — BASIC METABOLIC PANEL
ANION GAP: 13 (ref 5–15)
BUN: 44 mg/dL — ABNORMAL HIGH (ref 6–20)
CHLORIDE: 102 mmol/L (ref 101–111)
CO2: 24 mmol/L (ref 22–32)
Calcium: 10 mg/dL (ref 8.9–10.3)
Creatinine, Ser: 1.56 mg/dL — ABNORMAL HIGH (ref 0.44–1.00)
GFR calc non Af Amer: 30 mL/min — ABNORMAL LOW (ref >60)
GFR, EST AFRICAN AMERICAN: 35 mL/min — AB (ref >60)
GLUCOSE: 114 mg/dL — AB (ref 65–99)
Potassium: 3.6 mmol/L (ref 3.5–5.1)
Sodium: 139 mmol/L (ref 135–145)

## 2017-07-28 LAB — HEMOGLOBIN A1C
HEMOGLOBIN A1C: 5.4 % (ref 4.8–5.6)
MEAN PLASMA GLUCOSE: 108.28 mg/dL

## 2017-07-28 LAB — RAPID URINE DRUG SCREEN, HOSP PERFORMED
Amphetamines: NOT DETECTED
Barbiturates: NOT DETECTED
Benzodiazepines: NOT DETECTED
COCAINE: NOT DETECTED
OPIATES: NOT DETECTED
Tetrahydrocannabinol: NOT DETECTED

## 2017-07-28 LAB — CBC
HCT: 40.9 % (ref 36.0–46.0)
HEMOGLOBIN: 13.7 g/dL (ref 12.0–15.0)
MCH: 25.7 pg — ABNORMAL LOW (ref 26.0–34.0)
MCHC: 33.5 g/dL (ref 30.0–36.0)
MCV: 76.7 fL — AB (ref 78.0–100.0)
Platelets: 157 10*3/uL (ref 150–400)
RBC: 5.33 MIL/uL — ABNORMAL HIGH (ref 3.87–5.11)
RDW: 15.6 % — AB (ref 11.5–15.5)
WBC: 6.8 10*3/uL (ref 4.0–10.5)

## 2017-07-28 LAB — ECHOCARDIOGRAM COMPLETE
HEIGHTINCHES: 64 in
Weight: 1770.73 oz

## 2017-07-28 LAB — FOLATE RBC
FOLATE, HEMOLYSATE: 312.9 ng/mL
Folate, RBC: 794 ng/mL (ref >498)
Hematocrit: 39.4 % (ref 34.0–46.6)

## 2017-07-28 MED ORDER — SODIUM CHLORIDE 0.9 % IV BOLUS
500.0000 mL | Freq: Once | INTRAVENOUS | Status: AC
  Administered 2017-07-28: 500 mL via INTRAVENOUS

## 2017-07-28 NOTE — Progress Notes (Signed)
OT Cancellation Note  Patient Details Name: Erin Patterson MRN: [id number] DOB: Oct 25, 1932   Cancelled Treatment:    Reason Eval/Treat Not Completed: Patient at procedure or test/ unavailable. Will attempt again as time allows.  Jackquline Denmark P 07/28/2017, 9:25 AM

## 2017-07-28 NOTE — Progress Notes (Signed)
Physical Therapy Treatment Patient Details Name: Erin Patterson MRN: [id number] DOB: 1932/03/28 Today's Date: 07/28/2017    History of Present Illness Erin Patterson is a 84 y.o. female with a past medical history significant for hypertension, hyperlipidemia, CAD and previous MI who presented with weakness and dizziness in the setting of hypertensive urgency. Noting strength and gait assymetries on PT exam, concern for CVA with dizziness and recent hypertension, and notified MD    PT Comments    Patient required min/mod A for all mobility this session and continues to demonstrate R side weakness. R LE weakness more prevalent when becoming fatigued. Continue to progress as tolerated.    Follow Up Recommendations  CIR     Equipment Recommendations  Rolling walker with 5" wheels    Recommendations for Other Services OT consult     Precautions / Restrictions Precautions Precautions: Fall Restrictions Weight Bearing Restrictions: No    Mobility  Bed Mobility Overal bed mobility: Needs Assistance Bed Mobility: Supine to Sit     Supine to sit: Min assist     General bed mobility comments: Min assist to help scoot hips to EOB   Transfers Overall transfer level: Needs assistance Equipment used: 1 person hand held assist;Rolling walker (2 wheeled) Transfers: Sit to/from Stand Sit to Stand: Min assist         General transfer comment: assist to steady especially when pivoting toward R side   Ambulation/Gait Ambulation/Gait assistance: Mod assist Ambulation Distance (Feet): 80 Feet Assistive device: 1 person hand held assist;Rolling walker (2 wheeled) Gait Pattern/deviations: Step-through pattern;Decreased step length - left;Decreased stride length;Decreased dorsiflexion - right     General Gait Details: initially ambulated with RW in room and HHA rest of way however pt reaching for something to hold on other side; difficulty advancing R LE forward and with R foot dragging  when fatigued    Stairs             Wheelchair Mobility    Modified Rankin (Stroke Patients Only)       Balance Overall balance assessment: Needs assistance Sitting-balance support: Feet supported Sitting balance-Leahy Scale: Good     Standing balance support: Bilateral upper extremity supported Standing balance-Leahy Scale: Poor Standing balance comment: reliant on external support                            Cognition Arousal/Alertness: Awake/alert Behavior During Therapy: WFL for tasks assessed/performed Overall Cognitive Status: Within Functional Limits for tasks assessed                                        Exercises      General Comments        Pertinent Vitals/Pain Pain Assessment: Faces Faces Pain Scale: No hurt    Home Living Family/patient expects to be discharged to:: Private residence Living Arrangements: Spouse/significant other;Children Available Help at Discharge: Family;Available 24 hours/day Type of Home: Apartment Home Access: Stairs to enter Entrance Stairs-Rails: Right Home Layout: One level Home Equipment: None      Prior Function Level of Independence: Independent          PT Goals (current goals can now be found in the care plan section) Acute Rehab PT Goals Patient Stated Goal: none stated Progress towards PT goals: Progressing toward goals    Frequency    Min 4X/week  PT Plan Current plan remains appropriate    Co-evaluation PT/OT/SLP Co-Evaluation/Treatment: Yes Reason for Co-Treatment: For patient/therapist safety;To address functional/ADL transfers PT goals addressed during session: Mobility/safety with mobility OT goals addressed during session: ADL's and self-care      AM-PAC PT "6 Clicks" Daily Activity  Outcome Measure  Difficulty turning over in bed (including adjusting bedclothes, sheets and blankets)?: A Little Difficulty moving from lying on back to sitting on  the side of the bed? : A Little Difficulty sitting down on and standing up from a chair with arms (e.g., wheelchair, bedside commode, etc,.)?: Unable Help needed moving to and from a bed to chair (including a wheelchair)?: A Lot Help needed walking in hospital room?: A Lot Help needed climbing 3-5 steps with a railing? : A Lot 6 Click Score: 13    End of Session Equipment Utilized During Treatment: Gait belt Activity Tolerance: Patient tolerated treatment well Patient left: in bed;with call bell/phone within reach;with bed alarm set;with family/visitor present Nurse Communication: Mobility status PT Visit Diagnosis: Unsteadiness on feet (R26.81);Other abnormalities of gait and mobility (R26.89);Difficulty in walking, not elsewhere classified (R26.2);Hemiplegia and hemiparesis Hemiplegia - Right/Left: Right Hemiplegia - caused by: Unspecified     Time: 5397-6734 PT Time Calculation (min) (ACUTE ONLY): 24 min  Charges:  $Gait Training: 8-22 mins                    G Codes:       Erline Levine, PTA Pager: [phone]     Carolynne Edouard 07/28/2017, 5:13 PM

## 2017-07-28 NOTE — Progress Notes (Signed)
Inpatient Rehabilitation  Patient was re-screened by Fae Pippin for appropriateness for an Inpatient Acute Rehab consult.  At this time note MRI, positive CVA and we are recommending an Inpatient Rehab consult.  Notified medical team; please order if you are agreeable.    Charlane Ferretti., CCC/SLP Admission Coordinator  [facility] Inpatient Rehabilitation  Cell [phone]

## 2017-07-28 NOTE — Progress Notes (Signed)
Family Medicine Teaching Service Daily Progress Note Intern Pager: [phone]  Patient name: Erin Patterson Medical record number: [id number] Date of birth: 12-25-1932 Age: 84 y.o. Gender: female  Primary Care Provider: Jackie Plum, MD Consultants: none Code Status: Full  Needs Vietnamese interpreter  Pt Overview and Major Events to Date:  6/1 admitted for hypertensive urgency  Assessment and Plan: Dayrin Netasha Wehrli is a 84 y.o. female with a past medical history significant for hypertension, hyperlipidemia, CAD and previous MI who presented with weakness and dizziness in the setting of hypertensive urgency.  Multiple strokes, likely embolic source. Code stroke called 6/2 at 1500 d/t new-onset right sided weakness and unsteady gait.  CT head and CT angio head/neck negative; however, multiple acute strokes found on MRI brain.  TTE significant for EF 65-70%, no cardiac source of emboli identified. - appreciate neuro recs - watch to prevent hypotension - Plavix 75 mg + ASA 81 mg for three weeks, then ASA alone - Lipitor 40 mg daily - PT/OT/SLP - CIR consult - TEE with loop recorder implanted if TEE is negative  Hypertensive urgency, resolved. Dizziness resolved. BP now overcorrected to 110/58 from peak 190/100, goal lowering is 25-30% within first 24h. Orthostatics neg. Trop flat 0.03>0.03>0.04 and without chest pain. Weakness thought to be due to patient chronic deconditioning as has been able ambulate without dizziness/lightheadedness but is requiring family assistance. Appears euvolemic. - consider stopping diltiazem 180mg  po qd - hold HCTZ 12.5 mg and lisinopril 20 mg due to soft BP's and likely new stroke - s/p IV lasix 20mg  x1, now euvolemic - prn hydralazine IV 10mg  q6orn for  SBP >180 or DBP >100 - monitor BP closely, on telemetry as well - echo pending  CAD, h/o MI, HLD. Stable. No chest pain and EKG NSR this morning. Did have leg cramping on admission, none currently, that  could be attributed to atorvastatin. LDL 127 - continue atorvastatin and ASA as above  Hypokalemia, resolved. K 3.6 on 6/3 - monitor BMP  AKI. Creatinine trending up since admission, 0.71>1.26>1.56 on 6/3.  Possibly due to hypertensive urgency/emergency vs subsequent hypotension causing ischemia - will give 500 cc bolus - Fractional excretion of urea to determine prerenal vs. Intrinsic cause of AKI  FEN/GI: heart healthy diet PPx: lovenox  Disposition: pending continued stroke work up and normalization of BP  Subjective:  States that she thinks her right sided weakness and numbness is getting better today.  She has no other complaints.  Objective: Temp:  [97.6 F (36.4 C)-99 F (37.2 C)] 98.3 F (36.8 C) (06/03 0400) Pulse Rate:  [61-73] 63 (06/03 0400) Resp:  [16-20] 16 (06/03 0400) BP: (90-131)/(52-73) 104/62 (06/03 0400) SpO2:  [97 %-98 %] 97 % (06/03 0200) Weight:  [110 lb 10.7 oz (50.2 kg)] 110 lb 10.7 oz (50.2 kg) (06/03 0542) Physical Exam: General: lying in bed comfortably, thin Cardiovascular: RRR, 2/6 systolic murmur Respiratory: CTAB with normal effort, no crackles Abdomen: soft, nontender, nondistended, + bowel sounds Extremities: no edema of lower extremities Neurologic: 4/5 strength bilaterally in lower and upper extremities, no appreciable difference in R vs L, no facial droop or other asymmetry  Laboratory: Recent Labs  Lab 07/26/17 1522 07/27/17 0712 07/28/17 0455  WBC 4.8 7.6 6.8  HGB 13.5 13.5 13.7  HCT 42.1 41.2 40.9  PLT 133* 168 157   Recent Labs  Lab 07/26/17 1028 07/26/17 1522 07/27/17 0712 07/28/17 0455  NA 140  --  136 139  K 3.2*  --  3.6 3.6  CL 105  --  101 102  CO2 23  --  23 24  BUN 9  --  26* 44*  CREATININE 0.76 0.71 1.26* 1.56*  CALCIUM 8.9  --  9.8 10.0  GLUCOSE 106*  --  115* 114*    Lipid Panel     Component Value Date/Time   CHOL 201 (H) 07/27/2017 0712   TRIG 44 07/27/2017 0712   HDL 66 07/27/2017 0712    CHOLHDL 3.0 07/27/2017 0712   VLDL 9 07/27/2017 0712   LDLCALC 126 (H) 07/27/2017 0712    Imaging/Diagnostic Tests: Ct Angio Head W Or Wo Contrast  Result Date: 07/27/2017 CLINICAL DATA:  Right-sided weakness. EXAM: CT ANGIOGRAPHY HEAD AND NECK TECHNIQUE: Multidetector CT imaging of the head and neck was performed using the standard protocol during bolus administration of intravenous contrast. Multiplanar CT image reconstructions and MIPs were obtained to evaluate the vascular anatomy. Carotid stenosis measurements (when applicable) are obtained utilizing NASCET criteria, using the distal internal carotid diameter as the denominator. CONTRAST:  50mL ISOVUE-370 IOPAMIDOL (ISOVUE-370) INJECTION 76% COMPARISON:  None. FINDINGS: CTA NECK FINDINGS Aortic arch: Standard 3 vessel aortic arch with moderate diffuse atherosclerotic plaque. No significant arch vessel origin stenosis. Slightly ectatic appearance of the distal ascending aorta, 3.6 cm diameter. Right carotid system: Patent without evidence of stenosis or dissection. Minimal atherosclerotic plaque at the carotid bifurcation. Left carotid system: Patent without evidence of stenosis or dissection. Minimal atherosclerotic plaque at the carotid bifurcation. Vertebral arteries: Patent and codominant without evidence of significant stenosis or dissection. Skeleton: Mild lower cervical disc degeneration. Other neck: No mass or lymph node enlargement. Upper chest: Clear lung apices. Review of the MIP images confirms the above findings CTA HEAD FINDINGS Anterior circulation: The internal carotid arteries are patent from skull base to carotid termini with mild-to-moderate nonstenotic siphon atherosclerosis bilaterally. ACAs and MCAs are patent without evidence of proximal branch occlusion. There is a moderate left M1 stenosis. The right A1 segment is dominant. No aneurysm. Posterior circulation: The intracranial vertebral arteries are patent to the basilar. Right V4  segment calcified atherosclerosis results in mild stenosis. Patent left PICA, right AICA, and bilateral SCA origins are visualized. The basilar artery is patent with mild atherosclerotic type irregularity but no significant stenosis. Posterior communicating arteries are not identified. PCAs are patent without evidence of significant proximal stenosis. No aneurysm. Venous sinuses: Small left sigmoid sinus, poorly visualized and with evidence of slow flow on a 2017 brain MRI. Major dural venous sinuses are otherwise grossly patent allowing for arterial phase dominant contrast timing. Anatomic variants: Hypoplastic left A1. Delayed phase: Not performed. Review of the MIP images confirms the above findings IMPRESSION: 1. No emergent large vessel occlusion. 2. Intracranial atherosclerosis resulting in moderate left M1 and mild right V4 stenoses. 3. Widely patent carotid arteries. 4.  Aortic Atherosclerosis (ICD10-I70.0). These results were communicated to Dr. Amada Jupiter at 4:24 pm on 07/27/2017 by text page via the [facility] messaging system. Electronically Signed   By: Sebastian Ache M.D.   On: 07/27/2017 16:37   Ct Angio Neck W Or Wo Contrast  Result Date: 07/27/2017 CLINICAL DATA:  Right-sided weakness. EXAM: CT ANGIOGRAPHY HEAD AND NECK TECHNIQUE: Multidetector CT imaging of the head and neck was performed using the standard protocol during bolus administration of intravenous contrast. Multiplanar CT image reconstructions and MIPs were obtained to evaluate the vascular anatomy. Carotid stenosis measurements (when applicable) are obtained utilizing NASCET criteria, using the distal internal carotid diameter as  the denominator. CONTRAST:  85mL ISOVUE-370 IOPAMIDOL (ISOVUE-370) INJECTION 76% COMPARISON:  None. FINDINGS: CTA NECK FINDINGS Aortic arch: Standard 3 vessel aortic arch with moderate diffuse atherosclerotic plaque. No significant arch vessel origin stenosis. Slightly ectatic appearance of the distal ascending  aorta, 3.6 cm diameter. Right carotid system: Patent without evidence of stenosis or dissection. Minimal atherosclerotic plaque at the carotid bifurcation. Left carotid system: Patent without evidence of stenosis or dissection. Minimal atherosclerotic plaque at the carotid bifurcation. Vertebral arteries: Patent and codominant without evidence of significant stenosis or dissection. Skeleton: Mild lower cervical disc degeneration. Other neck: No mass or lymph node enlargement. Upper chest: Clear lung apices. Review of the MIP images confirms the above findings CTA HEAD FINDINGS Anterior circulation: The internal carotid arteries are patent from skull base to carotid termini with mild-to-moderate nonstenotic siphon atherosclerosis bilaterally. ACAs and MCAs are patent without evidence of proximal branch occlusion. There is a moderate left M1 stenosis. The right A1 segment is dominant. No aneurysm. Posterior circulation: The intracranial vertebral arteries are patent to the basilar. Right V4 segment calcified atherosclerosis results in mild stenosis. Patent left PICA, right AICA, and bilateral SCA origins are visualized. The basilar artery is patent with mild atherosclerotic type irregularity but no significant stenosis. Posterior communicating arteries are not identified. PCAs are patent without evidence of significant proximal stenosis. No aneurysm. Venous sinuses: Small left sigmoid sinus, poorly visualized and with evidence of slow flow on a 2017 brain MRI. Major dural venous sinuses are otherwise grossly patent allowing for arterial phase dominant contrast timing. Anatomic variants: Hypoplastic left A1. Delayed phase: Not performed. Review of the MIP images confirms the above findings IMPRESSION: 1. No emergent large vessel occlusion. 2. Intracranial atherosclerosis resulting in moderate left M1 and mild right V4 stenoses. 3. Widely patent carotid arteries. 4.  Aortic Atherosclerosis (ICD10-I70.0). These results  were communicated to Dr. Amada Jupiter at 4:24 pm on 07/27/2017 by text page via the [facility] messaging system. Electronically Signed   By: Sebastian Ache M.D.   On: 07/27/2017 16:37   Ct Head Code Stroke Wo Contrast  Result Date: 07/27/2017 CLINICAL DATA:  Code stroke.  Right-sided weakness. EXAM: CT HEAD WITHOUT CONTRAST TECHNIQUE: Contiguous axial images were obtained from the base of the skull through the vertex without intravenous contrast. COMPARISON:  Brain MRI 07/12/2015 FINDINGS: Brain: There is no evidence of acute large territory infarct, intracranial hemorrhage, mass, midline shift, or extra-axial fluid collection. There are small chronic infarcts in the cerebellum bilaterally including a new 3 mm infarct on the left. A dilated perivascular space is again noted inferiorly in the right basal ganglia. A subcentimeter low-density focus in the left lentiform nucleus is unchanged from the prior MRI and was without significant surrounding gliosis on that examination suggesting that this may also represent a dilated perivascular space rather than a chronic lacunar infarct. Generalized cerebral atrophy is mild for age. Vascular: Calcified atherosclerosis at the skull base. No hyperdense vessel. Skull: No fracture or destructive osseous lesion. Sinuses/Orbits: Visualized paranasal sinuses and mastoid air cells are clear. Prior right cataract extraction is noted. Other: None. ASPECTS [facility] Stroke Program Early CT Score) - Ganglionic level infarction (caudate, lentiform nuclei, internal capsule, insula, M1-M3 cortex): 7 - Supraganglionic infarction (M4-M6 cortex): 3 Total score (0-10 with 10 being normal): 10 IMPRESSION: 1. No evidence of acute intracranial abnormality. 2. ASPECTS is 10. These results were communicated to Dr. Amada Jupiter at 3:56 pm on 07/27/2017 by text page via the [facility] messaging system. Electronically Signed  By: Sebastian Ache M.D.   On: 07/27/2017 15:57    Lennox Solders, MD 07/28/2017, 6:31  AM PGY-1, [place] [facility] FPTS Intern pager: [phone], text pages welcome

## 2017-07-28 NOTE — Evaluation (Signed)
Occupational Therapy Evaluation Patient Details Name: Albertine Cavero MRN: [id number] DOB: Jul 21, 1932 Today's Date: 07/28/2017    History of Present Illness Malissie Kalah Mansker is a 84 y.o. female with a past medical history significant for hypertension, hyperlipidemia, CAD and previous MI who presented with weakness and dizziness in the setting of hypertensive urgency. Noting strength and gait assymetries on PT exam, concern for CVA with dizziness and recent hypertension, and notified MD   Clinical Impression   Patient presenting with decreased I in self care, balance, functional mobility/transfers, safety awareness, R UE/LE strengthening. Patient was I  PTA without use of AD. She lives with AD. Patient currently functioning at min - mod A overall. Patient will benefit from acute OT to increase overall independence in the areas of ADLs, functional mobility, and safety in order to safely discharge to next venue of care.    Follow Up Recommendations  CIR;Supervision/Assistance - 24 hour    Equipment Recommendations  Other (comment)(defer to next venue of care)    Recommendations for Other Services Other (comment)(none)     Precautions / Restrictions Precautions Precautions: Fall Restrictions Weight Bearing Restrictions: No      Mobility Bed Mobility Overal bed mobility: Needs Assistance Bed Mobility: Supine to Sit     Supine to sit: Min assist     General bed mobility comments: Min assist to help scoot hips to EOB   Transfers Overall transfer level: Needs assistance Equipment used: 1 person hand held assist;Rolling walker (2 wheeled) Transfers: Sit to/from Stand Sit to Stand: Min assist          Balance Overall balance assessment: Needs assistance Sitting-balance support: Feet supported Sitting balance-Leahy Scale: Good     Standing balance support: Bilateral upper extremity supported Standing balance-Leahy Scale: Poor Standing balance comment: reliant on external  support        ADL either performed or assessed with clinical judgement   ADL Overall ADL's : Needs assistance/impaired Eating/Feeding: Set up   Grooming: Wash/dry hands;Wash/dry face;Oral care;Set up;Supervision/safety;Sitting   Upper Body Bathing: Set up;Supervision/ safety;Sitting   Lower Body Bathing: Moderate assistance;Sit to/from stand   Upper Body Dressing : Set up;Supervision/safety;Sitting   Lower Body Dressing: Moderate assistance;Sit to/from stand   Toilet Transfer: Minimal assistance;RW;Regular Toilet;Grab bars   Toileting- Clothing Manipulation and Hygiene: Minimal assistance;Sit to/from stand       Functional mobility during ADLs: Minimal assistance;Rolling walker General ADL Comments: Pt ambulating with min - mod A to bathroom with use of RW. Pt able to perform hygiene and clothing management after toileting with steady assistance.      Vision Patient Visual Report: No change from baseline              Pertinent Vitals/Pain Pain Assessment: No/denies pain     Hand Dominance Right   Extremity/Trunk Assessment Upper Extremity Assessment Upper Extremity Assessment: LUE deficits/detail RUE Deficits / Details: Grossly 4+/5 throughout LUE Deficits / Details: Pt with reports of weakness, and gross weakness with L shoulder flexion compared to R; Grossly 3+/5 biceps   Lower Extremity Assessment Lower Extremity Assessment: Defer to PT evaluation       Communication Communication Communication: No difficulties;Prefers language other than English   Cognition Arousal/Alertness: Awake/alert Behavior During Therapy: WFL for tasks assessed/performed Overall Cognitive Status: Within Functional Limits for tasks assessed                    Home Living Family/patient expects to be discharged to:: Private residence Living Arrangements:  Spouse/significant other;Children Available Help at Discharge: Family;Available 24 hours/day Type of Home:  Apartment Home Access: Stairs to enter Entrance Stairs-Number of Steps: 4 Entrance Stairs-Rails: Right Home Layout: One level               Home Equipment: None          Prior Functioning/Environment Level of Independence: Independent                 OT Problem List: Decreased strength;Impaired balance (sitting and/or standing);Decreased cognition;Decreased safety awareness;Decreased activity tolerance;Decreased coordination;Decreased knowledge of use of DME or AE;Impaired sensation;Impaired UE functional use      OT Treatment/Interventions: Self-care/ADL training;Manual therapy;Therapeutic exercise;Patient/family education;Neuromuscular education;Balance training;Energy conservation;Therapeutic activities;Cognitive remediation/compensation;DME and/or AE instruction    OT Goals(Current goals can be found in the care plan section) Acute Rehab OT Goals Patient Stated Goal: none stated ADL Goals Pt Will Perform Grooming: with supervision Pt Will Perform Upper Body Bathing: with supervision Pt Will Perform Lower Body Bathing: with supervision Pt Will Perform Upper Body Dressing: with supervision Pt Will Perform Lower Body Dressing: with supervision Pt Will Transfer to Toilet: with supervision Pt Will Perform Toileting - Clothing Manipulation and hygiene: with supervision Pt Will Perform Tub/Shower Transfer: with supervision  OT Frequency: Min 2X/week   Barriers to D/C: Other (comment)  none known at this time       Co-evaluation PT/OT/SLP Co-Evaluation/Treatment: Yes Reason for Co-Treatment: For patient/therapist safety;To address functional/ADL transfers   OT goals addressed during session: ADL's and self-care      AM-PAC PT "6 Clicks" Daily Activity     Outcome Measure Help from another person eating meals?: A Little Help from another person taking care of personal grooming?: A Little Help from another person toileting, which includes using toliet, bedpan,  or urinal?: A Little Help from another person bathing (including washing, rinsing, drying)?: A Little Help from another person to put on and taking off regular upper body clothing?: A Little Help from another person to put on and taking off regular lower body clothing?: A Little 6 Click Score: 18   End of Session Equipment Utilized During Treatment: Rolling walker;Gait belt Nurse Communication: Mobility status  Activity Tolerance: Patient tolerated treatment well Patient left: in bed;with call bell/phone within reach;with bed alarm set;with family/visitor present  OT Visit Diagnosis: Unsteadiness on feet (R26.81);Muscle weakness (generalized) (M62.81);Hemiplegia and hemiparesis Hemiplegia - Right/Left: Right Hemiplegia - dominant/non-dominant: Dominant Hemiplegia - caused by: Cerebral infarction                Time: 1610-9604 OT Time Calculation (min): 24 min Charges:  OT General Charges $OT Visit: 1 Visit OT Evaluation $OT Eval Moderate Complexity: 1 Mod G-Codes:      Oland Arquette P, MS, OTR/L, CBIS 07/28/2017, 4:34 PM

## 2017-07-28 NOTE — Progress Notes (Signed)
  Echocardiogram 2D Echocardiogram has been performed.  Patient did not understand holding breath to improve image quality.   Brindy Higginbotham L Androw 07/28/2017, 9:41 AM

## 2017-07-28 NOTE — Consult Note (Signed)
Physical Medicine and Rehabilitation Consult  Reason for Consult: Cerebellar stroke with functional deficits.  Referring Physician: Dr. Leveda Anna    HPI: Erin Patterson is a 84 y.o. non english speaking [place] female with history of HTN, CAD, CVA about 15 years ago?  who was admitted on 07/26/17 with weakness and dizziness. History taken from chart review.  CT head reviewed, unremarkable for acute intracranial process.  Patient was noted to be confused with reports that right weakness was at baseline. MRI brain done revealing acute left thalamic infarct, subcentimeter acute infarct in posterior left and superior right cerebellum suggestive of embolic disease from heart or aorta. 2D echo showed EF 65-70% with no wall abnormality, mild AS and severely calcified MV. Work up underway and DAPT recommended due to intracranial atherosclerosis. PT evaluation done revealing right lean with right foot drag affecting mobility. CIR recommended due to functional deficits.    Review of Systems  Constitutional: Positive for malaise/fatigue. Negative for chills and fever.  HENT: Negative for hearing loss and tinnitus.   Eyes: Negative for blurred vision and double vision.  Respiratory: Negative for cough and shortness of breath.   Cardiovascular: Negative for chest pain and leg swelling.  Gastrointestinal: Negative for heartburn and nausea.  Musculoskeletal: Positive for back pain. Negative for joint pain and myalgias (left shoulder and back pain due to bieng in bed ).  Neurological: Positive for focal weakness. Negative for dizziness and headaches.  (Performed by PA with interpretor, I was not able to perform due to language)   Past Medical History:  Diagnosis Date  . Hypercholesteremia   . Hypertension   . MI (myocardial infarction) (HCC)     No past surgical history on file.    Family Hx: reports all family members have been healthy.     Social History:  Married. Lives with husband  and daughter (no other local family). She was independent PTA without AD. She reports that she has never smoked. She has never used smokeless tobacco. She reports that she does not drink alcohol or use drugs.    Allergies: No Known Allergies    Medications Prior to Admission  Medication Sig Dispense Refill  . atorvastatin (LIPITOR) 20 MG tablet Take 20 mg by mouth daily.    Marland Kitchen diltiazem (CARTIA XT) 180 MG 24 hr capsule Take 180 mg by mouth daily.    Marland Kitchen lisinopril-hydrochlorothiazide (PRINZIDE,ZESTORETIC) 20-12.5 MG tablet Take 1 tablet by mouth 2 (two) times daily.    . meloxicam (MOBIC) 7.5 MG tablet Take 7.5 mg by mouth daily.  2  . potassium chloride SA (K-DUR,KLOR-CON) 20 MEQ tablet Take 1 tablet (20 mEq total) by mouth daily. (Patient not taking: Reported on 07/26/2017) 5 tablet 0    Home: Home Living Family/patient expects to be discharged to:: Private residence Living Arrangements: Spouse/significant other, Children Available Help at Discharge: Family, Available 24 hours/day(Spouse 24 hours, limited assist, but can provide S) Type of Home: Apartment Home Access: Stairs to enter Entrance Stairs-Number of Steps: 4 Entrance Stairs-Rails: Right Home Layout: One level Home Equipment: None  Functional History: Prior Function Level of Independence: Independent Functional Status:  Mobility: Bed Mobility Overal bed mobility: Needs Assistance Bed Mobility: Supine to Sit Supine to sit: Min assist General bed mobility comments: Min assist to help scoot hips to EOB  Transfers Overall transfer level: Needs assistance Equipment used: 1 person hand held assist, Rolling walker (2 wheeled) Transfers: Sit to/from Stand Sit to Stand: Min assist, Mod assist  General transfer comment: Min assist to steady; unstable in initial stand, requiring mod assist to keep balance; one instance of sitting back to bed without warning Ambulation/Gait Ambulation/Gait assistance: Mod assist Ambulation  Distance (Feet): 20 Feet Assistive device: Rolling walker (2 wheeled) Gait Pattern/deviations: Decreased step length - right, Decreased stance time - right, Decreased weight shift to left, Staggering right(noted occasional R foot drag) General Gait Details: Right lean pervasive throughout walk in room; short step length R leading to loss of balance to Right; Right lean becomes more pronounced with fatigue Gait velocity: slowed    ADL:    Cognition: Cognition Overall Cognitive Status: Within Functional Limits for tasks assessed Orientation Level: Oriented to person, Oriented to place, Disoriented to time, Disoriented to situation Cognition Arousal/Alertness: Awake/alert Behavior During Therapy: WFL for tasks assessed/performed Overall Cognitive Status: Within Functional Limits for tasks assessed   Blood pressure 114/60, pulse 63, temperature 98.3 F (36.8 C), temperature source Oral, resp. rate 18, height 5\' 4"  (1.626 m), weight 50.2 kg (110 lb 10.7 oz), SpO2 97 %. Physical Exam  Nursing note and vitals reviewed. Constitutional: She appears well-developed.  Frail  HENT:  Head: Normocephalic and atraumatic.  Eyes: EOM are normal. Right eye exhibits no discharge. Left eye exhibits no discharge.  Neck: Normal range of motion. Neck supple.  Cardiovascular: Normal rate and regular rhythm.  Respiratory: Effort normal and breath sounds normal.  GI: Soft. Bowel sounds are normal.  Musculoskeletal:  No edema or tenderness in extremities  Neurological: She is alert.  Soft voice.  Oriented to self and place as hospital. Unable to state city, age or DOB. Looked to husband for answers to biographic questions (per PA with interpretor).  Able to follow demonstrates commands with assistance of husband. Motor: Grossly RUE/RLE: 4--4/5 proximal to distal.  Ataxia RUE.  LUE/LLE: 4/5 proximal to distal  Skin: Skin is warm and dry.  Psychiatric:  Unable to assess due to language    Results  for orders placed or performed during the hospital encounter of 07/26/17 (from the past 24 hour(s))  CBC     Status: Abnormal   Collection Time: 07/28/17  4:55 AM  Result Value Ref Range   WBC 6.8 4.0 - 10.5 K/uL   RBC 5.33 (H) 3.87 - 5.11 MIL/uL   Hemoglobin 13.7 12.0 - 15.0 g/dL   HCT 16.1 09.6 - 04.5 %   MCV 76.7 (L) 78.0 - 100.0 fL   MCH 25.7 (L) 26.0 - 34.0 pg   MCHC 33.5 30.0 - 36.0 g/dL   RDW 40.9 (H) 81.1 - 91.4 %   Platelets 157 150 - 400 K/uL  Basic metabolic panel     Status: Abnormal   Collection Time: 07/28/17  4:55 AM  Result Value Ref Range   Sodium 139 135 - 145 mmol/L   Potassium 3.6 3.5 - 5.1 mmol/L   Chloride 102 101 - 111 mmol/L   CO2 24 22 - 32 mmol/L   Glucose, Bld 114 (H) 65 - 99 mg/dL   BUN 44 (H) 6 - 20 mg/dL   Creatinine, Ser 7.82 (H) 0.44 - 1.00 mg/dL   Calcium 95.6 8.9 - 21.3 mg/dL   GFR calc non Af Amer 30 (L) >60 mL/min   GFR calc Af Amer 35 (L) >60 mL/min   Anion gap 13 5 - 15  Hemoglobin A1c     Status: None   Collection Time: 07/28/17  4:55 AM  Result Value Ref Range   Hgb A1c MFr Bld  5.4 4.8 - 5.6 %   Mean Plasma Glucose 108.28 mg/dL  Rapid urine drug screen (hospital performed)     Status: None   Collection Time: 07/28/17  6:43 AM  Result Value Ref Range   Opiates NONE DETECTED NONE DETECTED   Cocaine NONE DETECTED NONE DETECTED   Benzodiazepines NONE DETECTED NONE DETECTED   Amphetamines NONE DETECTED NONE DETECTED   Tetrahydrocannabinol NONE DETECTED NONE DETECTED   Barbiturates NONE DETECTED NONE DETECTED   Ct Angio Head W Or Wo Contrast  Result Date: 07/27/2017 CLINICAL DATA:  Right-sided weakness. EXAM: CT ANGIOGRAPHY HEAD AND NECK TECHNIQUE: Multidetector CT imaging of the head and neck was performed using the standard protocol during bolus administration of intravenous contrast. Multiplanar CT image reconstructions and MIPs were obtained to evaluate the vascular anatomy. Carotid stenosis measurements (when applicable) are obtained  utilizing NASCET criteria, using the distal internal carotid diameter as the denominator. CONTRAST:  74mL ISOVUE-370 IOPAMIDOL (ISOVUE-370) INJECTION 76% COMPARISON:  None. FINDINGS: CTA NECK FINDINGS Aortic arch: Standard 3 vessel aortic arch with moderate diffuse atherosclerotic plaque. No significant arch vessel origin stenosis. Slightly ectatic appearance of the distal ascending aorta, 3.6 cm diameter. Right carotid system: Patent without evidence of stenosis or dissection. Minimal atherosclerotic plaque at the carotid bifurcation. Left carotid system: Patent without evidence of stenosis or dissection. Minimal atherosclerotic plaque at the carotid bifurcation. Vertebral arteries: Patent and codominant without evidence of significant stenosis or dissection. Skeleton: Mild lower cervical disc degeneration. Other neck: No mass or lymph node enlargement. Upper chest: Clear lung apices. Review of the MIP images confirms the above findings CTA HEAD FINDINGS Anterior circulation: The internal carotid arteries are patent from skull base to carotid termini with mild-to-moderate nonstenotic siphon atherosclerosis bilaterally. ACAs and MCAs are patent without evidence of proximal branch occlusion. There is a moderate left M1 stenosis. The right A1 segment is dominant. No aneurysm. Posterior circulation: The intracranial vertebral arteries are patent to the basilar. Right V4 segment calcified atherosclerosis results in mild stenosis. Patent left PICA, right AICA, and bilateral SCA origins are visualized. The basilar artery is patent with mild atherosclerotic type irregularity but no significant stenosis. Posterior communicating arteries are not identified. PCAs are patent without evidence of significant proximal stenosis. No aneurysm. Venous sinuses: Small left sigmoid sinus, poorly visualized and with evidence of slow flow on a 2017 brain MRI. Major dural venous sinuses are otherwise grossly patent allowing for arterial  phase dominant contrast timing. Anatomic variants: Hypoplastic left A1. Delayed phase: Not performed. Review of the MIP images confirms the above findings IMPRESSION: 1. No emergent large vessel occlusion. 2. Intracranial atherosclerosis resulting in moderate left M1 and mild right V4 stenoses. 3. Widely patent carotid arteries. 4.  Aortic Atherosclerosis (ICD10-I70.0). These results were communicated to Dr. Amada Jupiter at 4:24 pm on 07/27/2017 by text page via the [facility] messaging system. Electronically Signed   By: Sebastian Ache M.D.   On: 07/27/2017 16:37   Ct Angio Neck W Or Wo Contrast  Result Date: 07/27/2017 CLINICAL DATA:  Right-sided weakness. EXAM: CT ANGIOGRAPHY HEAD AND NECK TECHNIQUE: Multidetector CT imaging of the head and neck was performed using the standard protocol during bolus administration of intravenous contrast. Multiplanar CT image reconstructions and MIPs were obtained to evaluate the vascular anatomy. Carotid stenosis measurements (when applicable) are obtained utilizing NASCET criteria, using the distal internal carotid diameter as the denominator. CONTRAST:  52mL ISOVUE-370 IOPAMIDOL (ISOVUE-370) INJECTION 76% COMPARISON:  None. FINDINGS: CTA NECK FINDINGS Aortic arch: Standard 3  vessel aortic arch with moderate diffuse atherosclerotic plaque. No significant arch vessel origin stenosis. Slightly ectatic appearance of the distal ascending aorta, 3.6 cm diameter. Right carotid system: Patent without evidence of stenosis or dissection. Minimal atherosclerotic plaque at the carotid bifurcation. Left carotid system: Patent without evidence of stenosis or dissection. Minimal atherosclerotic plaque at the carotid bifurcation. Vertebral arteries: Patent and codominant without evidence of significant stenosis or dissection. Skeleton: Mild lower cervical disc degeneration. Other neck: No mass or lymph node enlargement. Upper chest: Clear lung apices. Review of the MIP images confirms the above  findings CTA HEAD FINDINGS Anterior circulation: The internal carotid arteries are patent from skull base to carotid termini with mild-to-moderate nonstenotic siphon atherosclerosis bilaterally. ACAs and MCAs are patent without evidence of proximal branch occlusion. There is a moderate left M1 stenosis. The right A1 segment is dominant. No aneurysm. Posterior circulation: The intracranial vertebral arteries are patent to the basilar. Right V4 segment calcified atherosclerosis results in mild stenosis. Patent left PICA, right AICA, and bilateral SCA origins are visualized. The basilar artery is patent with mild atherosclerotic type irregularity but no significant stenosis. Posterior communicating arteries are not identified. PCAs are patent without evidence of significant proximal stenosis. No aneurysm. Venous sinuses: Small left sigmoid sinus, poorly visualized and with evidence of slow flow on a 2017 brain MRI. Major dural venous sinuses are otherwise grossly patent allowing for arterial phase dominant contrast timing. Anatomic variants: Hypoplastic left A1. Delayed phase: Not performed. Review of the MIP images confirms the above findings IMPRESSION: 1. No emergent large vessel occlusion. 2. Intracranial atherosclerosis resulting in moderate left M1 and mild right V4 stenoses. 3. Widely patent carotid arteries. 4.  Aortic Atherosclerosis (ICD10-I70.0). These results were communicated to Dr. Amada Jupiter at 4:24 pm on 07/27/2017 by text page via the [facility] messaging system. Electronically Signed   By: Sebastian Ache M.D.   On: 07/27/2017 16:37   Mr Brain Wo Contrast  Result Date: 07/28/2017 CLINICAL DATA:  Followup stroke. Right-sided weakness presenting yesterday. EXAM: MRI HEAD WITHOUT CONTRAST TECHNIQUE: Multiplanar, multiecho pulse sequences of the brain and surrounding structures were obtained without intravenous contrast. COMPARISON:  CT studies done yesterday.  MRI 07/12/2015. FINDINGS: Brain: Diffusion imaging  shows an acute small vessel stroke within the periphery of the inferior cerebellum on the left and superior cerebellum on the right. There is a 1 cm acute infarction in the left lateral thalamus. Punctate acute infarction in the right caudate/adjacent white matter tracks. No other acute infarction. No evidence of swelling or hemorrhage. Chronic small-vessel ischemic changes affect the pons. There are a few old small vessel cerebellar infarctions. There chronic small-vessel ischemic changes of the thalami, basal ganglia and hemispheric white matter. No large vessel territory infarction. No mass lesion, hemorrhage, hydrocephalus or extra-axial collection. Vascular: Major vessels at the base of the brain show flow. Skull and upper cervical spine: Negative Sinuses/Orbits: Clear/normal Other: None IMPRESSION: 1 cm acute infarction in the left lateral thalamus. Subcentimeter acute infarction in the posterior inferior cerebellum on the left and in the superior cerebellum on the right. Punctate acute infarction within the right caudate/adjacent white matter tracks. Anterior and posterior circulation insults suggest embolic disease from the heart or ascending aorta. Electronically Signed   By: Paulina Fusi M.D.   On: 07/28/2017 08:38   Ct Head Code Stroke Wo Contrast  Result Date: 07/27/2017 CLINICAL DATA:  Code stroke.  Right-sided weakness. EXAM: CT HEAD WITHOUT CONTRAST TECHNIQUE: Contiguous axial images were obtained from the base  of the skull through the vertex without intravenous contrast. COMPARISON:  Brain MRI 07/12/2015 FINDINGS: Brain: There is no evidence of acute large territory infarct, intracranial hemorrhage, mass, midline shift, or extra-axial fluid collection. There are small chronic infarcts in the cerebellum bilaterally including a new 3 mm infarct on the left. A dilated perivascular space is again noted inferiorly in the right basal ganglia. A subcentimeter low-density focus in the left lentiform  nucleus is unchanged from the prior MRI and was without significant surrounding gliosis on that examination suggesting that this may also represent a dilated perivascular space rather than a chronic lacunar infarct. Generalized cerebral atrophy is mild for age. Vascular: Calcified atherosclerosis at the skull base. No hyperdense vessel. Skull: No fracture or destructive osseous lesion. Sinuses/Orbits: Visualized paranasal sinuses and mastoid air cells are clear. Prior right cataract extraction is noted. Other: None. ASPECTS [facility] Stroke Program Early CT Score) - Ganglionic level infarction (caudate, lentiform nuclei, internal capsule, insula, M1-M3 cortex): 7 - Supraganglionic infarction (M4-M6 cortex): 3 Total score (0-10 with 10 being normal): 10 IMPRESSION: 1. No evidence of acute intracranial abnormality. 2. ASPECTS is 10. These results were communicated to Dr. Amada Jupiter at 3:56 pm on 07/27/2017 by text page via the [facility] messaging system. Electronically Signed   By: Sebastian Ache M.D.   On: 07/27/2017 15:57     Assessment/Plan: Diagnosis: Acute left thalamic, posterior left and superior right cerebellum infarcts Labs and images independently reviewed.  Records reviewed and summated above.  1. Does the need for close, 24 hr/day medical supervision in concert with the patient's rehab needs make it unreasonable for this patient to be served in a less intensive setting? Yes  2. Co-Morbidities requiring supervision/potential complications: HTN (monitor and provide prns in accordance with increased physical exertion and pain), CAD, CVA about 15 years ago with residual deficits, AKI (avoid nephrotoxic meds) 3. Due to safety, disease management, medication administration and patient education, does the patient require 24 hr/day rehab nursing? Yes 4. Does the patient require coordinated care of a physician, rehab nurse, PT (1-2 hrs/day, 5 days/week), OT (1-2 hrs/day, 5 days/week) and SLP (1-2 hrs/day, 5  days/week) to address physical and functional deficits in the context of the above medical diagnosis(es)? Yes Addressing deficits in the following areas: balance, endurance, locomotion, strength, transferring, bathing, dressing, toileting, speech and psychosocial support 5. Can the patient actively participate in an intensive therapy program of at least 3 hrs of therapy per day at least 5 days per week? Yes 6. The potential for patient to make measurable gains while on inpatient rehab is excellent 7. Anticipated functional outcomes upon discharge from inpatient rehab are supervision and min assist  with PT, supervision and min assist with OT, modified independent and supervision with SLP. 8. Estimated rehab length of stay to reach the above functional goals is: 11-16 days. 9. Anticipated D/C setting: Home 10. Anticipated post D/C treatments: HH therapy and Home excercise program 11. Overall Rehab/Functional Prognosis: good  RECOMMENDATIONS: This patient's condition is appropriate for continued rehabilitative care in the following setting: CIR if husband able to provide assistance at discharge (UE amputation). Patient has agreed to participate in recommended program. Potentially Note that insurance prior authorization may be required for reimbursement for recommended care.  Comment: Rehab Admissions Coordinator to follow up.   I have personally performed a face to face diagnostic evaluation, including, but not limited to relevant history and physical exam findings, of this patient and developed relevant assessment and plan.  Additionally, I  have reviewed and concur with the physician assistant's documentation above.   Maryla Morrow, MD, ABPMR Jacquelynn Cree, PA-C 07/28/2017

## 2017-07-28 NOTE — Progress Notes (Addendum)
STROKE TEAM PROGRESS NOTE   INTERVAL HISTORY Her husband is at the bedside.  Using interpreter at bedside, discovered patient had history of stroke when she was in [place].  Etiology of stroke never identified.  Connection with interpreter was poor and had to be stopped prematurely.  Patient able to mimic and follow simple commands in order to do physical assessment.  We did discuss that she had a stroke, but the etiology was felt to be embolic and that a TEE and loop recorder were recommended.  Let her hold and feel an implantable loop recorder like she will have.  Vitals:   07/28/17 0400 07/28/17 0542 07/28/17 0847 07/28/17 1228  BP: 104/62  114/60 110/66  Pulse: 63  63 68  Resp: 16  18 18   Temp: 98.3 F (36.8 C)  98.3 F (36.8 C) 97.9 F (36.6 C)  TempSrc: Oral  Oral Oral  SpO2:   97% 99%  Weight:  50.2 kg (110 lb 10.7 oz)    Height:        CBC:  Recent Labs  Lab 07/27/17 0712 07/28/17 0455  WBC 7.6 6.8  HGB 13.5 13.7  HCT 41.2  39.4 40.9  MCV 77.4* 76.7*  PLT 168 157    Basic Metabolic Panel:  Recent Labs  Lab 07/26/17 1522 07/27/17 0712 07/28/17 0455  NA  --  136 139  K  --  3.6 3.6  CL  --  101 102  CO2  --  23 24  GLUCOSE  --  115* 114*  BUN  --  26* 44*  CREATININE 0.71 1.26* 1.56*  CALCIUM  --  9.8 10.0  MG 1.7  --   --   PHOS 2.4*  --   --    Lipid Panel:     Component Value Date/Time   CHOL 201 (H) 07/27/2017 0712   TRIG 44 07/27/2017 0712   HDL 66 07/27/2017 0712   CHOLHDL 3.0 07/27/2017 0712   VLDL 9 07/27/2017 0712   LDLCALC 126 (H) 07/27/2017 0712   HgbA1c:  Lab Results  Component Value Date   HGBA1C 5.4 07/28/2017   Urine Drug Screen:     Component Value Date/Time   LABOPIA NONE DETECTED 07/28/2017 0643   COCAINSCRNUR NONE DETECTED 07/28/2017 0643   LABBENZ NONE DETECTED 07/28/2017 0643   AMPHETMU NONE DETECTED 07/28/2017 0643   THCU NONE DETECTED 07/28/2017 0643   LABBARB NONE DETECTED 07/28/2017 0643    Alcohol Level No  results found for: ETH  IMAGING Ct Angio Head W Or Wo Contrast  Result Date: 07/27/2017 CLINICAL DATA:  Right-sided weakness. EXAM: CT ANGIOGRAPHY HEAD AND NECK TECHNIQUE: Multidetector CT imaging of the head and neck was performed using the standard protocol during bolus administration of intravenous contrast. Multiplanar CT image reconstructions and MIPs were obtained to evaluate the vascular anatomy. Carotid stenosis measurements (when applicable) are obtained utilizing NASCET criteria, using the distal internal carotid diameter as the denominator. CONTRAST:  50mL ISOVUE-370 IOPAMIDOL (ISOVUE-370) INJECTION 76% COMPARISON:  None. FINDINGS: CTA NECK FINDINGS Aortic arch: Standard 3 vessel aortic arch with moderate diffuse atherosclerotic plaque. No significant arch vessel origin stenosis. Slightly ectatic appearance of the distal ascending aorta, 3.6 cm diameter. Right carotid system: Patent without evidence of stenosis or dissection. Minimal atherosclerotic plaque at the carotid bifurcation. Left carotid system: Patent without evidence of stenosis or dissection. Minimal atherosclerotic plaque at the carotid bifurcation. Vertebral arteries: Patent and codominant without evidence of significant stenosis or dissection. Skeleton: Mild lower  cervical disc degeneration. Other neck: No mass or lymph node enlargement. Upper chest: Clear lung apices. Review of the MIP images confirms the above findings CTA HEAD FINDINGS Anterior circulation: The internal carotid arteries are patent from skull base to carotid termini with mild-to-moderate nonstenotic siphon atherosclerosis bilaterally. ACAs and MCAs are patent without evidence of proximal branch occlusion. There is a moderate left M1 stenosis. The right A1 segment is dominant. No aneurysm. Posterior circulation: The intracranial vertebral arteries are patent to the basilar. Right V4 segment calcified atherosclerosis results in mild stenosis. Patent left PICA, right  AICA, and bilateral SCA origins are visualized. The basilar artery is patent with mild atherosclerotic type irregularity but no significant stenosis. Posterior communicating arteries are not identified. PCAs are patent without evidence of significant proximal stenosis. No aneurysm. Venous sinuses: Small left sigmoid sinus, poorly visualized and with evidence of slow flow on a 2017 brain MRI. Major dural venous sinuses are otherwise grossly patent allowing for arterial phase dominant contrast timing. Anatomic variants: Hypoplastic left A1. Delayed phase: Not performed. Review of the MIP images confirms the above findings IMPRESSION: 1. No emergent large vessel occlusion. 2. Intracranial atherosclerosis resulting in moderate left M1 and mild right V4 stenoses. 3. Widely patent carotid arteries. 4.  Aortic Atherosclerosis (ICD10-I70.0). These results were communicated to Dr. Amada Jupiter at 4:24 pm on 07/27/2017 by text page via the [facility] messaging system. Electronically Signed   By: Sebastian Ache M.D.   On: 07/27/2017 16:37   Ct Angio Neck W Or Wo Contrast  Result Date: 07/27/2017 CLINICAL DATA:  Right-sided weakness. EXAM: CT ANGIOGRAPHY HEAD AND NECK TECHNIQUE: Multidetector CT imaging of the head and neck was performed using the standard protocol during bolus administration of intravenous contrast. Multiplanar CT image reconstructions and MIPs were obtained to evaluate the vascular anatomy. Carotid stenosis measurements (when applicable) are obtained utilizing NASCET criteria, using the distal internal carotid diameter as the denominator. CONTRAST:  50mL ISOVUE-370 IOPAMIDOL (ISOVUE-370) INJECTION 76% COMPARISON:  None. FINDINGS: CTA NECK FINDINGS Aortic arch: Standard 3 vessel aortic arch with moderate diffuse atherosclerotic plaque. No significant arch vessel origin stenosis. Slightly ectatic appearance of the distal ascending aorta, 3.6 cm diameter. Right carotid system: Patent without evidence of stenosis or  dissection. Minimal atherosclerotic plaque at the carotid bifurcation. Left carotid system: Patent without evidence of stenosis or dissection. Minimal atherosclerotic plaque at the carotid bifurcation. Vertebral arteries: Patent and codominant without evidence of significant stenosis or dissection. Skeleton: Mild lower cervical disc degeneration. Other neck: No mass or lymph node enlargement. Upper chest: Clear lung apices. Review of the MIP images confirms the above findings CTA HEAD FINDINGS Anterior circulation: The internal carotid arteries are patent from skull base to carotid termini with mild-to-moderate nonstenotic siphon atherosclerosis bilaterally. ACAs and MCAs are patent without evidence of proximal branch occlusion. There is a moderate left M1 stenosis. The right A1 segment is dominant. No aneurysm. Posterior circulation: The intracranial vertebral arteries are patent to the basilar. Right V4 segment calcified atherosclerosis results in mild stenosis. Patent left PICA, right AICA, and bilateral SCA origins are visualized. The basilar artery is patent with mild atherosclerotic type irregularity but no significant stenosis. Posterior communicating arteries are not identified. PCAs are patent without evidence of significant proximal stenosis. No aneurysm. Venous sinuses: Small left sigmoid sinus, poorly visualized and with evidence of slow flow on a 2017 brain MRI. Major dural venous sinuses are otherwise grossly patent allowing for arterial phase dominant contrast timing. Anatomic variants: Hypoplastic left A1. Delayed  phase: Not performed. Review of the MIP images confirms the above findings IMPRESSION: 1. No emergent large vessel occlusion. 2. Intracranial atherosclerosis resulting in moderate left M1 and mild right V4 stenoses. 3. Widely patent carotid arteries. 4.  Aortic Atherosclerosis (ICD10-I70.0). These results were communicated to Dr. Amada Jupiter at 4:24 pm on 07/27/2017 by text page via the [facility]  messaging system. Electronically Signed   By: Sebastian Ache M.D.   On: 07/27/2017 16:37   Mr Brain Wo Contrast  Result Date: 07/28/2017 CLINICAL DATA:  Followup stroke. Right-sided weakness presenting yesterday. EXAM: MRI HEAD WITHOUT CONTRAST TECHNIQUE: Multiplanar, multiecho pulse sequences of the brain and surrounding structures were obtained without intravenous contrast. COMPARISON:  CT studies done yesterday.  MRI 07/12/2015. FINDINGS: Brain: Diffusion imaging shows an acute small vessel stroke within the periphery of the inferior cerebellum on the left and superior cerebellum on the right. There is a 1 cm acute infarction in the left lateral thalamus. Punctate acute infarction in the right caudate/adjacent white matter tracks. No other acute infarction. No evidence of swelling or hemorrhage. Chronic small-vessel ischemic changes affect the pons. There are a few old small vessel cerebellar infarctions. There chronic small-vessel ischemic changes of the thalami, basal ganglia and hemispheric white matter. No large vessel territory infarction. No mass lesion, hemorrhage, hydrocephalus or extra-axial collection. Vascular: Major vessels at the base of the brain show flow. Skull and upper cervical spine: Negative Sinuses/Orbits: Clear/normal Other: None IMPRESSION: 1 cm acute infarction in the left lateral thalamus. Subcentimeter acute infarction in the posterior inferior cerebellum on the left and in the superior cerebellum on the right. Punctate acute infarction within the right caudate/adjacent white matter tracks. Anterior and posterior circulation insults suggest embolic disease from the heart or ascending aorta. Electronically Signed   By: Paulina Fusi M.D.   On: 07/28/2017 08:38   Ct Head Code Stroke Wo Contrast  Result Date: 07/27/2017 CLINICAL DATA:  Code stroke.  Right-sided weakness. EXAM: CT HEAD WITHOUT CONTRAST TECHNIQUE: Contiguous axial images were obtained from the base of the skull through the  vertex without intravenous contrast. COMPARISON:  Brain MRI 07/12/2015 FINDINGS: Brain: There is no evidence of acute large territory infarct, intracranial hemorrhage, mass, midline shift, or extra-axial fluid collection. There are small chronic infarcts in the cerebellum bilaterally including a new 3 mm infarct on the left. A dilated perivascular space is again noted inferiorly in the right basal ganglia. A subcentimeter low-density focus in the left lentiform nucleus is unchanged from the prior MRI and was without significant surrounding gliosis on that examination suggesting that this may also represent a dilated perivascular space rather than a chronic lacunar infarct. Generalized cerebral atrophy is mild for age. Vascular: Calcified atherosclerosis at the skull base. No hyperdense vessel. Skull: No fracture or destructive osseous lesion. Sinuses/Orbits: Visualized paranasal sinuses and mastoid air cells are clear. Prior right cataract extraction is noted. Other: None. ASPECTS [facility] Stroke Program Early CT Score) - Ganglionic level infarction (caudate, lentiform nuclei, internal capsule, insula, M1-M3 cortex): 7 - Supraganglionic infarction (M4-M6 cortex): 3 Total score (0-10 with 10 being normal): 10 IMPRESSION: 1. No evidence of acute intracranial abnormality. 2. ASPECTS is 10. These results were communicated to Dr. Amada Jupiter at 3:56 pm on 07/27/2017 by text page via the [facility] messaging system. Electronically Signed   By: Sebastian Ache M.D.   On: 07/27/2017 15:57   2D Echocardiogram  - Left ventricle: The cavity size was normal. Wall thickness was increased in a pattern of mild LVH.  Systolic function was vigorous. The estimated ejection fraction was in the range of 65% to 70%. Wall motion was normal; there were no regional wall motion abnormalities. Doppler parameters are consistent with abnormal left ventricular relaxation (grade 1 diastolic dysfunction). - Aortic valve: There was mild stenosis. There  was mild regurgitation. Peak velocity (S): 245 cm/s. Mean gradient (S): 12 mm Hg. Valve area (VTI): 1.55 cm^2. Valve area (Vmax): 1.52 cm^2. Valve area (Vmean): 1.62 cm^2. - Mitral valve: Severely calcified annulus. - Tricuspid valve: There was trivial regurgitation. Impressions:  No cardiac source of emboli was indentified.  PHYSICAL EXAM General - no acute distress, well noursihed Heart - Regular rate and rhythm - no murmer appreciated Lungs - Clear to auscultation  Extremities - Distal pulses intact - no edema Skin - Warm and dry  Neurologic Examination:  Mental Status: Alert, oriented to self, but gets month/age wrong. Thought content appropriate.  Speech fluent without evidence of aphasia. Able to follow 3 step commands without difficulty. Poor recall. Cranial Nerves: II: Discs not visualized; Visual fields grossly normal, pupils equal, round, reactive to light III,IV, VI: ptosis not present, extra-ocular motions intact bilaterally V,VII: smile symmetric, facial light touch sensation normal bilaterally VIII: hearing normal bilaterally IX,X: gag reflex present XI: bilateral shoulder shrug XII: midline tongue extension Motor: RUE - 4+/5                                            LUE - 5/5   RLE - 5/5                                             LLE - 5/5 Tone and bulk:normal tone throughout; no atrophy noted Sensory: Light touch intact throughout, bilaterally Deep Tendon Reflexes: 2+ and symmetric throughout Plantars: Right: downgoing                                Left: downgoing Cerebellar: Decreased Right finger-to-nose and heel-to-shin testing in comparison to left Gait: deferred at this time.   ASSESSMENT/PLAN Erin Patterson is a 84 y.o. female with history of hypertension, hyperlipidemia, coronary artery disease with previous MI and former stroke presenting with worsening right-sided weakness.   Stroke:  bilateral small embolic infarcts secondary to unknown source,  suspicious for atrial fibrillation  Code Stroke CT head No acute stroke. ASPECTS 10.     CTA head & neck no ELVO.  Intracranial atherosclerosis moderate L M1 and mild R V4 stenosis.  Patent carotid arteries.  Aortic atherosclerosis.  MRI left lateral thalamic, tiny left posterior inferior cerebellar, right superior cerebellar and punctate right caudate white matter infarcts  2D Echo  EF 65-70%. No source of embolus   TEE recommended to look for embolic source. Discussed ordering with resident.   If TEE negative, recommend a [facility] electrophysiologist to consult and consider placement of an implantable loop recorder to evaluate for atrial fibrillation as etiology of stroke. This has been explained to patient/family. Also discussed ordering with resident.  LDL 126  HgbA1c 5.4  Lovenox 30 mg sq daily for VTE prophylaxis  No antithrombotic prior to admission, now on aspirin 81 mg daily and clopidogrel 75  mg daily. For now, will place on aspirin 81 mg and plavix 75 mg daily x 3 weeks, then aspirin alone.  If embolic source found, change to DOAC.  Therapy recommendations:  CIR  Disposition:  pending   Hypertension  Stable  Blood pressure as high as 190/94 on arrival.  Now down to 110/66 . Permissive hypertension (OK if < 220/120) but gradually normalize in 5-7 days . Long-term BP goal normotensive  Hyperlipidemia  Home meds:  lipitor 20  LDL 126, goal < 70  lipitor increased to 40 mg in hospital   Continue statin at discharge  Other Stroke Risk Factors  Advanced age  Hx stroke/TIA -reported stroke when she was in [place], etiology unknown  CAD s/p MI  Other Active Problems  Acute kidney injury with creatinine 1.56  Hospital day # 1  Annie Main, MSN, APRN, ANVP-BC, AGPCNP-BC Advanced Practice Stroke Nurse [facility] See Amion for Schedule & Pager information 07/28/2017 5:15 PM   ATTENDING NOTE: I reviewed above note  and agree with the assessment and plan. I have made any additions or clarifications directly to the above note. Pt was seen and examined.   84 year old [place] speaking female with history of hypertension, HLD, MI/CAD, history of stroke admitted for worsening right-sided weakness, and dizziness.  CT no acute abnormality.  CT head neck  unremarkable.  MRI showed Left thalamus, right caudate head, bilateral cerebellum small infarcts, cardio embolic pattern.  EF 65 to 70%.  LDL 126 and A1c 5.4.  Creatinine 1.56.  Patient stroke etiology unclear, concerning for cardioembolic infarct.  Recommend TEE and loop recorder for further cardiac embolic work-up.  Currently on aspirin 81 and Plavix 75, recommend to continue DAPT for 3 months and then Plavix alone.  Continue Lipitor 40 for stroke prevention.  PT/OT recommend CR.  Will follow.  Marvel Plan, MD PhD Stroke Neurology 07/28/2017 10:49 PM     To contact Stroke Continuity provider, please refer to [email or web address]. After hours, contact General Neurology

## 2017-07-29 LAB — BASIC METABOLIC PANEL
ANION GAP: 9 (ref 5–15)
BUN: 29 mg/dL — AB (ref 6–20)
CALCIUM: 8.9 mg/dL (ref 8.9–10.3)
CO2: 25 mmol/L (ref 22–32)
Chloride: 105 mmol/L (ref 101–111)
Creatinine, Ser: 0.81 mg/dL (ref 0.44–1.00)
GFR calc Af Amer: >60 mL/min (ref >60)
GFR calc non Af Amer: >60 mL/min (ref >60)
GLUCOSE: 97 mg/dL (ref 65–99)
Potassium: 3.9 mmol/L (ref 3.5–5.1)
Sodium: 139 mmol/L (ref 135–145)

## 2017-07-29 MED ORDER — NON FORMULARY: Freq: Every day | Status: DC

## 2017-07-29 MED ORDER — SODIUM CHLORIDE 0.9 % IV SOLN
INTRAVENOUS | Status: DC
  Administered 2017-07-29: 21:00:00 via INTRAVENOUS

## 2017-07-29 MED ORDER — MELATONIN 3 MG PO TABS
3.0000 mg | ORAL_TABLET | Freq: Every day | ORAL | Status: DC
  Administered 2017-07-29: 3 mg via ORAL
  Filled 2017-07-29 (×2): qty 1

## 2017-07-29 NOTE — Progress Notes (Signed)
Inpatient Rehabilitation Admissions Coordinator  I met with patient and her husband at bedside as she worked with Physical Therapy and using [employer] [place] [phone]. I then met with patient and her spouse to discuss a possible inpt rehab admit. They are in agreement to admit once medical workup is complete. Noted plans for TEE tomorrow at 10 am. I will follow up tomorrow for possible admit.  Danne Baxter, RN, MSN Rehab Admissions Coordinator [phone] 07/29/2017 11:42 AM

## 2017-07-29 NOTE — Progress Notes (Signed)
Physical Therapy Treatment Patient Details Name: Erin Patterson MRN: [id number] DOB: 1932-10-09 Today's Date: 07/29/2017    History of Present Illness Erin Patterson is a 84 y.o. female with a past medical history significant for hypertension, hyperlipidemia, CAD and previous MI who presented with weakness and dizziness in the setting of hypertensive urgency. Noting strength and gait assymetries on PT exam, concern for CVA with dizziness and recent hypertension, and notified MD    PT Comments    Patient required min/mod A for safety with ambulation. Gait training with and without AD this session. Pt continues to demonstrate R LE weakness and balance deficits increasing risk for falls. Husband present and supportive. Current plan remains appropriate.   Follow Up Recommendations  CIR     Equipment Recommendations  Other (comment)(TBD next venue)    Recommendations for Other Services OT consult     Precautions / Restrictions Precautions Precautions: Fall    Mobility  Bed Mobility Overal bed mobility: Needs Assistance Bed Mobility: Supine to Sit     Supine to sit: Min guard     General bed mobility comments: min guard for safety; increased time and effort  Transfers Overall transfer level: Needs assistance Equipment used: Rolling walker (2 wheeled) Transfers: Sit to/from Stand Sit to Stand: Min assist         General transfer comment: assist to steady  Ambulation/Gait Ambulation/Gait assistance: Mod assist;Min assist   Assistive device: 1 person hand held assist;Rolling walker (2 wheeled) Gait Pattern/deviations: Step-through pattern;Decreased step length - left;Decreased stride length;Decreased dorsiflexion - right;Decreased step length - right Gait velocity: decreased   General Gait Details: pt with R LE weakness that increases with increased distance; assistance required for balance and weigth shifting; cues for increased bilat step length    Stairs              Wheelchair Mobility    Modified Rankin (Stroke Patients Only)       Balance Overall balance assessment: Needs assistance Sitting-balance support: Feet supported Sitting balance-Leahy Scale: Good     Standing balance support: Bilateral upper extremity supported Standing balance-Leahy Scale: Poor                              Cognition Arousal/Alertness: Awake/alert Behavior During Therapy: WFL for tasks assessed/performed Overall Cognitive Status: Within Functional Limits for tasks assessed                                        Exercises      General Comments        Pertinent Vitals/Pain Pain Assessment: Faces Faces Pain Scale: No hurt    Home Living                      Prior Function            PT Goals (current goals can now be found in the care plan section) Progress towards PT goals: Progressing toward goals    Frequency    Min 4X/week      PT Plan Current plan remains appropriate    Co-evaluation              AM-PAC PT "6 Clicks" Daily Activity  Outcome Measure  Difficulty turning over in bed (including adjusting bedclothes, sheets and blankets)?: A Little Difficulty moving from lying  on back to sitting on the side of the bed? : A Little Difficulty sitting down on and standing up from a chair with arms (e.g., wheelchair, bedside commode, etc,.)?: Unable Help needed moving to and from a bed to chair (including a wheelchair)?: A Lot Help needed walking in hospital room?: A Lot Help needed climbing 3-5 steps with a railing? : A Lot 6 Click Score: 13    End of Session Equipment Utilized During Treatment: Gait belt Activity Tolerance: Patient tolerated treatment well Patient left: with call bell/phone within reach;in chair;with family/visitor present Nurse Communication: Mobility status PT Visit Diagnosis: Unsteadiness on feet (R26.81);Other abnormalities of gait and mobility  (R26.89);Difficulty in walking, not elsewhere classified (R26.2);Hemiplegia and hemiparesis Hemiplegia - Right/Left: Right Hemiplegia - caused by: Unspecified     Time: 0932-6712 PT Time Calculation (min) (ACUTE ONLY): 25 min  Charges:  $Gait Training: 23-37 mins                    G Codes:       Erline Levine, PTA Pager: [phone]     Carolynne Edouard 07/29/2017, 1:16 PM

## 2017-07-29 NOTE — H&P (View-Only) (Signed)
Family Medicine Teaching Service Daily Progress Note Intern Pager: [phone]  Patient name: Erin Patterson Medical record number: [id number] Date of birth: 29-Jan-1933 Age: 84 y.o. Gender: female  Primary Care Provider: Jackie Plum, MD Consultants: none Code Status: Full  Needs Vietnamese interpreter  Pt Overview and Major Events to Date:  6/1 admitted for hypertensive urgency 6/2 right sided weakness, code stroke called, CT head and CTA head and neck negative 6/3 MRI brain positive for multiple strokes, likely embolic; TTE negative for embolic source  Assessment and Plan: Erin Patterson is a 84 y.o. female with a past medical history significant for hypertension, hyperlipidemia, CAD and previous MI who presented with weakness and dizziness in the setting of hypertensive urgency, found to live embolic strokes on MRI on 6/3.  Multiple strokes, likely embolic source. Code stroke called 6/2 at 1500 d/t new-onset right sided weakness and unsteady gait.  CT head and CT angio head/neck negative; however, multiple acute strokes found on MRI brain.  TTE significant for EF 65-70%, no cardiac source of emboli identified.  CIR may accept patient once medically cleared. - permissive hypertension: BP 156/75 on 6/4 - appreciate neuro recs - Plavix 75 mg + ASA 81 mg for three weeks, then ASA alone - Lipitor 40 mg daily - PT/OT/SLP - TEE with loop recorder implanted if TEE is negative - messages left with Trish and endoscopy - LE venous dopplers to be done 6/4  CAD, h/o MI, HLD. Stable. No chest pain and EKG NSR this morning. Did have leg cramping on admission, none currently, that could be attributed to atorvastatin. LDL 127 - continue atorvastatin and ASA as above  Hypokalemia, resolved. K 3.9 on 6/4 - monitor BMP  AKI, resolved. Creatinine trending up since admission, 0.71>1.26>1.56>0.81 on 6/4.  Was possibly due to fluid loss from Lasix administration; BNP of 345 not necessarily significant  due to thin body habitus. - daily BMP - Fractional excretion of urea to determine prerenal vs. Intrinsic cause of AKI  FEN/GI: heart healthy diet PPx: lovenox  Disposition: pending continued stroke work up  Subjective:  States that she thinks her right sided weakness and numbness continue to improve.  She is tired today because she didn't sleep well overnight.  Objective: Temp:  [97.9 F (36.6 C)-98.5 F (36.9 C)] 98.2 F (36.8 C) (06/04 0430) Pulse Rate:  [63-79] 78 (06/04 0430) Resp:  [18] 18 (06/04 0430) BP: (110-156)/(60-75) 156/75 (06/04 0430) SpO2:  [97 %-99 %] 99 % (06/04 0430) Physical Exam: General: lying in bed comfortably, thin Cardiovascular: RRR, 2/6 systolic murmur Respiratory: CTAB with normal effort, no crackles Abdomen: soft, nontender, nondistended, + bowel sounds Extremities: no edema of lower extremities Neurologic: 4/5 strength bilaterally in lower and upper extremities, no appreciable difference in R vs L, no facial droop or other asymmetry, no difference in R and L sensation  Laboratory: Recent Labs  Lab 07/26/17 1522 07/27/17 0712 07/28/17 0455  WBC 4.8 7.6 6.8  HGB 13.5 13.5 13.7  HCT 42.1 41.2  39.4 40.9  PLT 133* 168 157   Recent Labs  Lab 07/26/17 1028 07/26/17 1522 07/27/17 0712 07/28/17 0455  NA 140  --  136 139  K 3.2*  --  3.6 3.6  CL 105  --  101 102  CO2 23  --  23 24  BUN 9  --  26* 44*  CREATININE 0.76 0.71 1.26* 1.56*  CALCIUM 8.9  --  9.8 10.0  GLUCOSE 106*  --  115* 114*  Lipid Panel     Component Value Date/Time   CHOL 201 (H) 07/27/2017 0712   TRIG 44 07/27/2017 0712   HDL 66 07/27/2017 0712   CHOLHDL 3.0 07/27/2017 0712   VLDL 9 07/27/2017 0712   LDLCALC 126 (H) 07/27/2017 0712    Imaging/Diagnostic Tests: Mr Brain Wo Contrast  Result Date: 07/28/2017 CLINICAL DATA:  Followup stroke. Right-sided weakness presenting yesterday. EXAM: MRI HEAD WITHOUT CONTRAST TECHNIQUE: Multiplanar, multiecho pulse  sequences of the brain and surrounding structures were obtained without intravenous contrast. COMPARISON:  CT studies done yesterday.  MRI 07/12/2015. FINDINGS: Brain: Diffusion imaging shows an acute small vessel stroke within the periphery of the inferior cerebellum on the left and superior cerebellum on the right. There is a 1 cm acute infarction in the left lateral thalamus. Punctate acute infarction in the right caudate/adjacent white matter tracks. No other acute infarction. No evidence of swelling or hemorrhage. Chronic small-vessel ischemic changes affect the pons. There are a few old small vessel cerebellar infarctions. There chronic small-vessel ischemic changes of the thalami, basal ganglia and hemispheric white matter. No large vessel territory infarction. No mass lesion, hemorrhage, hydrocephalus or extra-axial collection. Vascular: Major vessels at the base of the brain show flow. Skull and upper cervical spine: Negative Sinuses/Orbits: Clear/normal Other: None IMPRESSION: 1 cm acute infarction in the left lateral thalamus. Subcentimeter acute infarction in the posterior inferior cerebellum on the left and in the superior cerebellum on the right. Punctate acute infarction within the right caudate/adjacent white matter tracks. Anterior and posterior circulation insults suggest embolic disease from the heart or ascending aorta. Electronically Signed   By: Paulina Fusi M.D.   On: 07/28/2017 08:38    Winfrey, Harlen Labs, MD 07/29/2017, 7:17 AM PGY-1, [facility] FPTS Intern pager: [phone], text pages welcome

## 2017-07-29 NOTE — Progress Notes (Addendum)
STROKE TEAM PROGRESS NOTE   INTERVAL HISTORY Patterson at bedside. Utilized electronic interpreter at bedside until connection lost. TEE unable to be done today d/t full schedule - on for tomorrow. RN, who is vietnamese, confirmed pt understanding of plan.   Vitals:   07/28/17 2023 07/29/17 0027 07/29/17 0430 07/29/17 0834  BP: (!) 143/73 132/74 (!) 156/75 (!) 152/81  Pulse: 77 79 78 84  Resp: 18 18 18 15   Temp: 98.1 F (36.7 C) 97.9 F (36.6 C) 98.2 F (36.8 C) 98 F (36.7 C)  TempSrc: Oral Oral Oral Oral  SpO2: 97% 99% 99% 98%  Weight:      Height:        CBC:  Recent Labs  Lab 07/27/17 0712 07/28/17 0455  WBC 7.6 6.8  HGB 13.5 13.7  HCT 41.2  39.4 40.9  MCV 77.4* 76.7*  PLT 168 157    Basic Metabolic Panel:  Recent Labs  Lab 07/26/17 1522  07/28/17 0455 07/29/17 0729  NA  --    < > 139 139  K  --    < > 3.6 3.9  CL  --    < > 102 105  CO2  --    < > 24 25  GLUCOSE  --    < > 114* 97  BUN  --    < > 44* 29*  CREATININE 0.71   < > 1.56* 0.81  CALCIUM  --    < > 10.0 8.9  MG 1.7  --   --   --   PHOS 2.4*  --   --   --    < > = values in this interval not displayed.   Lipid Panel:     Component Value Date/Time   CHOL 201 (H) 07/27/2017 0712   TRIG 44 07/27/2017 0712   HDL 66 07/27/2017 0712   CHOLHDL 3.0 07/27/2017 0712   VLDL 9 07/27/2017 0712   LDLCALC 126 (H) 07/27/2017 0712   HgbA1c:  Lab Results  Component Value Date   HGBA1C 5.4 07/28/2017   Urine Drug Screen:     Component Value Date/Time   LABOPIA NONE DETECTED 07/28/2017 0643   COCAINSCRNUR NONE DETECTED 07/28/2017 0643   LABBENZ NONE DETECTED 07/28/2017 0643   AMPHETMU NONE DETECTED 07/28/2017 0643   THCU NONE DETECTED 07/28/2017 0643   LABBARB NONE DETECTED 07/28/2017 0643    Alcohol Level No results found for: ETH  IMAGING Ct Angio Head W Or Wo Contrast  Result Date: 07/27/2017 CLINICAL DATA:  Right-sided weakness. EXAM: CT ANGIOGRAPHY HEAD AND NECK TECHNIQUE: Multidetector CT  imaging of the head and neck was performed using the standard protocol during bolus administration of intravenous contrast. Multiplanar CT image reconstructions and MIPs were obtained to evaluate the vascular anatomy. Carotid stenosis measurements (when applicable) are obtained utilizing NASCET criteria, using the distal internal carotid diameter as the denominator. CONTRAST:  50mL ISOVUE-370 IOPAMIDOL (ISOVUE-370) INJECTION 76% COMPARISON:  None. FINDINGS: CTA NECK FINDINGS Aortic arch: Standard 3 vessel aortic arch with moderate diffuse atherosclerotic plaque. No significant arch vessel origin stenosis. Slightly ectatic appearance of the distal ascending aorta, 3.6 cm diameter. Right carotid system: Patent without evidence of stenosis or dissection. Minimal atherosclerotic plaque at the carotid bifurcation. Left carotid system: Patent without evidence of stenosis or dissection. Minimal atherosclerotic plaque at the carotid bifurcation. Vertebral arteries: Patent and codominant without evidence of significant stenosis or dissection. Skeleton: Mild lower cervical disc degeneration. Other neck: No mass or lymph node  enlargement. Upper chest: Clear lung apices. Review of the MIP images confirms the above findings CTA HEAD FINDINGS Anterior circulation: The internal carotid arteries are patent from skull base to carotid termini with mild-to-moderate nonstenotic siphon atherosclerosis bilaterally. ACAs and MCAs are patent without evidence of proximal branch occlusion. There is a moderate left M1 stenosis. The right A1 segment is dominant. No aneurysm. Posterior circulation: The intracranial vertebral arteries are patent to the basilar. Right V4 segment calcified atherosclerosis results in mild stenosis. Patent left PICA, right AICA, and bilateral SCA origins are visualized. The basilar artery is patent with mild atherosclerotic type irregularity but no significant stenosis. Posterior communicating arteries are not  identified. PCAs are patent without evidence of significant proximal stenosis. No aneurysm. Venous sinuses: Small left sigmoid sinus, poorly visualized and with evidence of slow flow on a 2017 brain MRI. Major dural venous sinuses are otherwise grossly patent allowing for arterial phase dominant contrast timing. Anatomic variants: Hypoplastic left A1. Delayed phase: Not performed. Review of the MIP images confirms the above findings IMPRESSION: 1. No emergent large vessel occlusion. 2. Intracranial atherosclerosis resulting in moderate left M1 and mild right V4 stenoses. 3. Widely patent carotid arteries. 4.  Aortic Atherosclerosis (ICD10-I70.0). These results were communicated to Dr. Amada Jupiter at 4:24 pm on 07/27/2017 by text page via the [facility] messaging system. Electronically Signed   By: Sebastian Ache M.D.   On: 07/27/2017 16:37   Ct Angio Neck W Or Wo Contrast  Result Date: 07/27/2017 CLINICAL DATA:  Right-sided weakness. EXAM: CT ANGIOGRAPHY HEAD AND NECK TECHNIQUE: Multidetector CT imaging of the head and neck was performed using the standard protocol during bolus administration of intravenous contrast. Multiplanar CT image reconstructions and MIPs were obtained to evaluate the vascular anatomy. Carotid stenosis measurements (when applicable) are obtained utilizing NASCET criteria, using the distal internal carotid diameter as the denominator. CONTRAST:  50mL ISOVUE-370 IOPAMIDOL (ISOVUE-370) INJECTION 76% COMPARISON:  None. FINDINGS: CTA NECK FINDINGS Aortic arch: Standard 3 vessel aortic arch with moderate diffuse atherosclerotic plaque. No significant arch vessel origin stenosis. Slightly ectatic appearance of the distal ascending aorta, 3.6 cm diameter. Right carotid system: Patent without evidence of stenosis or dissection. Minimal atherosclerotic plaque at the carotid bifurcation. Left carotid system: Patent without evidence of stenosis or dissection. Minimal atherosclerotic plaque at the carotid  bifurcation. Vertebral arteries: Patent and codominant without evidence of significant stenosis or dissection. Skeleton: Mild lower cervical disc degeneration. Other neck: No mass or lymph node enlargement. Upper chest: Clear lung apices. Review of the MIP images confirms the above findings CTA HEAD FINDINGS Anterior circulation: The internal carotid arteries are patent from skull base to carotid termini with mild-to-moderate nonstenotic siphon atherosclerosis bilaterally. ACAs and MCAs are patent without evidence of proximal branch occlusion. There is a moderate left M1 stenosis. The right A1 segment is dominant. No aneurysm. Posterior circulation: The intracranial vertebral arteries are patent to the basilar. Right V4 segment calcified atherosclerosis results in mild stenosis. Patent left PICA, right AICA, and bilateral SCA origins are visualized. The basilar artery is patent with mild atherosclerotic type irregularity but no significant stenosis. Posterior communicating arteries are not identified. PCAs are patent without evidence of significant proximal stenosis. No aneurysm. Venous sinuses: Small left sigmoid sinus, poorly visualized and with evidence of slow flow on a 2017 brain MRI. Major dural venous sinuses are otherwise grossly patent allowing for arterial phase dominant contrast timing. Anatomic variants: Hypoplastic left A1. Delayed phase: Not performed. Review of the MIP images confirms the  above findings IMPRESSION: 1. No emergent large vessel occlusion. 2. Intracranial atherosclerosis resulting in moderate left M1 and mild right V4 stenoses. 3. Widely patent carotid arteries. 4.  Aortic Atherosclerosis (ICD10-I70.0). These results were communicated to Dr. Amada Jupiter at 4:24 pm on 07/27/2017 by text page via the [facility] messaging system. Electronically Signed   By: Sebastian Ache M.D.   On: 07/27/2017 16:37   Mr Brain Wo Contrast  Result Date: 07/28/2017 CLINICAL DATA:  Followup stroke. Right-sided  weakness presenting yesterday. EXAM: MRI HEAD WITHOUT CONTRAST TECHNIQUE: Multiplanar, multiecho pulse sequences of the brain and surrounding structures were obtained without intravenous contrast. COMPARISON:  CT studies done yesterday.  MRI 07/12/2015. FINDINGS: Brain: Diffusion imaging shows an acute small vessel stroke within the periphery of the inferior cerebellum on the left and superior cerebellum on the right. There is a 1 cm acute infarction in the left lateral thalamus. Punctate acute infarction in the right caudate/adjacent white matter tracks. No other acute infarction. No evidence of swelling or hemorrhage. Chronic small-vessel ischemic changes affect the pons. There are a few old small vessel cerebellar infarctions. There chronic small-vessel ischemic changes of the thalami, basal ganglia and hemispheric white matter. No large vessel territory infarction. No mass lesion, hemorrhage, hydrocephalus or extra-axial collection. Vascular: Major vessels at the base of the brain show flow. Skull and upper cervical spine: Negative Sinuses/Orbits: Clear/normal Other: None IMPRESSION: 1 cm acute infarction in the left lateral thalamus. Subcentimeter acute infarction in the posterior inferior cerebellum on the left and in the superior cerebellum on the right. Punctate acute infarction within the right caudate/adjacent white matter tracks. Anterior and posterior circulation insults suggest embolic disease from the heart or ascending aorta. Electronically Signed   By: Paulina Fusi M.D.   On: 07/28/2017 08:38   Ct Head Code Stroke Wo Contrast  Result Date: 07/27/2017 CLINICAL DATA:  Code stroke.  Right-sided weakness. EXAM: CT HEAD WITHOUT CONTRAST TECHNIQUE: Contiguous axial images were obtained from the base of the skull through the vertex without intravenous contrast. COMPARISON:  Brain MRI 07/12/2015 FINDINGS: Brain: There is no evidence of acute large territory infarct, intracranial hemorrhage, mass, midline  shift, or extra-axial fluid collection. There are small chronic infarcts in the cerebellum bilaterally including a new 3 mm infarct on the left. A dilated perivascular space is again noted inferiorly in the right basal ganglia. A subcentimeter low-density focus in the left lentiform nucleus is unchanged from the prior MRI and was without significant surrounding gliosis on that examination suggesting that this may also represent a dilated perivascular space rather than a chronic lacunar infarct. Generalized cerebral atrophy is mild for age. Vascular: Calcified atherosclerosis at the skull base. No hyperdense vessel. Skull: No fracture or destructive osseous lesion. Sinuses/Orbits: Visualized paranasal sinuses and mastoid air cells are clear. Prior right cataract extraction is noted. Other: None. ASPECTS [facility] Stroke Program Early CT Score) - Ganglionic level infarction (caudate, lentiform nuclei, internal capsule, insula, M1-M3 cortex): 7 - Supraganglionic infarction (M4-M6 cortex): 3 Total score (0-10 with 10 being normal): 10 IMPRESSION: 1. No evidence of acute intracranial abnormality. 2. ASPECTS is 10. These results were communicated to Dr. Amada Jupiter at 3:56 pm on 07/27/2017 by text page via the [facility] messaging system. Electronically Signed   By: Sebastian Ache M.D.   On: 07/27/2017 15:57   2D Echocardiogram  - Left ventricle: The cavity size was normal. Wall thickness was increased in a pattern of mild LVH. Systolic function was vigorous. The estimated ejection fraction was in  the range of 65% to 70%. Wall motion was normal; there were no regional wall motion abnormalities. Doppler parameters are consistent with abnormal left ventricular relaxation (grade 1 diastolic dysfunction). - Aortic valve: There was mild stenosis. There was mild regurgitation. Peak velocity (S): 245 cm/s. Mean gradient (S): 12 mm Hg. Valve area (VTI): 1.55 cm^2. Valve area (Vmax): 1.52 cm^2. Valve area (Vmean): 1.62 cm^2. - Mitral  valve: Severely calcified annulus. - Tricuspid valve: There was trivial regurgitation. Impressions:  No cardiac source of emboli was indentified.  LE doppler pending   TEE pending    PHYSICAL EXAM General - no acute distress, well noursihed Heart - Regular rate and rhythm - no murmer appreciated Lungs - Clear to auscultation  Extremities - Distal pulses intact - no edema Skin - Warm and dry  Neurologic Examination:  Mental Status: Alert, oriented to self, but gets month/age wrong. Thought content appropriate.  Speech fluent without evidence of aphasia. Able to follow 3 step commands without difficulty. Poor recall. Cranial Nerves: II: Discs not visualized; Visual fields grossly normal, pupils equal, round, reactive to light III,IV, VI: ptosis not present, extra-ocular motions intact bilaterally V,VII: smile symmetric, facial light touch sensation normal bilaterally VIII: hearing normal bilaterally IX,X: gag reflex present XI: bilateral shoulder shrug XII: midline tongue extension Motor: RUE - 4+/5                                            LUE - 5/5   RLE - 5/5                                             LLE - 5/5 Tone and bulk:normal tone throughout; no atrophy noted Sensory: Light touch intact throughout, bilaterally Deep Tendon Reflexes: 2+ and symmetric throughout Plantars: Right: downgoing                                Left: downgoing Cerebellar: No ataxia noted  Gait: deferred at this time.   ASSESSMENT/PLAN [name] is a 84 y.o. female with history of hypertension, hyperlipidemia, coronary artery disease with previous MI and former stroke presenting with worsening right-sided weakness.   Stroke:  bilateral small embolic infarcts secondary to unknown source, suspicious for atrial fibrillation  Code Stroke CT head No acute stroke. ASPECTS 10.     CTA head & neck no ELVO.  Intracranial atherosclerosis moderate L M1 and mild R V4 stenosis.  Patent  carotid arteries.  Aortic atherosclerosis.  MRI left lateral thalamic, tiny left posterior inferior cerebellar, right superior cerebellar and punctate right caudate white matter infarcts  2D Echo  EF 65-70%. No source of embolus   TEE for 07/30/17 at 1000   LE doppler pending   implantable loop recorder pending if TEE and LE doppler negative  LDL 126  HgbA1c 5.4  Lovenox 30 mg sq daily for VTE prophylaxis  No antithrombotic prior to admission, now on aspirin 81 mg daily and clopidogrel 75 mg daily. Plan aspirin 81 mg and plavix 75 mg daily x 3 weeks, then aspirin alone.  If embolic source found, change to DOAC.  Therapy recommendations:  CIR  Disposition:  pending   Hypertension  Stable  Blood pressure as high as 190/94 on arrival.  Now down to 110-150s . Permissive hypertension (OK if < 220/120) but gradually normalize in 5-7 days . Long-term BP goal normotensive  Hyperlipidemia  Home meds:  lipitor 20  LDL 126, goal < 70  lipitor increased to 40 mg in hospital   Continue statin at discharge  Other Stroke Risk Factors  Advanced age  Hx stroke/TIA -reported stroke when she was in [place], etiology unknown  CAD s/p MI  Other Active Problems  Acute kidney injury resolved, Cr 1.56->0.81  Hypokalemia, resolved  Hospital day # 2  Annie Main, MSN, APRN, ANVP-BC, AGPCNP-BC Advanced Practice Stroke Nurse [facility] See Amion for Schedule & Pager information 07/29/2017 8:53 AM   ATTENDING NOTE: I reviewed above note and agree with the assessment and plan. I have made any additions or clarifications directly to the above note. Pt was seen and examined.   Patient neuro stable, no acute event overnight.  Right arm and leg strengths improved.  LE venous Doppler pending. TEE and loop recorder pending tomorrow.  Continued on aspirin 81 and Plavix 75 as well as Lipitor 40.  Will follow.  Marvel Plan, MD PhD Stroke Neurology 07/29/2017 4:35 PM   To  contact Stroke Continuity provider, please refer to [email or web address]. After hours, contact General Neurology

## 2017-07-29 NOTE — Progress Notes (Addendum)
Family Medicine Teaching Service Daily Progress Note Intern Pager: [phone]  Patient name: Erin Patterson Medical record number: [id number] Date of birth: 29-Jan-1933 Age: 84 y.o. Gender: female  Primary Care Provider: Jackie Plum, MD Consultants: none Code Status: Full  Needs Vietnamese interpreter  Pt Overview and Major Events to Date:  6/1 admitted for hypertensive urgency 6/2 right sided weakness, code stroke called, CT head and CTA head and neck negative 6/3 MRI brain positive for multiple strokes, likely embolic; TTE negative for embolic source  Assessment and Plan: Erin Patterson is a 84 y.o. female with a past medical history significant for hypertension, hyperlipidemia, CAD and previous MI who presented with weakness and dizziness in the setting of hypertensive urgency, found to live embolic strokes on MRI on 6/3.  Multiple strokes, likely embolic source. Code stroke called 6/2 at 1500 d/t new-onset right sided weakness and unsteady gait.  CT head and CT angio head/neck negative; however, multiple acute strokes found on MRI brain.  TTE significant for EF 65-70%, no cardiac source of emboli identified.  CIR may accept patient once medically cleared. - permissive hypertension: BP 156/75 on 6/4 - appreciate neuro recs - Plavix 75 mg + ASA 81 mg for three weeks, then ASA alone - Lipitor 40 mg daily - PT/OT/SLP - TEE with loop recorder implanted if TEE is negative - messages left with Trish and endoscopy - LE venous dopplers to be done 6/4  CAD, h/o MI, HLD. Stable. No chest pain and EKG NSR this morning. Did have leg cramping on admission, none currently, that could be attributed to atorvastatin. LDL 127 - continue atorvastatin and ASA as above  Hypokalemia, resolved. K 3.9 on 6/4 - monitor BMP  AKI, resolved. Creatinine trending up since admission, 0.71>1.26>1.56>0.81 on 6/4.  Was possibly due to fluid loss from Lasix administration; BNP of 345 not necessarily significant  due to thin body habitus. - daily BMP - Fractional excretion of urea to determine prerenal vs. Intrinsic cause of AKI  FEN/GI: heart healthy diet PPx: lovenox  Disposition: pending continued stroke work up  Subjective:  States that she thinks her right sided weakness and numbness continue to improve.  She is tired today because she didn't sleep well overnight.  Objective: Temp:  [97.9 F (36.6 C)-98.5 F (36.9 C)] 98.2 F (36.8 C) (06/04 0430) Pulse Rate:  [63-79] 78 (06/04 0430) Resp:  [18] 18 (06/04 0430) BP: (110-156)/(60-75) 156/75 (06/04 0430) SpO2:  [97 %-99 %] 99 % (06/04 0430) Physical Exam: General: lying in bed comfortably, thin Cardiovascular: RRR, 2/6 systolic murmur Respiratory: CTAB with normal effort, no crackles Abdomen: soft, nontender, nondistended, + bowel sounds Extremities: no edema of lower extremities Neurologic: 4/5 strength bilaterally in lower and upper extremities, no appreciable difference in R vs L, no facial droop or other asymmetry, no difference in R and L sensation  Laboratory: Recent Labs  Lab 07/26/17 1522 07/27/17 0712 07/28/17 0455  WBC 4.8 7.6 6.8  HGB 13.5 13.5 13.7  HCT 42.1 41.2  39.4 40.9  PLT 133* 168 157   Recent Labs  Lab 07/26/17 1028 07/26/17 1522 07/27/17 0712 07/28/17 0455  NA 140  --  136 139  K 3.2*  --  3.6 3.6  CL 105  --  101 102  CO2 23  --  23 24  BUN 9  --  26* 44*  CREATININE 0.76 0.71 1.26* 1.56*  CALCIUM 8.9  --  9.8 10.0  GLUCOSE 106*  --  115* 114*  Lipid Panel     Component Value Date/Time   CHOL 201 (H) 07/27/2017 0712   TRIG 44 07/27/2017 0712   HDL 66 07/27/2017 0712   CHOLHDL 3.0 07/27/2017 0712   VLDL 9 07/27/2017 0712   LDLCALC 126 (H) 07/27/2017 0712    Imaging/Diagnostic Tests: Mr Brain Wo Contrast  Result Date: 07/28/2017 CLINICAL DATA:  Followup stroke. Right-sided weakness presenting yesterday. EXAM: MRI HEAD WITHOUT CONTRAST TECHNIQUE: Multiplanar, multiecho pulse  sequences of the brain and surrounding structures were obtained without intravenous contrast. COMPARISON:  CT studies done yesterday.  MRI 07/12/2015. FINDINGS: Brain: Diffusion imaging shows an acute small vessel stroke within the periphery of the inferior cerebellum on the left and superior cerebellum on the right. There is a 1 cm acute infarction in the left lateral thalamus. Punctate acute infarction in the right caudate/adjacent white matter tracks. No other acute infarction. No evidence of swelling or hemorrhage. Chronic small-vessel ischemic changes affect the pons. There are a few old small vessel cerebellar infarctions. There chronic small-vessel ischemic changes of the thalami, basal ganglia and hemispheric white matter. No large vessel territory infarction. No mass lesion, hemorrhage, hydrocephalus or extra-axial collection. Vascular: Major vessels at the base of the brain show flow. Skull and upper cervical spine: Negative Sinuses/Orbits: Clear/normal Other: None IMPRESSION: 1 cm acute infarction in the left lateral thalamus. Subcentimeter acute infarction in the posterior inferior cerebellum on the left and in the superior cerebellum on the right. Punctate acute infarction within the right caudate/adjacent white matter tracks. Anterior and posterior circulation insults suggest embolic disease from the heart or ascending aorta. Electronically Signed   By: Paulina Fusi M.D.   On: 07/28/2017 08:38    Lamarr Feenstra, Harlen Labs, MD 07/29/2017, 7:17 AM PGY-1, [employer] Family Medicine FPTS Intern pager: [phone], text pages welcome

## 2017-07-29 NOTE — Progress Notes (Signed)
24 Hours urine collected and sent to lab at this time.    Sim Boast, RN

## 2017-07-29 NOTE — Progress Notes (Signed)
    [facility] has been requested to perform a transesophageal echocardiogram on 07/30/17 for stroke evaluation.  After careful review of history and examination, the risks and benefits of transesophageal echocardiogram have been explained including risks of esophageal damage, perforation (1:10,000 risk), bleeding, pharyngeal hematoma as well as other potential complications associated with conscious sedation including aspiration, arrhythmia, respiratory failure and death. Alternatives to treatment were discussed, questions were answered. Patient is willing to proceed.   TEE scheduled for 07/30/17 at 10:00am with Dr. Inocencio Homes, PA-C 07/29/2017 11:27 AM

## 2017-07-30 ENCOUNTER — Inpatient Hospital Stay (HOSPITAL_COMMUNITY): Payer: Medicaid Other

## 2017-07-30 ENCOUNTER — Encounter (HOSPITAL_COMMUNITY): Payer: Self-pay

## 2017-07-30 ENCOUNTER — Encounter (HOSPITAL_COMMUNITY): Payer: Self-pay | Admitting: Physical Medicine and Rehabilitation

## 2017-07-30 ENCOUNTER — Inpatient Hospital Stay (HOSPITAL_COMMUNITY)
Admission: RE | Admit: 2017-07-30 | Discharge: 2017-08-08 | DRG: 057 | Disposition: A | Discharge status: Home Health | Payer: Medicaid Other | Source: Intra-hospital | Attending: Physical Medicine & Rehabilitation | Admitting: Physical Medicine & Rehabilitation

## 2017-07-30 ENCOUNTER — Encounter (HOSPITAL_COMMUNITY)
Admission: EM | Disposition: A | Discharge status: Rehabilitation Facility | Payer: Self-pay | Source: Home / Self Care | Attending: Family Medicine

## 2017-07-30 ENCOUNTER — Other Ambulatory Visit: Payer: Self-pay

## 2017-07-30 ENCOUNTER — Other Ambulatory Visit: Payer: Self-pay | Admitting: Nurse Practitioner

## 2017-07-30 DIAGNOSIS — G479 Sleep disorder, unspecified: Secondary | ICD-10-CM | POA: Diagnosis present

## 2017-07-30 DIAGNOSIS — E785 Hyperlipidemia, unspecified: Secondary | ICD-10-CM | POA: Diagnosis present

## 2017-07-30 DIAGNOSIS — I2510 Atherosclerotic heart disease of native coronary artery without angina pectoris: Secondary | ICD-10-CM | POA: Diagnosis present

## 2017-07-30 DIAGNOSIS — I252 Old myocardial infarction: Secondary | ICD-10-CM

## 2017-07-30 DIAGNOSIS — R945 Abnormal results of liver function studies: Secondary | ICD-10-CM | POA: Diagnosis present

## 2017-07-30 DIAGNOSIS — E46 Unspecified protein-calorie malnutrition: Secondary | ICD-10-CM | POA: Diagnosis present

## 2017-07-30 DIAGNOSIS — Z6820 Body mass index (BMI) 20.0-20.9, adult: Secondary | ICD-10-CM | POA: Diagnosis not present

## 2017-07-30 DIAGNOSIS — I10 Essential (primary) hypertension: Secondary | ICD-10-CM | POA: Diagnosis present

## 2017-07-30 DIAGNOSIS — D696 Thrombocytopenia, unspecified: Secondary | ICD-10-CM | POA: Diagnosis present

## 2017-07-30 DIAGNOSIS — Z791 Long term (current) use of non-steroidal anti-inflammatories (NSAID): Secondary | ICD-10-CM

## 2017-07-30 DIAGNOSIS — I69319 Unspecified symptoms and signs involving cognitive functions following cerebral infarction: Secondary | ICD-10-CM

## 2017-07-30 DIAGNOSIS — I6349 Cerebral infarction due to embolism of other cerebral artery: Secondary | ICD-10-CM

## 2017-07-30 DIAGNOSIS — I639 Cerebral infarction, unspecified: Secondary | ICD-10-CM | POA: Diagnosis present

## 2017-07-30 DIAGNOSIS — R740 Nonspecific elevation of levels of transaminase and lactic acid dehydrogenase [LDH]: Secondary | ICD-10-CM | POA: Diagnosis not present

## 2017-07-30 DIAGNOSIS — I63432 Cerebral infarction due to embolism of left posterior cerebral artery: Secondary | ICD-10-CM | POA: Diagnosis not present

## 2017-07-30 DIAGNOSIS — R42 Dizziness and giddiness: Secondary | ICD-10-CM

## 2017-07-30 DIAGNOSIS — E7800 Pure hypercholesterolemia, unspecified: Secondary | ICD-10-CM | POA: Diagnosis present

## 2017-07-30 DIAGNOSIS — E860 Dehydration: Secondary | ICD-10-CM | POA: Diagnosis present

## 2017-07-30 DIAGNOSIS — R7989 Other specified abnormal findings of blood chemistry: Secondary | ICD-10-CM

## 2017-07-30 DIAGNOSIS — I351 Nonrheumatic aortic (valve) insufficiency: Secondary | ICD-10-CM

## 2017-07-30 DIAGNOSIS — Z8673 Personal history of transient ischemic attack (TIA), and cerebral infarction without residual deficits: Secondary | ICD-10-CM

## 2017-07-30 DIAGNOSIS — I340 Nonrheumatic mitral (valve) insufficiency: Secondary | ICD-10-CM

## 2017-07-30 DIAGNOSIS — I6340 Cerebral infarction due to embolism of unspecified cerebral artery: Principal | ICD-10-CM

## 2017-07-30 DIAGNOSIS — G8191 Hemiplegia, unspecified affecting right dominant side: Secondary | ICD-10-CM | POA: Diagnosis not present

## 2017-07-30 DIAGNOSIS — I679 Cerebrovascular disease, unspecified: Secondary | ICD-10-CM

## 2017-07-30 DIAGNOSIS — I69351 Hemiplegia and hemiparesis following cerebral infarction affecting right dominant side: Principal | ICD-10-CM

## 2017-07-30 DIAGNOSIS — I69398 Other sequelae of cerebral infarction: Secondary | ICD-10-CM | POA: Diagnosis not present

## 2017-07-30 HISTORY — PX: TEE WITHOUT CARDIOVERSION: SHX5443

## 2017-07-30 LAB — BASIC METABOLIC PANEL
Anion gap: 10 (ref 5–15)
BUN: 26 mg/dL — ABNORMAL HIGH (ref 6–20)
CO2: 24 mmol/L (ref 22–32)
CREATININE: 0.74 mg/dL (ref 0.44–1.00)
Calcium: 8.9 mg/dL (ref 8.9–10.3)
Chloride: 107 mmol/L (ref 101–111)
Glucose, Bld: 93 mg/dL (ref 65–99)
Potassium: 4.6 mmol/L (ref 3.5–5.1)
SODIUM: 141 mmol/L (ref 135–145)

## 2017-07-30 LAB — CBC
HCT: 39.4 % (ref 36.0–46.0)
Hemoglobin: 13 g/dL (ref 12.0–15.0)
MCH: 25.8 pg — ABNORMAL LOW (ref 26.0–34.0)
MCHC: 33 g/dL (ref 30.0–36.0)
MCV: 78.2 fL (ref 78.0–100.0)
PLATELETS: 131 10*3/uL — AB (ref 150–400)
RBC: 5.04 MIL/uL (ref 3.87–5.11)
RDW: 15.9 % — ABNORMAL HIGH (ref 11.5–15.5)
WBC: 5.6 10*3/uL (ref 4.0–10.5)

## 2017-07-30 SURGERY — ECHOCARDIOGRAM, TRANSESOPHAGEAL
Anesthesia: Moderate Sedation

## 2017-07-30 MED ORDER — ASPIRIN EC 81 MG PO TBEC
81.0000 mg | DELAYED_RELEASE_TABLET | Freq: Every day | ORAL | Status: DC
  Administered 2017-07-31 – 2017-08-08 (×9): 81 mg via ORAL
  Filled 2017-07-30 (×9): qty 1

## 2017-07-30 MED ORDER — CLOPIDOGREL BISULFATE 75 MG PO TABS: 75.0000 mg | ORAL_TABLET | Freq: Every day | ORAL | Status: DC

## 2017-07-30 MED ORDER — ATORVASTATIN CALCIUM 40 MG PO TABS: 40.0000 mg | ORAL_TABLET | Freq: Every day | ORAL | Status: DC

## 2017-07-30 MED ORDER — ACETAMINOPHEN 325 MG PO TABS: 650.0000 mg | ORAL_TABLET | Freq: Four times a day (QID) | ORAL | 0 refills | Status: DC | PRN

## 2017-07-30 MED ORDER — MIDAZOLAM HCL 5 MG/5ML IJ SOLN
INTRAMUSCULAR | Status: DC | PRN
  Administered 2017-07-30: 1 mg via INTRAVENOUS
  Administered 2017-07-30: 0.5 mg via INTRAVENOUS
  Administered 2017-07-30: 1 mg via INTRAVENOUS

## 2017-07-30 MED ORDER — BUTAMBEN-TETRACAINE-BENZOCAINE 2-2-14 % EX AERO
INHALATION_SPRAY | CUTANEOUS | Status: DC | PRN
  Administered 2017-07-30: 2 via TOPICAL

## 2017-07-30 MED ORDER — MELATONIN 3 MG PO TABS: 3.0000 mg | ORAL_TABLET | Freq: Every day | ORAL | 0 refills | Status: DC

## 2017-07-30 MED ORDER — ENOXAPARIN SODIUM 40 MG/0.4ML ~~LOC~~ SOLN
40.0000 mg | SUBCUTANEOUS | Status: DC
  Administered 2017-07-30 – 2017-08-07 (×9): 40 mg via SUBCUTANEOUS
  Filled 2017-07-30 (×9): qty 0.4

## 2017-07-30 MED ORDER — CLOPIDOGREL BISULFATE 75 MG PO TABS
75.0000 mg | ORAL_TABLET | Freq: Every day | ORAL | Status: DC
  Administered 2017-07-31 – 2017-08-08 (×9): 75 mg via ORAL
  Filled 2017-07-30 (×9): qty 1

## 2017-07-30 MED ORDER — ALUM & MAG HYDROXIDE-SIMETH 200-200-20 MG/5ML PO SUSP: 30.0000 mL | ORAL | Status: DC | PRN

## 2017-07-30 MED ORDER — ACETAMINOPHEN 325 MG PO TABS
325.0000 mg | ORAL_TABLET | ORAL | Status: DC | PRN
Start: 2017-07-30 — End: 2017-08-08
  Administered 2017-08-02 – 2017-08-05 (×3): 650 mg via ORAL
  Filled 2017-07-30 (×3): qty 2

## 2017-07-30 MED ORDER — POLYETHYLENE GLYCOL 3350 17 G PO PACK
17.0000 g | PACK | Freq: Every day | ORAL | Status: DC | PRN
  Administered 2017-08-01: 17 g via ORAL
  Filled 2017-07-30 (×2): qty 1

## 2017-07-30 MED ORDER — MELATONIN 3 MG PO TABS
3.0000 mg | ORAL_TABLET | Freq: Every day | ORAL | Status: DC
  Administered 2017-07-30 – 2017-08-07 (×9): 3 mg via ORAL
  Filled 2017-07-30 (×9): qty 1

## 2017-07-30 MED ORDER — BISACODYL 10 MG RE SUPP: 10.0000 mg | Freq: Every day | RECTAL | Status: DC | PRN

## 2017-07-30 MED ORDER — FENTANYL CITRATE (PF) 100 MCG/2ML IJ SOLN
INTRAMUSCULAR | Status: DC | PRN
  Administered 2017-07-30: 12.5 ug via INTRAVENOUS
  Administered 2017-07-30: 25 ug via INTRAVENOUS

## 2017-07-30 MED ORDER — FENTANYL CITRATE (PF) 100 MCG/2ML IJ SOLN
INTRAMUSCULAR | Status: AC
  Filled 2017-07-30: qty 2

## 2017-07-30 MED ORDER — PROCHLORPERAZINE 25 MG RE SUPP: 12.5000 mg | Freq: Four times a day (QID) | RECTAL | Status: DC | PRN

## 2017-07-30 MED ORDER — FLEET ENEMA 7-19 GM/118ML RE ENEM: 1.0000 | ENEMA | Freq: Once | RECTAL | Status: DC | PRN

## 2017-07-30 MED ORDER — MIDAZOLAM HCL 5 MG/ML IJ SOLN
INTRAMUSCULAR | Status: AC
  Filled 2017-07-30: qty 2

## 2017-07-30 MED ORDER — ENOXAPARIN SODIUM 30 MG/0.3ML ~~LOC~~ SOLN
30.0000 mg | SUBCUTANEOUS | Status: DC
Start: 2017-07-30 — End: 2017-08-08

## 2017-07-30 MED ORDER — ENOXAPARIN SODIUM 30 MG/0.3ML ~~LOC~~ SOLN: 30.0000 mg | SUBCUTANEOUS | Status: DC

## 2017-07-30 MED ORDER — GUAIFENESIN-DM 100-10 MG/5ML PO SYRP: 5.0000 mL | ORAL_SOLUTION | Freq: Four times a day (QID) | ORAL | Status: DC | PRN

## 2017-07-30 MED ORDER — PROCHLORPERAZINE MALEATE 5 MG PO TABS: 5.0000 mg | ORAL_TABLET | Freq: Four times a day (QID) | ORAL | Status: DC | PRN

## 2017-07-30 MED ORDER — TRAZODONE HCL 50 MG PO TABS: 25.0000 mg | ORAL_TABLET | Freq: Every evening | ORAL | Status: DC | PRN

## 2017-07-30 MED ORDER — ASPIRIN 81 MG PO TBEC: 81.0000 mg | DELAYED_RELEASE_TABLET | Freq: Every day | ORAL | 0 refills | Status: DC

## 2017-07-30 MED ORDER — PROCHLORPERAZINE EDISYLATE 10 MG/2ML IJ SOLN: 5.0000 mg | Freq: Four times a day (QID) | INTRAMUSCULAR | Status: DC | PRN

## 2017-07-30 MED ORDER — DIPHENHYDRAMINE HCL 12.5 MG/5ML PO ELIX: 12.5000 mg | ORAL_SOLUTION | Freq: Four times a day (QID) | ORAL | Status: DC | PRN

## 2017-07-30 NOTE — Progress Notes (Signed)
Patient ID: Erin Patterson, female   DOB: [birth year], 84 y.o.   MRN: [id number] Pt admitted to Unit speaks only [place] VSS appears comfortable. Skin intact, heels with slight redness and minor boggy feeling to touch. Pt has 3 SR up and call light in reach.
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
## 2017-07-30 NOTE — Care Management Note (Signed)
Case Management Note  Patient Details  Name: Erin Patterson MRN: [id number] Date of Birth: 08/04/1932  Subjective/Objective:      Pt admitted with CVA. She is from home with spouse.              Action/Plan: Pt discharging to CIR today. CM signing off.    Expected Discharge Date:                  Expected Discharge Plan:  IP Rehab Facility  In-House Referral:     Discharge planning Services  CM Consult  Post Acute Care Choice:    Choice offered to:     DME Arranged:    DME Agency:     HH Arranged:    HH Agency:     Status of Service:  Completed, signed off  If discussed at [employer] of Stay Meetings, dates discussed:    Additional Comments:  Kermit Balo, RN 07/30/2017, 12:51 PM

## 2017-07-30 NOTE — Progress Notes (Signed)
Patient left unit for TEE at this time.    Montina Dorrance, RN 

## 2017-07-30 NOTE — Consult Note (Signed)
ELECTROPHYSIOLOGY CONSULT NOTE  Patient ID: Erin Patterson MRN: [id number], DOB/AGE: Jun 03, 1932   Admit date: 07/26/2017 Date of Consult: 07/30/2017  Primary Physician: Jackie Plum, MD Primary Cardiologist: new to [facility] Reason for Consultation: Cryptogenic stroke; recommendations regarding Implantable Loop Recorder  History of Present Illness EP has been asked to evaluate Erin Patterson for placement of an implantable loop recorder to monitor for atrial fibrillation by Dr Roda Shutters.  The patient was admitted on 07/26/2017 with worsening right sided weakness.  Imaging demonstrated bilateral small embolic infarcts felt to be embolic 2/2 unknown source.  She has undergone workup for stroke including echocardiogram and carotid dopplers.  The patient has been monitored on telemetry which has demonstrated sinus rhythm with no arrhythmias.  Inpatient stroke work-up is to be completed with a TEE.   Echocardiogram this admission demonstrated EF 65-70%, mild LVH, grade 1 diastolic dysfunction, mild AS, severely calcified MV, LA 30.  Lab work is reviewed.  Prior to admission, the patient denies chest pain, shortness of breath, dizziness, palpitations, or syncope.  They are recovering from their stroke with plans to go to CIR at discharge.  Pt seen and examined today through use of tele-interpreter   Past Medical History:  Diagnosis Date  . Hypercholesteremia   . Hypertension   . MI (myocardial infarction) [facility])      Surgical History: No past surgical history on file.   Medications Prior to Admission  Medication Sig Dispense Refill Last Dose  . atorvastatin (LIPITOR) 20 MG tablet Take 20 mg by mouth daily.   Not Taking at Unknown time  . diltiazem (CARTIA XT) 180 MG 24 hr capsule Take 180 mg by mouth daily.   Not Taking at Unknown time  . lisinopril-hydrochlorothiazide (PRINZIDE,ZESTORETIC) 20-12.5 MG tablet Take 1 tablet by mouth 2 (two) times daily.   Not Taking at Unknown time  . meloxicam  (MOBIC) 7.5 MG tablet Take 7.5 mg by mouth daily.  2 Not Taking at Unknown time  . potassium chloride SA (K-DUR,KLOR-CON) 20 MEQ tablet Take 1 tablet (20 mEq total) by mouth daily. (Patient not taking: Reported on 07/26/2017) 5 tablet 0 Not Taking at Unknown time    Inpatient Medications:  . [MAR Hold] aspirin EC  81 mg Oral Daily  . [MAR Hold] atorvastatin  40 mg Oral q1800  . [MAR Hold] clopidogrel  75 mg Oral Daily  . [MAR Hold] enoxaparin (LOVENOX) injection  30 mg Subcutaneous Q24H  . [MAR Hold] Melatonin  3 mg Oral QHS    Allergies: No Known Allergies  Social History   Socioeconomic History  . Marital status: Married    Spouse name: Not on file  . Number of children: Not on file  . Years of education: Not on file  . Highest education level: Not on file  Occupational History  . Not on file  Social Needs  . Financial resource strain: Not on file  . Food insecurity:    Worry: Not on file    Inability: Not on file  . Transportation needs:    Medical: Not on file    Non-medical: Not on file  Tobacco Use  . Smoking status: Never Smoker  . Smokeless tobacco: Never Used  Substance and Sexual Activity  . Alcohol use: Never    Frequency: Never  . Drug use: Never  . Sexual activity: Not on file  Lifestyle  . Physical activity:    Days per week: Not on file    Minutes per session:  Not on file  . Stress: Not on file  Relationships  . Social connections:    Talks on phone: Not on file    Gets together: Not on file    Attends religious service: Not on file    Active member of club or organization: Not on file    Attends meetings of clubs or organizations: Not on file    Relationship status: Not on file  . Intimate partner violence:    Fear of current or ex partner: Not on file    Emotionally abused: Not on file    Physically abused: Not on file    Forced sexual activity: Not on file  Other Topics Concern  . Not on file  Social History Narrative  . Not on file      Family History: no premature CAD     Review of Systems: All other systems reviewed and are otherwise negative except as noted above.  Physical Exam: Vitals:   07/30/17 0431 07/30/17 0829 07/30/17 0832 07/30/17 0916  BP: 139/76 (!) 169/75 (!) 172/91 (!) 189/106  Pulse: 81 91  88  Resp: 16 18  13   Temp: 98.4 F (36.9 C) 98.4 F (36.9 C)  98.7 F (37.1 C)  TempSrc: Oral Oral  Oral  SpO2: 97% 100%  97%  Weight:      Height:        GEN- The patient is elderly appearing, alert and oriented x 3 today.   Head- normocephalic, atraumatic Eyes-  Sclera clear, conjunctiva pink Ears- hearing intact Oropharynx- clear Neck- supple Lungs- Clear to ausculation bilaterally, normal work of breathing Heart- Regular rate and rhythm  GI- soft, NT, ND, + BS Extremities- no clubbing, cyanosis, or edema MS- no significant deformity or atrophy Skin- no rash or lesion Psych- euthymic mood, full affect   Labs:   Lab Results  Component Value Date   WBC 5.6 07/30/2017   HGB 13.0 07/30/2017   HCT 39.4 07/30/2017   MCV 78.2 07/30/2017   PLT 131 (L) 07/30/2017    Recent Labs  Lab 07/30/17 0726  NA 141  K 4.6  CL 107  CO2 24  BUN 26*  CREATININE 0.74  CALCIUM 8.9  GLUCOSE 93     Radiology/Studies: Ct Angio Head W Or Wo Contrast  Result Date: 07/27/2017 CLINICAL DATA:  Right-sided weakness. EXAM: CT ANGIOGRAPHY HEAD AND NECK TECHNIQUE: Multidetector CT imaging of the head and neck was performed using the standard protocol during bolus administration of intravenous contrast. Multiplanar CT image reconstructions and MIPs were obtained to evaluate the vascular anatomy. Carotid stenosis measurements (when applicable) are obtained utilizing NASCET criteria, using the distal internal carotid diameter as the denominator. CONTRAST:  50mL ISOVUE-370 IOPAMIDOL (ISOVUE-370) INJECTION 76% COMPARISON:  None. FINDINGS: CTA NECK FINDINGS Aortic arch: Standard 3 vessel aortic arch with moderate diffuse  atherosclerotic plaque. No significant arch vessel origin stenosis. Slightly ectatic appearance of the distal ascending aorta, 3.6 cm diameter. Right carotid system: Patent without evidence of stenosis or dissection. Minimal atherosclerotic plaque at the carotid bifurcation. Left carotid system: Patent without evidence of stenosis or dissection. Minimal atherosclerotic plaque at the carotid bifurcation. Vertebral arteries: Patent and codominant without evidence of significant stenosis or dissection. Skeleton: Mild lower cervical disc degeneration. Other neck: No mass or lymph node enlargement. Upper chest: Clear lung apices. Review of the MIP images confirms the above findings CTA HEAD FINDINGS Anterior circulation: The internal carotid arteries are patent from skull base to carotid termini with mild-to-moderate  nonstenotic siphon atherosclerosis bilaterally. ACAs and MCAs are patent without evidence of proximal branch occlusion. There is a moderate left M1 stenosis. The right A1 segment is dominant. No aneurysm. Posterior circulation: The intracranial vertebral arteries are patent to the basilar. Right V4 segment calcified atherosclerosis results in mild stenosis. Patent left PICA, right AICA, and bilateral SCA origins are visualized. The basilar artery is patent with mild atherosclerotic type irregularity but no significant stenosis. Posterior communicating arteries are not identified. PCAs are patent without evidence of significant proximal stenosis. No aneurysm. Venous sinuses: Small left sigmoid sinus, poorly visualized and with evidence of slow flow on a 2017 brain MRI. Major dural venous sinuses are otherwise grossly patent allowing for arterial phase dominant contrast timing. Anatomic variants: Hypoplastic left A1. Delayed phase: Not performed. Review of the MIP images confirms the above findings IMPRESSION: 1. No emergent large vessel occlusion. 2. Intracranial atherosclerosis resulting in moderate left M1  and mild right V4 stenoses. 3. Widely patent carotid arteries. 4.  Aortic Atherosclerosis (ICD10-I70.0). These results were communicated to Dr. Amada Jupiter at 4:24 pm on 07/27/2017 by text page via the [facility] messaging system. Electronically Signed   By: Sebastian Ache M.D.   On: 07/27/2017 16:37   Ct Angio Neck W Or Wo Contrast  Result Date: 07/27/2017 CLINICAL DATA:  Right-sided weakness. EXAM: CT ANGIOGRAPHY HEAD AND NECK TECHNIQUE: Multidetector CT imaging of the head and neck was performed using the standard protocol during bolus administration of intravenous contrast. Multiplanar CT image reconstructions and MIPs were obtained to evaluate the vascular anatomy. Carotid stenosis measurements (when applicable) are obtained utilizing NASCET criteria, using the distal internal carotid diameter as the denominator. CONTRAST:  50mL ISOVUE-370 IOPAMIDOL (ISOVUE-370) INJECTION 76% COMPARISON:  None. FINDINGS: CTA NECK FINDINGS Aortic arch: Standard 3 vessel aortic arch with moderate diffuse atherosclerotic plaque. No significant arch vessel origin stenosis. Slightly ectatic appearance of the distal ascending aorta, 3.6 cm diameter. Right carotid system: Patent without evidence of stenosis or dissection. Minimal atherosclerotic plaque at the carotid bifurcation. Left carotid system: Patent without evidence of stenosis or dissection. Minimal atherosclerotic plaque at the carotid bifurcation. Vertebral arteries: Patent and codominant without evidence of significant stenosis or dissection. Skeleton: Mild lower cervical disc degeneration. Other neck: No mass or lymph node enlargement. Upper chest: Clear lung apices. Review of the MIP images confirms the above findings CTA HEAD FINDINGS Anterior circulation: The internal carotid arteries are patent from skull base to carotid termini with mild-to-moderate nonstenotic siphon atherosclerosis bilaterally. ACAs and MCAs are patent without evidence of proximal branch occlusion. There  is a moderate left M1 stenosis. The right A1 segment is dominant. No aneurysm. Posterior circulation: The intracranial vertebral arteries are patent to the basilar. Right V4 segment calcified atherosclerosis results in mild stenosis. Patent left PICA, right AICA, and bilateral SCA origins are visualized. The basilar artery is patent with mild atherosclerotic type irregularity but no significant stenosis. Posterior communicating arteries are not identified. PCAs are patent without evidence of significant proximal stenosis. No aneurysm. Venous sinuses: Small left sigmoid sinus, poorly visualized and with evidence of slow flow on a 2017 brain MRI. Major dural venous sinuses are otherwise grossly patent allowing for arterial phase dominant contrast timing. Anatomic variants: Hypoplastic left A1. Delayed phase: Not performed. Review of the MIP images confirms the above findings IMPRESSION: 1. No emergent large vessel occlusion. 2. Intracranial atherosclerosis resulting in moderate left M1 and mild right V4 stenoses. 3. Widely patent carotid arteries. 4.  Aortic Atherosclerosis (ICD10-I70.0). These results  were communicated to Dr. Amada Jupiter at 4:24 pm on 07/27/2017 by text page via the [facility] messaging system. Electronically Signed   By: Sebastian Ache M.D.   On: 07/27/2017 16:37   Mr Brain Wo Contrast  Result Date: 07/28/2017 CLINICAL DATA:  Followup stroke. Right-sided weakness presenting yesterday. EXAM: MRI HEAD WITHOUT CONTRAST TECHNIQUE: Multiplanar, multiecho pulse sequences of the brain and surrounding structures were obtained without intravenous contrast. COMPARISON:  CT studies done yesterday.  MRI 07/12/2015. FINDINGS: Brain: Diffusion imaging shows an acute small vessel stroke within the periphery of the inferior cerebellum on the left and superior cerebellum on the right. There is a 1 cm acute infarction in the left lateral thalamus. Punctate acute infarction in the right caudate/adjacent white matter tracks.  No other acute infarction. No evidence of swelling or hemorrhage. Chronic small-vessel ischemic changes affect the pons. There are a few old small vessel cerebellar infarctions. There chronic small-vessel ischemic changes of the thalami, basal ganglia and hemispheric white matter. No large vessel territory infarction. No mass lesion, hemorrhage, hydrocephalus or extra-axial collection. Vascular: Major vessels at the base of the brain show flow. Skull and upper cervical spine: Negative Sinuses/Orbits: Clear/normal Other: None IMPRESSION: 1 cm acute infarction in the left lateral thalamus. Subcentimeter acute infarction in the posterior inferior cerebellum on the left and in the superior cerebellum on the right. Punctate acute infarction within the right caudate/adjacent white matter tracks. Anterior and posterior circulation insults suggest embolic disease from the heart or ascending aorta. Electronically Signed   By: Paulina Fusi M.D.   On: 07/28/2017 08:38   Dg Chest Port 1 View  Result Date: 07/26/2017 CLINICAL DATA:  Weakness; c/o dizziness and genearalized "tiredness/" since yesterday EXAM: PORTABLE CHEST 1 VIEW COMPARISON:  Chest x-ray dated 08/20/2016. FINDINGS: Stable cardiomegaly. Aortic atherosclerosis. Lungs are clear. No pleural effusion or pneumothorax seen. No acute or suspicious osseous finding. IMPRESSION: 1. No active disease.  No evidence of pneumonia or pulmonary edema. 2. Cardiomegaly. Electronically Signed   By: Bary Richard M.D.   On: 07/26/2017 11:25   Ct Head Code Stroke Wo Contrast  Result Date: 07/27/2017 CLINICAL DATA:  Code stroke.  Right-sided weakness. EXAM: CT HEAD WITHOUT CONTRAST TECHNIQUE: Contiguous axial images were obtained from the base of the skull through the vertex without intravenous contrast. COMPARISON:  Brain MRI 07/12/2015 FINDINGS: Brain: There is no evidence of acute large territory infarct, intracranial hemorrhage, mass, midline shift, or extra-axial fluid  collection. There are small chronic infarcts in the cerebellum bilaterally including a new 3 mm infarct on the left. A dilated perivascular space is again noted inferiorly in the right basal ganglia. A subcentimeter low-density focus in the left lentiform nucleus is unchanged from the prior MRI and was without significant surrounding gliosis on that examination suggesting that this may also represent a dilated perivascular space rather than a chronic lacunar infarct. Generalized cerebral atrophy is mild for age. Vascular: Calcified atherosclerosis at the skull base. No hyperdense vessel. Skull: No fracture or destructive osseous lesion. Sinuses/Orbits: Visualized paranasal sinuses and mastoid air cells are clear. Prior right cataract extraction is noted. Other: None. ASPECTS [facility] Stroke Program Early CT Score) - Ganglionic level infarction (caudate, lentiform nuclei, internal capsule, insula, M1-M3 cortex): 7 - Supraganglionic infarction (M4-M6 cortex): 3 Total score (0-10 with 10 being normal): 10 IMPRESSION: 1. No evidence of acute intracranial abnormality. 2. ASPECTS is 10. These results were communicated to Dr. Amada Jupiter at 3:56 pm on 07/27/2017 by text page via the [facility] messaging  system. Electronically Signed   By: Sebastian Ache M.D.   On: 07/27/2017 15:57    12-lead ECG SR (personally reviewed) All prior EKG's in EPIC reviewed with no documented atrial fibrillation  Telemetry SR (personally reviewed)  Assessment and Plan:  1. Cryptogenic stroke The patient presents with cryptogenic stroke.  The patient has a TEE planned for this AM.  I spoke at length with the patient about monitoring for afib with an implantable loop recorder.  Risks, benefits, and alteratives to implantable loop recorder were discussed with the patient today.   At this time, the patient is very clear in their decision to decline implantable loop recorder. She would prefer outpatient 30 day event monitor first. I will arrange  through our office.   Please call with questions.   Gypsy Balsam, NP 07/30/2017 9:36 AM

## 2017-07-30 NOTE — Progress Notes (Signed)
  Echocardiogram 2D Echocardiogram has been performed.  Erin Patterson F 07/30/2017, 11:19 AM

## 2017-07-30 NOTE — Progress Notes (Signed)
OT Cancellation Note  Patient Details Name: Erin Patterson MRN: [id number] DOB: 11-02-1932   Cancelled Treatment:    Reason Eval/Treat Not Completed: Patient at procedure or test/ unavailable. TEE  Fredric Mare A Khanh Cordner A. Brett Albino, M.S., OTR/L Acute Rehab Department: [phone]   07/30/2017, 9:20 AM

## 2017-07-30 NOTE — Progress Notes (Signed)
84 y.o. Female admitted to [phone]. Patient received via wheelchair accompanied by Spouse. Upon admission assessment patient alert and oriented to self. Unable to fully assess neurological status due to language barrier. Native language is [place] Skin assessment rendered x 2 RN's. Skin is warm, dry and intact. Bilateral heels noted with minimal erythema and bogginess. Bilateral lower extremities floated on pillow. Patient is resting quietly in bed at present time. Bed in lowest position, side rails up x 3 and call bell within reach. Spouse presently at bedside. No s/ s acute distress noted. Will continue to monitor and assist as needed.

## 2017-07-30 NOTE — Interval H&P Note (Signed)
History and Physical Interval Note:  07/30/2017 10:32 AM  Erin Patterson  has presented today for surgery, with the diagnosis of stroke  The various methods of treatment have been discussed with the patient and family. After consideration of risks, benefits and other options for treatment, the patient has consented to  Procedure(s): TRANSESOPHAGEAL ECHOCARDIOGRAM (TEE) (N/A) as a surgical intervention .  The patient's history has been reviewed, patient examined, no change in status, stable for surgery.  I have reviewed the patient's chart and labs.  Questions were answered to the patient's satisfaction.     Darra Rosa [employer]

## 2017-07-30 NOTE — Plan of Care (Signed)
Pt is continent of B/B  Denies pain Skin intact Interpretor services used for admission

## 2017-07-30 NOTE — CV Procedure (Signed)
Procedure: TEE  Indication: CVA  Sedation: Versed 2.5 mg IV, Fentanyl 37.5 mcg IV  Findings: Please see echo section for full report.  Normal LV size with moderate LV hypertrophy.  EF 55-60%, no wall motion abnormalities.  Normal RV size and systolic function.  Trivial tricuspid regurgitation.  Mild mitral regurgitation.  Moderate calcification of the mitral valve and annulus but no stenosis.  Moderate aortic valve calcification with mild regurgitation and no stenosis.  Normal left atrial size, no LA appendage thrombus.  Normal right atrial size.  No PFO/ASD by color doppler.  Grade 3 plaque in the descending thoracic aorta and arch.    No source of embolus.   Erin Patterson 07/30/2017 10:57 AM

## 2017-07-30 NOTE — Progress Notes (Signed)
Marcello Fennel, MD      Marcello Fennel, MD  Physician  Physical Medicine and Rehabilitation      Consult Note  Signed     Date of Service:  07/28/2017 12:23 PM         Related encounter: ED to Hosp-Admission (Current) from 07/26/2017 in [facility]             Signed          Expand All Collapse All            Expand widget buttonCollapse widget button    Show:Clear all   ManualTemplateCopied  Added by:     Jacquelynn Cree, PA-C  Marcello Fennel, MD   Hover for detailscustomization button                                                                                                                                                                      untitled image              Physical Medicine and Rehabilitation Consult     Reason for Consult: Cerebellar stroke with functional deficits.   Referring Physician: Dr. Leveda Anna         HPI: Andreika Shermika Balthaser is a 84 y.o. non english speaking [place] female with history of HTN, CAD, CVA about 15 years ago?  who was admitted on 07/26/17 with weakness and dizziness. History taken from chart review.  CT head reviewed, unremarkable for acute intracranial process.  Patient was noted to be confused with reports that right weakness was at baseline. MRI brain done revealing acute left thalamic infarct, subcentimeter acute infarct in posterior left and superior right cerebellum suggestive of embolic disease from heart or aorta. 2D echo showed EF 65-70% with no wall abnormality, mild AS and severely calcified MV. Work up underway and DAPT recommended due to intracranial atherosclerosis. PT evaluation done revealing right lean with right foot drag affecting mobility. CIR  recommended due to functional deficits.         Review of Systems   Constitutional: Positive for malaise/fatigue. Negative for chills and fever.   HENT: Negative for hearing loss and tinnitus.    Eyes: Negative for blurred vision and double vision.   Respiratory: Negative for cough and shortness of breath.    Cardiovascular: Negative for chest pain and leg swelling.   Gastrointestinal: Negative for heartburn and nausea.   Musculoskeletal: Positive for back pain. Negative for joint pain and myalgias (left shoulder and back pain due to bieng in bed ).   Neurological: Positive for focal weakness. Negative for dizziness  and headaches.   (Performed by PA with interpretor, I was not able to perform due to language)             Past Medical History:    Diagnosis   Date    .   Hypercholesteremia        .   Hypertension        .   MI (myocardial infarction) (HCC)              No past surgical history on file.         Family Hx: reports all family members have been healthy.          Social History:  Married. Lives with husband and daughter (no other local family). She was independent PTA without AD. She reports that she has never smoked. She has never used smokeless tobacco. She reports that she does not drink alcohol or use drugs.         Allergies: No Known Allergies                Medications Prior to Admission    Medication   Sig   Dispense   Refill    .   atorvastatin (LIPITOR) 20 MG tablet   Take 20 mg by mouth daily.            Marland Kitchen   diltiazem (CARTIA XT) 180 MG 24 hr capsule   Take 180 mg by mouth daily.            Marland Kitchen   lisinopril-hydrochlorothiazide (PRINZIDE,ZESTORETIC) 20-12.5 MG tablet   Take 1 tablet by mouth 2 (two) times daily.            .   meloxicam (MOBIC) 7.5 MG tablet   Take 7.5 mg by mouth daily.       2    .   potassium chloride SA (K-DUR,KLOR-CON) 20 MEQ  tablet   Take 1 tablet (20 mEq total) by mouth daily. (Patient not taking: Reported on 07/26/2017)   5 tablet   0          Home:  Home Living  Family/patient expects to be discharged to:: Private residence  Living Arrangements: Spouse/significant other, Children  Available Help at Discharge: Family, Available 24 hours/day(Spouse 24 hours, limited assist, but can provide S)  Type of Home: Apartment  Home Access: Stairs to enter  Entrance Stairs-Number of Steps: 4  Entrance Stairs-Rails: Right  Home Layout: One level  Home Equipment: None   Functional History:  Prior Function  Level of Independence: Independent  Functional Status:   Mobility:  Bed Mobility  Overal bed mobility: Needs Assistance  Bed Mobility: Supine to Sit  Supine to sit: Min assist  General bed mobility comments: Min assist to help scoot hips to EOB   Transfers  Overall transfer level: Needs assistance  Equipment used: 1 person hand held assist, Rolling walker (2 wheeled)  Transfers: Sit to/from Stand  Sit to Stand: Min assist, Mod assist  General transfer comment: Min assist to steady; unstable in initial stand, requiring mod assist to keep balance; one instance of sitting back to bed without warning  Ambulation/Gait  Ambulation/Gait assistance: Mod assist  Ambulation Distance (Feet): 20 Feet  Assistive device: Rolling walker (2 wheeled)  Gait Pattern/deviations: Decreased step length - right, Decreased stance time - right, Decreased weight shift to left, Staggering right(noted occasional R foot drag)  General Gait Details: Right lean pervasive throughout walk in room; short step  length R leading to loss of balance to Right; Right lean becomes more pronounced with fatigue  Gait velocity: slowed       ADL:       Cognition:  Cognition  Overall Cognitive Status: Within Functional Limits for tasks assessed  Orientation Level: Oriented to person, Oriented to  place, Disoriented to time, Disoriented to situation  Cognition  Arousal/Alertness: Awake/alert  Behavior During Therapy: WFL for tasks assessed/performed  Overall Cognitive Status: Within Functional Limits for tasks assessed        Blood pressure 114/60, pulse 63, temperature 98.3 F (36.8 C), temperature source Oral, resp. rate 18, height 5\' 4"  (1.626 m), weight 50.2 kg (110 lb 10.7 oz), SpO2 97 %.  Physical Exam   Nursing note and vitals reviewed.  Constitutional: She appears well-developed.  Frail   HENT:   Head: Normocephalic and atraumatic.   Eyes: EOM are normal. Right eye exhibits no discharge. Left eye exhibits no discharge.   Neck: Normal range of motion. Neck supple.   Cardiovascular: Normal rate and regular rhythm.   Respiratory: Effort normal and breath sounds normal.   GI: Soft. Bowel sounds are normal.  Musculoskeletal:  No edema or tenderness in extremities  Neurological: She is alert.  Soft voice.  Oriented to self and place as hospital. Unable to state city, age or DOB. Looked to husband for answers to biographic questions (per PA with interpretor).  Able to follow demonstrates commands with assistance of husband. Motor: Grossly RUE/RLE: 4--4/5 proximal to distal.  Ataxia RUE.  LUE/LLE: 4/5 proximal to distal   Skin: Skin is warm and dry.  Psychiatric:  Unable to assess due to language         Lab Results Last 24 Hours  Imaging Results (Last 48 hours)                                                     Assessment/Plan:  Diagnosis: Acute left thalamic, posterior left and superior right cerebellum infarcts  Labs and images independently reviewed.  Records reviewed and summated above.     1.Does the need for close, 24 hr/day medical supervision in concert with the patient's rehab needs make it unreasonable for this patient to be served in a less intensive setting? Yes    2.Co-Morbidities requiring supervision/potential complications: HTN (monitor and provide prns in accordance with increased physical exertion and pain), CAD, CVA about 15 years ago with residual deficits, AKI (avoid nephrotoxic meds)   3.Due to safety, disease management, medication administration and patient education, does the patient require 24 hr/day rehab nursing? Yes   4.Does the patient require coordinated care of a physician, rehab nurse, PT (1-2 hrs/day, 5 days/week), OT (1-2 hrs/day, 5 days/week) and SLP (1-2 hrs/day, 5 days/week) to address physical and functional deficits in the context of the above medical diagnosis(es)? Yes Addressing deficits in the following areas: balance, endurance, locomotion, strength, transferring, bathing, dressing, toileting, speech and psychosocial support   5.Can the patient actively participate in an intensive therapy program of at least 3 hrs of therapy per day at least 5 days per week? Yes   6.The potential for patient to make measurable gains while on inpatient rehab is excellent   7.Anticipated functional outcomes upon discharge from inpatient rehab are supervision and min assist  with PT,  supervision and min assist with OT, modified independent and supervision with SLP.   8.Estimated rehab length of stay to reach the above functional goals is: 11-16 days.   9.Anticipated D/C setting: Home   10.Anticipated post D/C treatments: HH therapy and Home excercise program   11.Overall Rehab/Functional Prognosis: good      RECOMMENDATIONS:  This patient's condition is appropriate for continued rehabilitative care in the following setting: CIR if husband able to provide assistance at discharge (UE amputation).  Patient has agreed to participate in recommended program. Potentially  Note that insurance prior authorization may be required for reimbursement for recommended care.     Comment: Rehab Admissions Coordinator to follow up.        I have personally performed a face to face diagnostic evaluation, including, but not limited to relevant history and physical exam findings, of this patient and developed relevant assessment and plan.  Additionally, I have reviewed and concur with the physician assistant's documentation above.      Maryla Morrow, MD, ABPMR  Jacquelynn Cree, PA-C  07/28/2017                Revision History                                        Routing History

## 2017-07-30 NOTE — H&P (Signed)
Physical Medicine and Rehabilitation Admission H&P    Chief Complaint  Patient presents with  . Stroke with functional decline.     HPI: Erin Patterson is an 84 year old non-english speaking [place] female with history of HTN, CAD, CVA with mild residual R-HP who was admitted on 07/26/17 with HA, dizziness with  two episodes of near falls and hypertensive emergency with BP 190/94 at admission.  History taken from chart review. Patient was noted to be confused with reports of that right-sided weakness was at baseline.  CT of head was negative.  CTA head/neck showed no emergent large vessel occlusion and moderate left M1 and mild right V4 stenosis. MRI of brain done revealing acute left thalamic infarct, subcentimeter acute infarct in posterior left and superior right cerebellum suggestive of embolic disease from heart or aorta.  2D echo done showing EF of 65 to 70% with no wall abnormality, mild left ear and severely calcified mitral valve. She was started on DAPT X 3 weeks with recommendations to continue ASA 81 mg daily after that period. Dr. Erlinda Hong felt "bilateral embolic stroke due to unknown source but suspicious for A fib."    TEE done today moderate calcifications on MV and AV with grade 3 plaque in descending thoracic aorta and arch and negative for PFO/ASD. Patient declined Loop recorder placement and will need 30 day event monitor after discharge.  BLE dopplers ordered and pending. Patient continues to be limited by right sided weakness and balance deficits.  Therapy ongoing and CIR recommended due to functional deficits.   Review of Systems  Constitutional: Negative for chills and fever.  HENT: Negative for hearing loss.   Eyes: Negative for blurred vision and double vision.  Respiratory: Negative for cough and shortness of breath.   Cardiovascular: Negative for chest pain and palpitations.  Gastrointestinal: Negative for heartburn and nausea.  Genitourinary: Negative for dysuria  and urgency.  Musculoskeletal: Negative for back pain and myalgias.  Neurological: Positive for dizziness, focal weakness and weakness.  Psychiatric/Behavioral: The patient is nervous/anxious and has insomnia.   All other systems reviewed and are negative.    Past Medical History:  Diagnosis Date  . Hypercholesteremia   . Hypertension   . MI (myocardial infarction) ([place])   . Stroke (cerebrum) ([place])    years ago--episode of "bleeding from right ear and onset of weakness"     No past surgical history.   Family Hx: Reports all were healthy except for her. Limited due to language barrier.   Social History:  Married. Lives with daughter and husband. Daughter "not married and works all the time". Has multiple children back in [place].  She reports that she has never smoked. She has never used smokeless tobacco. She reports that she does not drink alcohol or use drugs.   Allergies: No Known Allergies   Medications Prior to Admission  Medication Sig Dispense Refill  . atorvastatin (LIPITOR) 20 MG tablet Take 20 mg by mouth daily.    Marland Kitchen diltiazem (CARTIA XT) 180 MG 24 hr capsule Take 180 mg by mouth daily.    Marland Kitchen lisinopril-hydrochlorothiazide (PRINZIDE,ZESTORETIC) 20-12.5 MG tablet Take 1 tablet by mouth 2 (two) times daily.    . meloxicam (MOBIC) 7.5 MG tablet Take 7.5 mg by mouth daily.  2  . potassium chloride SA (K-DUR,KLOR-CON) 20 MEQ tablet Take 1 tablet (20 mEq total) by mouth daily. (Patient not taking: Reported on 07/26/2017) 5 tablet 0    Drug Regimen Review  Drug  regimen was reviewed and remains appropriate with no significant issues identified  Home: Home Living Family/patient expects to be discharged to:: Private residence Living Arrangements: (lives with spouse and daughter; daughter works) Available Help at Discharge: (spouse avialable 24/7; daughter works) Type of Home: Apartment Home Access: Stairs to enter Technical brewer of Steps: 4 Entrance Stairs-Rails:  Right [place]: One level Bathroom Shower/Tub: Public librarian, Architectural technologist: Programmer, systems: Yes Home Equipment: None  Lives With: Spouse, Daughter   Functional History: Prior Function Level of Independence: Independent  Functional Status:  Mobility: Bed Mobility Overal bed mobility: Needs Assistance Bed Mobility: Supine to Sit Supine to sit: Min guard General bed mobility comments: min guard for safety; increased time and effort Transfers Overall transfer level: Needs assistance Equipment used: Rolling walker (2 wheeled) Transfers: Sit to/from Stand Sit to Stand: Min assist General transfer comment: assist to steady Ambulation/Gait Ambulation/Gait assistance: Mod assist, Min assist Ambulation Distance (Feet): 80 Feet Assistive device: 1 person hand held assist, Rolling walker (2 wheeled) Gait Pattern/deviations: Step-through pattern, Decreased step length - left, Decreased stride length, Decreased dorsiflexion - right, Decreased step length - right General Gait Details: pt with R LE weakness that increases with increased distance; assistance required for balance and weigth shifting; cues for increased bilat step length  Gait velocity: decreased    ADL: ADL Overall ADL's : Needs assistance/impaired Eating/Feeding: Set up Grooming: Wash/dry hands, Wash/dry face, Oral care, Set up, Supervision/safety, Sitting Upper Body Bathing: Set up, Supervision/ safety, Sitting Lower Body Bathing: Moderate assistance, Sit to/from stand Upper Body Dressing : Set up, Supervision/safety, Sitting Lower Body Dressing: Moderate assistance, Sit to/from stand Toilet Transfer: Minimal assistance, RW, Regular Toilet, Grab bars Toileting- Clothing Manipulation and Hygiene: Minimal assistance, Sit to/from stand Functional mobility during ADLs: Minimal assistance, Rolling walker General ADL Comments: Pt ambulating with min - mod A to bathroom with use of RW. Pt able  to perform hygiene and clothing management after toileting with steady assistance.   Cognition: Cognition Overall Cognitive Status: Within Functional Limits for tasks assessed Orientation Level: Oriented to person, Oriented to place, Oriented to time, Disoriented to situation Cognition Arousal/Alertness: Awake/alert Behavior During Therapy: WFL for tasks assessed/performed Overall Cognitive Status: Within Functional Limits for tasks assessed   Blood pressure (!) 197/92, pulse 86, temperature 98.7 F (37.1 C), temperature source Oral, resp. rate 16, height 5' 4"  (1.626 m), weight 50.2 kg (110 lb 10.7 oz), SpO2 98 %. Physical Exam  Nursing note and vitals reviewed. Constitutional: She appears well-developed.  Frail  HENT:  Head: Normocephalic and atraumatic.  Eyes: EOM are normal. Right eye exhibits no discharge. Left eye exhibits no discharge.  Neck: Normal range of motion. Neck supple. No tracheal deviation present.  Cardiovascular: Normal rate and regular rhythm.  Respiratory: Effort normal and breath sounds normal.  GI: Soft. Bowel sounds are normal.  Musculoskeletal:  No edema or tenderness in extremities  Neurological: She is alert.  Oriented to self. She thought that she was in the hospital. Able to state age today.  Soft voice. A ble to follow simple motor commands with min cues.  Motor: Left upper extremity/left lower cervical and 4+/5 proximal to distal Right upper extremity/right lower extremity: 5/5 proximal to distal Sensation intact light touch DTRs hyperreflexic left upper extremity  Skin: Skin is dry.  Psychiatric: She has a normal mood and affect. Her behavior is normal.    Results for orders placed or performed during the hospital encounter of 07/26/17 (from the past  48 hour(s))  Basic metabolic panel     Status: Abnormal   Collection Time: 07/29/17  7:29 AM  Result Value Ref Range   Sodium 139 135 - 145 mmol/L   Potassium 3.9 3.5 - 5.1 mmol/L   Chloride  105 101 - 111 mmol/L   CO2 25 22 - 32 mmol/L   Glucose, Bld 97 65 - 99 mg/dL   BUN 29 (H) 6 - 20 mg/dL   Creatinine, Ser 0.81 0.44 - 1.00 mg/dL   Calcium 8.9 8.9 - 10.3 mg/dL   GFR calc non Af Amer >60 >60 mL/min   GFR calc Af Amer >60 >60 mL/min    Comment: (NOTE) The eGFR has been calculated using the CKD EPI equation. This calculation has not been validated in all clinical situations. eGFR's persistently <60 mL/min signify possible Chronic Kidney Disease.    Anion gap 9 5 - 15    Comment: Performed at [facility] [address] [place], [place] [address]  Basic metabolic panel     Status: Abnormal   Collection Time: 07/30/17  7:26 AM  Result Value Ref Range   Sodium 141 135 - 145 mmol/L   Potassium 4.6 3.5 - 5.1 mmol/L   Chloride 107 101 - 111 mmol/L   CO2 24 22 - 32 mmol/L   Glucose, Bld 93 65 - 99 mg/dL   BUN 26 (H) 6 - 20 mg/dL   Creatinine, Ser 0.74 0.44 - 1.00 mg/dL   Calcium 8.9 8.9 - 10.3 mg/dL   GFR calc non Af Amer >60 >60 mL/min   GFR calc Af Amer >60 >60 mL/min    Comment: (NOTE) The eGFR has been calculated using the CKD EPI equation. This calculation has not been validated in all clinical situations. eGFR's persistently <60 mL/min signify possible Chronic Kidney Disease.    Anion gap 10 5 - 15    Comment: Performed at [facility] [address], [place], [place] [address]  CBC     Status: Abnormal   Collection Time: 07/30/17  7:26 AM  Result Value Ref Range   WBC 5.6 4.0 - 10.5 K/uL   RBC 5.04 3.87 - 5.11 MIL/uL   Hemoglobin 13.0 12.0 - 15.0 g/dL   HCT 39.4 36.0 - 46.0 %   MCV 78.2 78.0 - 100.0 fL   MCH 25.8 (L) 26.0 - 34.0 pg   MCHC 33.0 30.0 - 36.0 g/dL   RDW 15.9 (H) 11.5 - 15.5 %   Platelets 131 (L) 150 - 400 K/uL    Comment: Performed at [facility], [place] [address] [place], [place] [address]   No results found.   Medical Problem List and Plan: 1.  Right sided weakness and balance deficits  secondary to bilateral  CVA 2.  DVT Prophylaxis/Anticoagulation: Pharmaceutical: Lovenox 3. Pain Management: Off Meloxicam. Right trap pain has resolved.  4. Mood: LCSW to follow up for evaluation and support.  5. Neuropsych: This patient is not capable of making decisions on herown behalf. 6. Skin/Wound Care: Routine pressure relief measures.  7. Fluids/Electrolytes/Nutrition: Monitor I/O. Check lytes in am.  8. Dyslipidemia: Now on Lipitor.  9. Sleep disturbance: Continue melatonin.  10. HTN: Monitor BP bid with permissive HTN for 3-5 days--currently labile and may need prinzide resumed.  11. Thrombocytopenia: Monitor CBC serial and  for signs of bleeding.    Post Admission Physician Evaluation: 1. Preadmission assessment reviewed and changes made below. 2. Functional deficits secondary  to  bilateral CVA. 3. Patient is admitted to receive collaborative, interdisciplinary care between the physiatrist, rehab nursing staff, and therapy team. 4. Patient's level of medical complexity and substantial therapy needs in context of that medical necessity cannot be provided at a lesser intensity of care such as a SNF. 5. Patient has experienced substantial functional loss from his/her baseline which was documented above under the "Functional History" and "Functional Status" headings.  Judging by the patient's diagnosis, physical exam, and functional history, the patient has potential for functional progress which will result in measurable gains while on inpatient rehab.  These gains will be of substantial and practical use upon discharge  in facilitating mobility and self-care at the household level. 90. Physiatrist will provide 24 hour management of medical needs as well as oversight of the therapy plan/treatment and provide guidance as appropriate regarding the interaction of the two. 7. 24 hour rehab nursing will assist with safety, disease management and patient education  and help integrate therapy concepts,  techniques,education, etc. 8. PT will assess and treat for/with: Lower extremity strength, range of motion, stamina, balance, functional mobility, safety, adaptive techniques and equipment, wound care, coping skills, pain control, stroke education. Goals are: Supervision/Min A. 9. OT will assess and treat for/with: ADL's, functional mobility, safety, upper extremity strength, adaptive techniques and equipment, wound mgt, ego support, and community reintegration.   Goals are: Supervision/Min A. Therapy may proceed with showering this patient. 10. SLP will assess and treat for/with: high-level cognitive deficits. Goals are: Supervision/Min A. 11. Case Management and Social Worker will assess and treat for psychological issues and discharge planning. 12. Team conference will be held weekly to assess progress toward goals and to determine barriers to discharge. 13. Patient will receive at least 3 hours of therapy per day at least 5 days per week. 14. ELOS: 12-16 days.       15. Prognosis:  good  I have personally performed a face to face diagnostic evaluation, including, but not limited to relevant history and physical exam findings, of this patient and developed relevant assessment and plan.  Additionally, I have reviewed and concur with the physician assistant's documentation above.  Delice Lesch, MD, ABPMR Bary Leriche, PA-C 07/30/2017
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
## 2017-07-30 NOTE — Progress Notes (Addendum)
Family Medicine Teaching Service Daily Progress Note Intern Pager: [phone]  Patient name: Erin Patterson Medical record number: [id number] Date of birth: 11-07-1932 Age: 84 y.o. Gender: female  Primary Care Provider: Jackie Plum, MD Consultants: none Code Status: Full  Needs Vietnamese interpreter  Pt Overview and Major Events to Date:  6/1 admitted for hypertensive urgency 6/2 right sided weakness, code stroke called, CT head and CTA head and neck negative 6/3 MRI brain positive for multiple strokes, likely embolic; TTE negative for embolic source  Assessment and Plan: Erin Patterson is a 84 y.o. female with a past medical history significant for hypertension, hyperlipidemia, CAD and previous MI who presented with weakness and dizziness in the setting of hypertensive urgency, found to live embolic strokes on MRI on 6/3.  Multiple strokes, likely embolic source. Currently looking for source of emboli, will have TEE at 1000 on 6/5, although she declines a loop recorder.  PT evaluation significant for R LE weakness and balance deficits.  Will go to CIR for rehab after being medically cleared. - permissive hypertension with slow normalization: BP 139/76 on 6/5 - appreciate neuro recs - Plavix 75 mg + ASA 81 mg for three weeks, then ASA alone - Lipitor 40 mg daily - PT/OT/SLP - f/u LE venous dopplers - f/u TEE results  CAD, h/o MI, HLD. Stable. No chest pain during admission so far. - continue atorvastatin and ASA as above  Hypokalemia, resolved. K 4.6 on 6/5 - monitor BMP  AKI, resolved. Creatinine trending up since admission, 0.71>1.26>1.56>0.81>0.74 on 6/5.  Was possibly due to fluid loss from Lasix administration; BNP of 345 likely due to hypertensive urgency rather than heart failure. - daily BMP - Fractional excretion of urea to determine prerenal vs. Intrinsic cause of AKI (in process)  Upper back muscle soreness, acute. Patient developed a sore muscle in her L upper  back on 6/5.  Soreness confined to this area. - k pad and tylenol (if not on pain medicine after procedure) for pain relief  FEN/GI: NPO for TEE PPx: lovenox  Disposition: CIR on 6/5  Subjective:  Her main concern is her L upper back pain.  She is less tired today.  She understands the purpose of the TEE.  Objective: Temp:  [98 F (36.7 C)-98.6 F (37 C)] 98.4 F (36.9 C) (06/05 0431) Pulse Rate:  [80-94] 81 (06/05 0431) Resp:  [15-18] 16 (06/05 0431) BP: (123-152)/(69-86) 139/76 (06/05 0431) SpO2:  [97 %-100 %] 97 % (06/05 0431) Physical Exam: General: lying in bed comfortably, alert and oriented Cardiovascular: RRR, 1/6 systolic murmur Respiratory: CTAB with no respiratory distress, no crackles Abdomen: soft, nontender, nondistended, normal bowel sounds Extremities: no edema of lower extremities Neurologic: 4/5 strength bilaterally in lower and upper extremities, no appreciable difference in R vs L, no facial droop or other asymmetry, no difference in R and L sensation, unchanged from 6/4 Musculoskeletal: tender to palpation in trapezius/latissimus dorsi medial to L scapula.  No tenderness along L shoulder  Laboratory: Recent Labs  Lab 07/26/17 1522 07/27/17 0712 07/28/17 0455  WBC 4.8 7.6 6.8  HGB 13.5 13.5 13.7  HCT 42.1 41.2  39.4 40.9  PLT 133* 168 157   Recent Labs  Lab 07/27/17 0712 07/28/17 0455 07/29/17 0729  NA 136 139 139  K 3.6 3.6 3.9  CL 101 102 105  CO2 23 24 25   BUN 26* 44* 29*  CREATININE 1.26* 1.56* 0.81  CALCIUM 9.8 10.0 8.9  GLUCOSE 115* 114* 97  Lipid Panel     Component Value Date/Time   CHOL 201 (H) 07/27/2017 0712   TRIG 44 07/27/2017 0712   HDL 66 07/27/2017 0712   CHOLHDL 3.0 07/27/2017 0712   VLDL 9 07/27/2017 0712   LDLCALC 126 (H) 07/27/2017 0712    Imaging/Diagnostic Tests: No results found.  Lennox Solders, MD 07/30/2017, 7:07 AM PGY-1, [facility] Family Medicine FPTS Intern pager: [phone], text pages  welcome
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
## 2017-07-30 NOTE — Discharge Summary (Signed)
Family Medicine Teaching [facility] Discharge Summary  Patient name: Erin Patterson Medical record number: [id number] Date of birth: 1932/07/11 Age: 84 y.o. Gender: female Date of Admission: 07/26/2017  Date of Discharge: 07/30/17 Admitting Physician: Lovena Neighbours, MD  Primary Care Provider: Jackie Plum, MD Consultants: Heart Failure, Neurology  Indication for Hospitalization: weakness, dizziness, hypertensive urgency  Discharge Diagnoses/Problem List:  Multiple embolic strokes, embolic source unidentified Hypertensive urgency, resolved CAD, h/o MI HLD  Disposition: CIR  Discharge Condition: stable, BP improved, still with right sided weakness especially noted on ambulation  Discharge Exam: General: lying in bed comfortably, alert and oriented Cardiovascular: RRR, 1/6 systolic murmur Respiratory: CTAB with no respiratory distress, no crackles Abdomen: soft, nontender, nondistended, normal bowel sounds Extremities: no edema of lower extremities Neurologic: 4/5 strength bilaterally in lower and upper extremities, no appreciable difference in R vs L, no facial droop or other asymmetry, no difference in R and L sensation, unchanged from 6/4 Musculoskeletal: tender to palpation in region of trapezius/latissimus dorsi medial to L scapula.  No tenderness along L shoulder  Brief Hospital Course:  Erin Patterson was admitted on 6/1 due to global weakness and dizziness as well as hypertensive urgency with BP in the 190s/100s.  Also was noted to have cardiomegaly, crackles in lung fields, and a BNP of 345 so thought to have possible fluid overload (although BNP elevation likely was due to hypertension).  Given hydralazine for BP and lasix for diuresis, with goal BP reduction of 25% in first 24 hours.  BP dropped below goal level, into the 120s/80s, on 6/2.  Patient reported right sided weakness and loss of sensation on 6/2 and was reported to have this weakness when ambulating.  Code  stroke was called, CT head and CTA head and neck were negative, but MRI brain showed multiple embolic strokes on 6/3.  Patient was placed on atorvastatin 40 mg (took 20 mg at home), ASA, and plavix.  Permissive HTN was allowed, so patient was not kept on any hypertension medications.  A TTE was performed on 6/3, which was negative for embolic source.  A TEE was performed on 6/5 which was also negative.  Patient declined placement of loop recorder.  She remained stable neurologically (still with right sided weakness noted during ambulation) and was admitted to CIR on 6/5.  Issues for Follow Up:  1. Patient's blood pressure can be slowly normalized.  Has been slightly elevated with a few readings in the hypertensive urgency range intermittently.  Continue to monitor and add blood pressure medication as needed. 2. Patient should continue ASA and plavix for three weeks before continuing on ASA alone. 3. Patient reported L shoulder pain on 6/5.  A K pad was provided for likely muscle strain.  Continue to monitor. 4. Patient will need 30 day event monitor outpatient since she declined loop recorder placement.  Significant Procedures: TEE  Significant Labs and Imaging:  Recent Labs  Lab 07/27/17 0712 07/28/17 0455 07/30/17 0726  WBC 7.6 6.8 5.6  HGB 13.5 13.7 13.0  HCT 41.2  39.4 40.9 39.4  PLT 168 157 131*   Recent Labs  Lab 07/26/17 1028 07/26/17 1522 07/27/17 0712 07/28/17 0455 07/29/17 0729 07/30/17 0726  NA 140  --  136 139 139 141  K 3.2*  --  3.6 3.6 3.9 4.6  CL 105  --  101 102 105 107  CO2 23  --  23 24 25 24   GLUCOSE 106*  --  115* 114* 97 93  BUN 9  --  26* 44* 29* 26*  CREATININE 0.76 0.71 1.26* 1.56* 0.81 0.74  CALCIUM 8.9  --  9.8 10.0 8.9 8.9  MG  --  1.7  --   --   --   --   PHOS  --  2.4*  --   --   --   --     Ct Angio Head W Or Wo Contrast  Result Date: 07/27/2017 CLINICAL DATA:  Right-sided weakness. EXAM: CT ANGIOGRAPHY HEAD AND NECK TECHNIQUE: Multidetector  CT imaging of the head and neck was performed using the standard protocol during bolus administration of intravenous contrast. Multiplanar CT image reconstructions and MIPs were obtained to evaluate the vascular anatomy. Carotid stenosis measurements (when applicable) are obtained utilizing NASCET criteria, using the distal internal carotid diameter as the denominator. CONTRAST:  50mL ISOVUE-370 IOPAMIDOL (ISOVUE-370) INJECTION 76% COMPARISON:  None. FINDINGS: CTA NECK FINDINGS Aortic arch: Standard 3 vessel aortic arch with moderate diffuse atherosclerotic plaque. No significant arch vessel origin stenosis. Slightly ectatic appearance of the distal ascending aorta, 3.6 cm diameter. Right carotid system: Patent without evidence of stenosis or dissection. Minimal atherosclerotic plaque at the carotid bifurcation. Left carotid system: Patent without evidence of stenosis or dissection. Minimal atherosclerotic plaque at the carotid bifurcation. Vertebral arteries: Patent and codominant without evidence of significant stenosis or dissection. Skeleton: Mild lower cervical disc degeneration. Other neck: No mass or lymph node enlargement. Upper chest: Clear lung apices. Review of the MIP images confirms the above findings CTA HEAD FINDINGS Anterior circulation: The internal carotid arteries are patent from skull base to carotid termini with mild-to-moderate nonstenotic siphon atherosclerosis bilaterally. ACAs and MCAs are patent without evidence of proximal branch occlusion. There is a moderate left M1 stenosis. The right A1 segment is dominant. No aneurysm. Posterior circulation: The intracranial vertebral arteries are patent to the basilar. Right V4 segment calcified atherosclerosis results in mild stenosis. Patent left PICA, right AICA, and bilateral SCA origins are visualized. The basilar artery is patent with mild atherosclerotic type irregularity but no significant stenosis. Posterior communicating arteries are not  identified. PCAs are patent without evidence of significant proximal stenosis. No aneurysm. Venous sinuses: Small left sigmoid sinus, poorly visualized and with evidence of slow flow on a 2017 brain MRI. Major dural venous sinuses are otherwise grossly patent allowing for arterial phase dominant contrast timing. Anatomic variants: Hypoplastic left A1. Delayed phase: Not performed. Review of the MIP images confirms the above findings IMPRESSION: 1. No emergent large vessel occlusion. 2. Intracranial atherosclerosis resulting in moderate left M1 and mild right V4 stenoses. 3. Widely patent carotid arteries. 4.  Aortic Atherosclerosis (ICD10-I70.0). These results were communicated to Dr. Amada Jupiter at 4:24 pm on 07/27/2017 by text page via the [facility] messaging system. Electronically Signed   By: Sebastian Ache M.D.   On: 07/27/2017 16:37   Ct Angio Neck W Or Wo Contrast  Result Date: 07/27/2017 CLINICAL DATA:  Right-sided weakness. EXAM: CT ANGIOGRAPHY HEAD AND NECK TECHNIQUE: Multidetector CT imaging of the head and neck was performed using the standard protocol during bolus administration of intravenous contrast. Multiplanar CT image reconstructions and MIPs were obtained to evaluate the vascular anatomy. Carotid stenosis measurements (when applicable) are obtained utilizing NASCET criteria, using the distal internal carotid diameter as the denominator. CONTRAST:  50mL ISOVUE-370 IOPAMIDOL (ISOVUE-370) INJECTION 76% COMPARISON:  None. FINDINGS: CTA NECK FINDINGS Aortic arch: Standard 3 vessel aortic arch with moderate diffuse atherosclerotic plaque. No significant arch vessel origin stenosis. Slightly ectatic  appearance of the distal ascending aorta, 3.6 cm diameter. Right carotid system: Patent without evidence of stenosis or dissection. Minimal atherosclerotic plaque at the carotid bifurcation. Left carotid system: Patent without evidence of stenosis or dissection. Minimal atherosclerotic plaque at the carotid  bifurcation. Vertebral arteries: Patent and codominant without evidence of significant stenosis or dissection. Skeleton: Mild lower cervical disc degeneration. Other neck: No mass or lymph node enlargement. Upper chest: Clear lung apices. Review of the MIP images confirms the above findings CTA HEAD FINDINGS Anterior circulation: The internal carotid arteries are patent from skull base to carotid termini with mild-to-moderate nonstenotic siphon atherosclerosis bilaterally. ACAs and MCAs are patent without evidence of proximal branch occlusion. There is a moderate left M1 stenosis. The right A1 segment is dominant. No aneurysm. Posterior circulation: The intracranial vertebral arteries are patent to the basilar. Right V4 segment calcified atherosclerosis results in mild stenosis. Patent left PICA, right AICA, and bilateral SCA origins are visualized. The basilar artery is patent with mild atherosclerotic type irregularity but no significant stenosis. Posterior communicating arteries are not identified. PCAs are patent without evidence of significant proximal stenosis. No aneurysm. Venous sinuses: Small left sigmoid sinus, poorly visualized and with evidence of slow flow on a 2017 brain MRI. Major dural venous sinuses are otherwise grossly patent allowing for arterial phase dominant contrast timing. Anatomic variants: Hypoplastic left A1. Delayed phase: Not performed. Review of the MIP images confirms the above findings IMPRESSION: 1. No emergent large vessel occlusion. 2. Intracranial atherosclerosis resulting in moderate left M1 and mild right V4 stenoses. 3. Widely patent carotid arteries. 4.  Aortic Atherosclerosis (ICD10-I70.0). These results were communicated to Dr. Amada Jupiter at 4:24 pm on 07/27/2017 by text page via the [facility] messaging system. Electronically Signed   By: Sebastian Ache M.D.   On: 07/27/2017 16:37   Mr Brain Wo Contrast  Result Date: 07/28/2017 CLINICAL DATA:  Followup stroke. Right-sided  weakness presenting yesterday. EXAM: MRI HEAD WITHOUT CONTRAST TECHNIQUE: Multiplanar, multiecho pulse sequences of the brain and surrounding structures were obtained without intravenous contrast. COMPARISON:  CT studies done yesterday.  MRI 07/12/2015. FINDINGS: Brain: Diffusion imaging shows an acute small vessel stroke within the periphery of the inferior cerebellum on the left and superior cerebellum on the right. There is a 1 cm acute infarction in the left lateral thalamus. Punctate acute infarction in the right caudate/adjacent white matter tracks. No other acute infarction. No evidence of swelling or hemorrhage. Chronic small-vessel ischemic changes affect the pons. There are a few old small vessel cerebellar infarctions. There chronic small-vessel ischemic changes of the thalami, basal ganglia and hemispheric white matter. No large vessel territory infarction. No mass lesion, hemorrhage, hydrocephalus or extra-axial collection. Vascular: Major vessels at the base of the brain show flow. Skull and upper cervical spine: Negative Sinuses/Orbits: Clear/normal Other: None IMPRESSION: 1 cm acute infarction in the left lateral thalamus. Subcentimeter acute infarction in the posterior inferior cerebellum on the left and in the superior cerebellum on the right. Punctate acute infarction within the right caudate/adjacent white matter tracks. Anterior and posterior circulation insults suggest embolic disease from the heart or ascending aorta. Electronically Signed   By: Paulina Fusi M.D.   On: 07/28/2017 08:38   Dg Chest Port 1 View  Result Date: 07/26/2017 CLINICAL DATA:  Weakness; c/o dizziness and genearalized "tiredness/" since yesterday EXAM: PORTABLE CHEST 1 VIEW COMPARISON:  Chest x-ray dated 08/20/2016. FINDINGS: Stable cardiomegaly. Aortic atherosclerosis. Lungs are clear. No pleural effusion or pneumothorax seen. No acute or  suspicious osseous finding. IMPRESSION: 1. No active disease.  No evidence of  pneumonia or pulmonary edema. 2. Cardiomegaly. Electronically Signed   By: Bary Richard M.D.   On: 07/26/2017 11:25   Ct Head Code Stroke Wo Contrast  Result Date: 07/27/2017 CLINICAL DATA:  Code stroke.  Right-sided weakness. EXAM: CT HEAD WITHOUT CONTRAST TECHNIQUE: Contiguous axial images were obtained from the base of the skull through the vertex without intravenous contrast. COMPARISON:  Brain MRI 07/12/2015 FINDINGS: Brain: There is no evidence of acute large territory infarct, intracranial hemorrhage, mass, midline shift, or extra-axial fluid collection. There are small chronic infarcts in the cerebellum bilaterally including a new 3 mm infarct on the left. A dilated perivascular space is again noted inferiorly in the right basal ganglia. A subcentimeter low-density focus in the left lentiform nucleus is unchanged from the prior MRI and was without significant surrounding gliosis on that examination suggesting that this may also represent a dilated perivascular space rather than a chronic lacunar infarct. Generalized cerebral atrophy is mild for age. Vascular: Calcified atherosclerosis at the skull base. No hyperdense vessel. Skull: No fracture or destructive osseous lesion. Sinuses/Orbits: Visualized paranasal sinuses and mastoid air cells are clear. Prior right cataract extraction is noted. Other: None. ASPECTS [facility] Stroke Program Early CT Score) - Ganglionic level infarction (caudate, lentiform nuclei, internal capsule, insula, M1-M3 cortex): 7 - Supraganglionic infarction (M4-M6 cortex): 3 Total score (0-10 with 10 being normal): 10 IMPRESSION: 1. No evidence of acute intracranial abnormality. 2. ASPECTS is 10. These results were communicated to Dr. Amada Jupiter at 3:56 pm on 07/27/2017 by text page via the [facility] messaging system. Electronically Signed   By: Sebastian Ache M.D.   On: 07/27/2017 15:57     Results/Tests Pending at Time of Discharge: lower extremity venous dopplers  Discharge  Medications:  Allergies as of 07/30/2017   No Known Allergies     Medication List    STOP taking these medications   CARTIA XT 180 MG 24 hr capsule Generic drug:  diltiazem   lisinopril-hydrochlorothiazide 20-12.5 MG tablet Commonly known as:  PRINZIDE,ZESTORETIC   meloxicam 7.5 MG tablet Commonly known as:  MOBIC   potassium chloride SA 20 MEQ tablet Commonly known as:  K-DUR,KLOR-CON     TAKE these medications   acetaminophen 325 MG tablet Commonly known as:  TYLENOL Take 2 tablets (650 mg total) by mouth every 6 (six) hours as needed for mild pain (or Fever >/= 101).   aspirin 81 MG EC tablet Take 1 tablet (81 mg total) by mouth daily. Start taking on:  07/31/2017   atorvastatin 40 MG tablet Commonly known as:  LIPITOR Take 1 tablet (40 mg total) by mouth daily at 6 PM. What changed:    medication strength  how much to take  when to take this   clopidogrel 75 MG tablet Commonly known as:  PLAVIX Take 1 tablet (75 mg total) by mouth daily. Start taking on:  07/31/2017   enoxaparin 30 MG/0.3ML injection Commonly known as:  LOVENOX Inject 0.3 mLs (30 mg total) into the skin daily.   Melatonin 3 MG Tabs Take 1 tablet (3 mg total) by mouth at bedtime.       Discharge Instructions: Please refer to Patient Instructions section of EMR for full details.  Patient was counseled important signs and symptoms that should prompt return to medical care, changes in medications, dietary instructions, activity restrictions, and follow up appointments.   Follow-Up Appointments: Follow-up Information    Vanschaick,  Shanda Bumps, NP Follow up on 09/10/2017.   Specialty:  Nurse Practitioner Why:  at 3:15 p.m. Contact information: [address] [place] [address] [phone]           Lennox Solders, MD 07/30/2017, 4:05 PM PGY-1, [facility] Family [facility]

## 2017-07-30 NOTE — Progress Notes (Signed)
Inpatient Rehabilitation Admissions Coordinator  I met with patient and her spouse at bedside with RN, Ave Filter, native [place] interpreting. Both are in agreement to CIR admit today, but spouse concerned if he can stay with her. I reiterated that he could stay with her and I would arrange food for him with Nursing director. They are in agreement. I will make the arrangements to admit today.  Danne Baxter, RN, MSN Rehab Admissions Coordinator [phone] 07/30/2017 11:54 AM

## 2017-07-30 NOTE — Progress Notes (Addendum)
Inpatient Rehabilitation Admissions Coordinator  I await TEE and possible LOOP placement today for completion of medical work up. Once completed, we will arrange admission of patient to CIR today. I have notified Dr. Frances Furbish and Sim Boast, Patient's RN of plans. I will make arrangements and notify RN CM and SW.  Ottie Glazier, RN, MSN Rehab Admissions Coordinator [phone] 07/30/2017 10:12 AM

## 2017-07-30 NOTE — Progress Notes (Signed)
Report given to [facility] on [facility]. Patient will be transfer to 4W18 at this time. All belonging sent with patient.   Sim Boast, RN

## 2017-07-31 ENCOUNTER — Inpatient Hospital Stay (HOSPITAL_COMMUNITY): Payer: Medicaid Other | Admitting: Physical Therapy

## 2017-07-31 ENCOUNTER — Inpatient Hospital Stay (HOSPITAL_COMMUNITY): Payer: Medicaid Other | Admitting: Occupational Therapy

## 2017-07-31 ENCOUNTER — Encounter (HOSPITAL_COMMUNITY): Payer: Self-pay | Admitting: Cardiology

## 2017-07-31 DIAGNOSIS — G8191 Hemiplegia, unspecified affecting right dominant side: Secondary | ICD-10-CM

## 2017-07-31 DIAGNOSIS — I69398 Other sequelae of cerebral infarction: Secondary | ICD-10-CM

## 2017-07-31 DIAGNOSIS — R269 Unspecified abnormalities of gait and mobility: Secondary | ICD-10-CM

## 2017-07-31 DIAGNOSIS — I10 Essential (primary) hypertension: Secondary | ICD-10-CM

## 2017-07-31 DIAGNOSIS — I63432 Cerebral infarction due to embolism of left posterior cerebral artery: Secondary | ICD-10-CM

## 2017-07-31 LAB — CBC WITH DIFFERENTIAL/PLATELET
Abs Immature Granulocytes: 0 10*3/uL (ref 0.0–0.1)
BASOS ABS: 0 10*3/uL (ref 0.0–0.1)
Basophils Relative: 0 %
EOS ABS: 0.1 10*3/uL (ref 0.0–0.7)
EOS PCT: 1 %
HEMATOCRIT: 42.3 % (ref 36.0–46.0)
Hemoglobin: 13.8 g/dL (ref 12.0–15.0)
Immature Granulocytes: 0 %
Lymphocytes Relative: 20 %
Lymphs Abs: 1.2 10*3/uL (ref 0.7–4.0)
MCH: 25.7 pg — ABNORMAL LOW (ref 26.0–34.0)
MCHC: 32.6 g/dL (ref 30.0–36.0)
MCV: 78.6 fL (ref 78.0–100.0)
Monocytes Absolute: 0.6 10*3/uL (ref 0.1–1.0)
Monocytes Relative: 9 %
Neutro Abs: 4.1 10*3/uL (ref 1.7–7.7)
Neutrophils Relative %: 70 %
PLATELETS: 158 10*3/uL (ref 150–400)
RBC: 5.38 MIL/uL — AB (ref 3.87–5.11)
RDW: 15.6 % — AB (ref 11.5–15.5)
WBC: 5.9 10*3/uL (ref 4.0–10.5)

## 2017-07-31 LAB — COMPREHENSIVE METABOLIC PANEL
ALBUMIN: 3.4 g/dL — AB (ref 3.5–5.0)
ALT: 241 U/L — ABNORMAL HIGH (ref 14–54)
ANION GAP: 8 (ref 5–15)
AST: 293 U/L — ABNORMAL HIGH (ref 15–41)
Alkaline Phosphatase: 175 U/L — ABNORMAL HIGH (ref 38–126)
BILIRUBIN TOTAL: 1.3 mg/dL — AB (ref 0.3–1.2)
BUN: 19 mg/dL (ref 6–20)
CO2: 30 mmol/L (ref 22–32)
Calcium: 9.1 mg/dL (ref 8.9–10.3)
Chloride: 102 mmol/L (ref 101–111)
Creatinine, Ser: 0.84 mg/dL (ref 0.44–1.00)
GFR calc Af Amer: >60 mL/min (ref >60)
GFR calc non Af Amer: >60 mL/min (ref >60)
GLUCOSE: 108 mg/dL — AB (ref 65–99)
Potassium: 4.1 mmol/L (ref 3.5–5.1)
SODIUM: 140 mmol/L (ref 135–145)
TOTAL PROTEIN: 7.7 g/dL (ref 6.5–8.1)

## 2017-07-31 LAB — UREA NITROGEN, URINE: Urea Nitrogen, Ur: 1583 mg/dL

## 2017-07-31 MED ORDER — ATORVASTATIN CALCIUM 40 MG PO TABS: 40.0000 mg | ORAL_TABLET | Freq: Every day | ORAL | Status: DC

## 2017-07-31 MED ORDER — TROLAMINE SALICYLATE 10 % EX CREA: TOPICAL_CREAM | Freq: Three times a day (TID) | CUTANEOUS | Status: DC

## 2017-07-31 MED ORDER — MUSCLE RUB 10-15 % EX CREA
TOPICAL_CREAM | Freq: Three times a day (TID) | CUTANEOUS | Status: DC
  Administered 2017-07-31 – 2017-08-08 (×21): via TOPICAL
  Filled 2017-07-31: qty 85

## 2017-07-31 NOTE — Progress Notes (Addendum)
Patient continues to report being very tired and unable to sleep in the hospital. Will monitor--hope day of therapy will help to sleep better tonight. She is also reporting pain left calf/popliteal area. On further investigation reports is better with activity and that she used oil of calf at home. Likely MS in nature. No edema, tenderness or masses on exam. Will schedule aspercreme to help with local measures.    Abnormal LFTs noted--was normal 11/2014. Was on Lipitor PTA. Will hold for now. No GI symptoms. Will recheck labs on Monday.

## 2017-07-31 NOTE — Progress Notes (Signed)
Physical Therapy Assessment and Plan  Patient Details  Name: Erin Patterson MRN: [id number] Date of Birth: August 22, 1932  PT Diagnosis: Abnormality of gait, Cognitive deficits, Difficulty walking, Hemiplegia dominant and Muscle weakness Rehab Potential: Good ELOS: 10-12 days   Today's Date: 07/31/2017 PT Individual Time: 0900-1000 PT Individual Time Calculation (min): 60 min    Problem List:  Patient Active Problem List   Diagnosis Date Noted  . Embolic stroke ([place]) 17/79/3903  . Thrombocytopenia ([place])   . Essential hypertension   . Sleep disturbance   . Dyslipidemia   . H/O: stroke   . Intracranial vascular stenosis   . Ischemic cerebrovascular accident (CVA) ([place])   . Benign essential HTN   . AKI (acute kidney injury) ([place])   . Cerebrovascular accident (CVA) due to embolism of cerebral artery ([place])   . Hypophosphatemia   . Elevated brain natriuretic peptide (BNP) level   . Cardiomegaly   . Hypertensive urgency   . Leg cramp   . Dizziness 07/26/2017  . Weakness   . Hypokalemia   . Headache 12/10/2014  . Fever 12/10/2014  . Vision loss 12/10/2014  . HLD (hyperlipidemia) 12/10/2014  . MI (myocardial infarction) ([place]) 12/10/2014  . CAD (coronary artery disease) 12/10/2014    Past Medical History:  Past Medical History:  Diagnosis Date  . Hypercholesteremia   . Hypertension   . MI (myocardial infarction) ([place])   . Stroke (cerebrum) ([place])    years ago--episode of "bleeding from right ear and onset of weakness"    Past Surgical History: History reviewed. No pertinent surgical history.  Assessment & Plan Clinical Impression:  Erin Patterson is an 84 year old non-english speaking [place] female with history of HTN, CAD, CVA with mild residual R-HP who was admitted on 07/26/17 with HA, dizziness with  two episodes of near falls and hypertensive emergency with BP 190/94 at admission.  History taken from chart review. Patient was noted to be confused with reports of that  right-sided weakness was at baseline.  CT of head was negative.  CTA head/neck showed no emergent large vessel occlusion and moderate left M1 and mild right V4 stenosis. MRI of brain done revealing acute left thalamic infarct, subcentimeter acute infarct in posterior left and superior right cerebellum suggestive of embolic disease from heart or aorta.  2D echo done showing EF of 65 to 70% with no wall abnormality, mild left ear and severely calcified mitral valve. She was started on DAPT X 3 weeks with recommendations to continue ASA 81 mg daily after that period. Dr. Erlinda Hong felt "bilateral embolic stroke due to unknown source but suspicious for A fib."    TEE done today moderate calcifications on MV and AV with grade 3 plaque in descending thoracic aorta and arch and negative for PFO/ASD. Patient declined Loop recorder placement and will need 30 day event monitor after discharge.  BLE dopplers ordered and pending. Patient continues to be limited by right sided weakness and balance deficits.  Therapy ongoing and CIR recommended due to functional deficits.  Patient currently requires mod with mobility secondary to muscle weakness, decreased coordination and decreased problem solving, decreased safety awareness and decreased memory.  Prior to hospitalization, patient was independent  with mobility and lived with Spouse in a [place] home.  Home access is 4Stairs to enter.  Patient will benefit from skilled PT intervention to maximize safe functional mobility, minimize fall risk and decrease caregiver burden for planned discharge home with 24 hour supervision.  Anticipate patient  will benefit from follow up [place] at discharge.  PT - End of Session Activity Tolerance: Tolerates 10 - 20 min activity with multiple rests Endurance Deficit: Yes Endurance Deficit Description: fatigues quickly, needs frequent rest breaks PT Assessment Rehab Potential (ACUTE/IP ONLY): Good PT Barriers to Discharge: Decreased caregiver  support;Medical stability;Home environment access/layout PT Patient demonstrates impairments in the following area(s): Balance;Endurance;Motor;Safety PT Transfers Functional Problem(s): Bed Mobility;Bed to Chair;Car;Furniture;Floor PT Locomotion Functional Problem(s): Ambulation;Stairs PT Plan PT Intensity: Minimum of 1-2 x/day ,45 to 90 minutes PT Frequency: 5 out of 7 days PT Duration Estimated Length of Stay: 10-12 days PT Treatment/Interventions: Ambulation/gait training;Balance/vestibular training;Cognitive remediation/compensation;Community reintegration;Discharge planning;Disease management/prevention;DME/adaptive equipment instruction;Functional mobility training;Neuromuscular re-education;Patient/family education;Psychosocial support;Stair training;Therapeutic Activities;Therapeutic Exercise;UE/LE Strength taining/ROM;UE/LE Coordination activities PT Transfers Anticipated Outcome(s): Supervision PT Locomotion Anticipated Outcome(s): Supervision PT Recommendation Recommendations for Other Services: Speech consult Follow Up Recommendations: Home health PT Patient destination: Home Equipment Recommended: Rolling walker with 5" wheels  Skilled Therapeutic Intervention Evaluation completed (see details above and below) with education on PT POC and goals and individual treatment initiated with focus on functional transfers, gait training with RW, stair negotiation, and patient/family education on rehab expectations. Pt reports some muscle pain in LLE, RN and medical team aware and muscle cream prescribed. Pt initially requires mod A for squat pivot transfer bed to w/c due to decreased safety awareness and impulsivity. Ambulation x 60 ft with RW and and min A, decreased RLE clearance with onset of fatigue, decreased B step length and gait speed. Ascend/descend 4 stairs with 2 handrails and mod A for balance, step-to gait pattern. Manual w/c propulsion x 100 ft with BUE with min A. Pt left seated  in w/c in room with quick-release belt in place.  PT Evaluation Precautions/Restrictions Precautions Precautions: Fall Restrictions Weight Bearing Restrictions: No Home Living/Prior Functioning Home Living Living Arrangements: Spouse/significant other;Children Available Help at Discharge: Family Type of Home: Apartment Home Access: Stairs to enter Technical brewer of Steps: 4 Entrance Stairs-Rails: Right Home Layout: One level Bathroom Shower/Tub: Curtain;Walk-in shower Bathroom Toilet: Standard Bathroom Accessibility: Yes  Lives With: Spouse Prior Function Level of Independence: Independent with gait;Independent with transfers  Able to Take Stairs?: Yes Driving: No Comments: Watches [place] TV; husband provided supervision for meal prep Vision/Perception  Vision - History Baseline Vision: No visual deficits Perception Perception: Within Functional Limits Praxis Praxis: Intact  Cognition Overall Cognitive Status: History of cognitive impairments - at baseline(per husband memory deficits at baseline) Arousal/Alertness: Awake/alert Orientation Level: Oriented to person Attention: Sustained Sustained Attention: Impaired Sustained Attention Impairment: Verbal basic;Functional basic Memory: Impaired Memory Impairment: Decreased short term memory;Decreased recall of new information Decreased Short Term Memory: Verbal basic;Functional basic Awareness: Impaired Awareness Impairment: Emergent impairment Problem Solving: Impaired Problem Solving Impairment: Verbal basic;Functional basic Behaviors: Impulsive Safety/Judgment: Impaired Comments: Poor safety awareness with decreased awareness of deficits Sensation Sensation Light Touch: Appears Intact Hot/Cold: Appears Intact Proprioception: Appears Intact Coordination Gross Motor Movements are Fluid and Coordinated: No Fine Motor Movements are Fluid and Coordinated: No Coordination and Movement Description:  Generalized weakness with mild R hemiplegia Finger Nose Finger Test: Decreased speed and accuracy R UE Heel Shin Test: Decreased coordination with RLE 9 Hole Peg Test: R: 53 seconds L: 47 seconds Motor  Motor Motor: Hemiplegia Motor - Skilled Clinical Observations: Mild R hemiplegia  Mobility Bed Mobility Bed Mobility: Rolling Right;Rolling Left;Supine to Sit;Sit to Supine Rolling Right: Supervision/verbal cueing Rolling Left: Supervision/Verbal cueing Supine to Sit: Supervision/Verbal cueing Sit to Supine: Supervision/Verbal cueing Transfers Transfers: Stand  Pivot Transfers Sit to Stand: Minimal Assistance - Patient > 75%(to RW) Stand Pivot Transfers: Moderate Assistance - Patient 50 - 74% Stand Pivot Transfer Details: Verbal cues for sequencing;Verbal cues for technique;Verbal cues for precautions/safety;Manual facilitation for placement Stand Pivot Transfer Details (indicate cue type and reason): verbal and manual cues for safety due to pt impulsivity Locomotion  Gait Gait Pattern: Impaired Gait Pattern: Decreased step length - right;Decreased step length - left;Decreased stride length;Right flexed knee in stance;Decreased dorsiflexion - right Knee - Stance Phase - Impaired Gait Pattern: Increased knee flexion - Right Gait velocity: decreased Stairs / Additional Locomotion Stairs: Yes Stairs Assistance: Moderate Assistance - Patient 50 - 74% Stair Management Technique: Two rails;Step to pattern Number of Stairs: 4 Height of Stairs: 6 Wheelchair Mobility Wheelchair Mobility: Yes Wheelchair Propulsion: Both upper extremities Wheelchair Parts Management: Needs assistance Distance: 50 ft with min A  Trunk/Postural Assessment  Cervical Assessment Cervical Assessment: Within Functional Limits Thoracic Assessment Thoracic Assessment: Within Functional Limits Lumbar Assessment Lumbar Assessment: Within Functional Limits Postural Control Postural Control: Within Functional  Limits  Balance Balance Balance Assessed: Yes Standardized Balance Assessment Standardized Balance Assessment: Berg Balance Test Berg Balance Test Sit to Stand: Needs minimal aid to stand or to stabilize Standing Unsupported: Able to stand 2 minutes with supervision Sitting with Back Unsupported but Feet Supported on Floor or Stool: Able to sit 2 minutes under supervision Stand to Sit: Uses backs of legs against chair to control descent Transfers: Needs one person to assist Standing Unsupported with Eyes Closed: Able to stand 10 seconds with supervision Standing Ubsupported with Feet Together: Needs help to attain position and unable to hold for 15 seconds From Standing, Reach Forward with Outstretched Arm: Loses balance while trying/requires external support From Standing Position, Pick up Object from Floor: Unable to try/needs assist to keep balance From Standing Position, Turn to Look Behind Over each Shoulder: Needs supervision when turning Turn 360 Degrees: Needs assistance while turning Standing Unsupported, Alternately Place Feet on Step/Stool: Able to complete >2 steps/needs minimal assist Standing Unsupported, One Foot in Front: Loses balance while stepping or standing Standing on One Leg: Unable to try or needs assist to prevent fall Total Score: 15 Dynamic Sitting Balance Dynamic Sitting - Balance Support: During functional activity;Feet supported Dynamic Sitting - Level of Assistance: 5: Stand by assistance Sitting balance - Comments: Sitting on toilet to dress Static Standing Balance Static Standing - Balance Support: During functional activity;No upper extremity supported Static Standing - Level of Assistance: 4: Min assist Static Standing - Comment/# of Minutes: Standing at sink to complete grooming tasks Dynamic Standing Balance Dynamic Standing - Balance Support: During functional activity;No upper extremity supported Dynamic Standing - Level of Assistance: 4: Min  assist;3: Mod assist Dynamic Standing - Comments: Standing during LB clothing management Extremity Assessment  RUE Assessment RUE Assessment: Exceptions to [facility] Active Range of Motion (AROM) Comments: ~120 degrees shoulder flexion, all other joints WNL General Strength Comments: 3+/5 thorughout, full finger opposition with decreased gross grip strength RUE Body System: Neuro LUE Assessment LUE Assessment: Within Functional Limits General Strength Comments: 4/5 throughout RLE Assessment RLE Assessment: Exceptions to [facility] Passive Range of Motion (PROM) Comments: WFL Active Range of Motion (AROM) Comments: WFL General Strength Comments: 3/5 hip flex, 4/5 knee and ankle LLE Assessment LLE Assessment: Exceptions to [facility] Passive Range of Motion (PROM) Comments: WFL Active Range of Motion (AROM) Comments: WFL General Strength Comments: 3/5 hip flex, 4/5 knee and ankle   See Function Navigator  for Current Functional Status.   Refer to Care Plan for Long Term Goals  Recommendations for other services: Other: Speech Therapy consult  Discharge Criteria: Patient will be discharged from PT if patient refuses treatment 3 consecutive times without medical reason, if treatment goals not met, if there is a change in medical status, if patient makes no progress towards goals or if patient is discharged from hospital.  The above assessment, treatment plan, treatment alternatives and goals were discussed and mutually agreed upon: by patient and by family  Excell Seltzer, PT, DPT  07/31/2017, 4:15 PM

## 2017-07-31 NOTE — Progress Notes (Signed)
Social Work  Social Work Assessment and Plan  Patient Details  Name: Erin Patterson MRN: [id number] Date of Birth: 25-Jul-1932  Today's Date: 07/31/2017  Problem List:  Patient Active Problem List   Diagnosis Date Noted  . Embolic stroke ([facility]) 07/30/2017  . Thrombocytopenia ([facility])   . Essential hypertension   . Sleep disturbance   . Dyslipidemia   . H/O: stroke   . Intracranial vascular stenosis   . Ischemic cerebrovascular accident (CVA) ([facility])   . Benign essential HTN   . AKI (acute kidney injury) ([facility])   . Cerebrovascular accident (CVA) due to embolism of cerebral artery ([facility])   . Hypophosphatemia   . Elevated brain natriuretic peptide (BNP) level   . Cardiomegaly   . Hypertensive urgency   . Leg cramp   . Dizziness 07/26/2017  . Weakness   . Hypokalemia   . Headache 12/10/2014  . Fever 12/10/2014  . Vision loss 12/10/2014  . HLD (hyperlipidemia) 12/10/2014  . MI (myocardial infarction) ([facility]) 12/10/2014  . CAD (coronary artery disease) 12/10/2014   Past Medical History:  Past Medical History:  Diagnosis Date  . Hypercholesteremia   . Hypertension   . MI (myocardial infarction) ([facility])   . Stroke (cerebrum) ([facility])    years ago--episode of "bleeding from right ear and onset of weakness"    Past Surgical History: History reviewed. No pertinent surgical history. Social History:  reports that she has never smoked. She has never used smokeless tobacco. She reports that she does not drink alcohol or use drugs.  Family / Support Systems Marital Status: Married Patient Roles: Spouse, Parent Spouse/Significant Other: Plan-husband [phone]-home Children: Daughter attempting to get her phone number-not in chart and pt and husband could not give to this worker Other Supports: family members in [place] Anticipated Caregiver: Husband-staying here with pt Ability/Limitations of Caregiver: Husband has a amputated left arm but can provide supervision level Caregiver Availability:  24/7 Family Dynamics: Close with their daughter whom they live with and their family back in [place]. Daughter works all of the time-9a-9p and is not home much. She is visiting at night before going home from work.  Social History Preferred language: [place] Religion:  Cultural Background: from [place] been here 10 years. Does not speak English will need interpreter while here Education: No schooling Read: No Write: No Employment Status: Retired Fish farm manager Issues: No issues Guardian/Conservator: None-according to MD pt is capable of making her own decisions while here, husband is staying here with her.   Abuse/Neglect Abuse/Neglect Assessment Can Be Completed: Yes Physical Abuse: Denies Verbal Abuse: Denies Sexual Abuse: Denies Exploitation of patient/patient's resources: Denies Self-Neglect: Denies  Emotional Status Pt's affect, behavior adn adjustment status: Pt looks toward husband to answer questions, both seem to be very appreciative of any assist and glad to be here. Pt was mod/i prior to admission even after stroke 15 years ago. But husband reports she had some weakness on her right side. Difficult to know how weak pt was PTA. Recent Psychosocial Issues: Hx CVA 15 years ago, doesn't speak English or read or write in [place] Pyschiatric History: No history deferred depression screen due to pt adjusting the the new unit and still processing all that has happened. Will see who she does and see if would benefit from seeing neuro-psych while here Substance Abuse History: No issues  Patient / Family Perceptions, Expectations & Goals Pt/Family understanding of illness & functional limitations: Pt and husband seem to have a basic understanding of her  stroke and deficits, husband is very attentive of pt and her needs. Will make sure the interpreter is here with MD to answer any of their questions.  Premorbid pt/family roles/activities: Wife, Mother, retiree,  etc Anticipated changes in roles/activities/participation: resume Pt/family expectations/goals: Pt states: " I hope to do well here."  Husband states: " We will help her we have in the past."  [employer]: None Premorbid Home Care/DME Agencies: Other (Comment)(RW) Transportation available at discharge: Daughter Resource referrals recommended: Support group (specify)  Discharge Planning Living Arrangements: Spouse/significant other, Children Support Systems: Spouse/significant other, Children Type of Residence: Private residence Insurance Resources: [employer] (specify county) Architect: Restaurant manager, fast food Screen Referred: No Living Expenses: Psychologist, sport and exercise Management: Family Does the patient have any problems obtaining your medications?: No Home Management: All of them Patient/Family Preliminary Plans: Return home with daughter and husband, husband is staying with pt while here. Will make sure has an interpreter while here for communication. Should do well she is moving well already.  Sw Barriers to Discharge: Other (comments) Sw Barriers to Discharge Comments: language barier Social Work Anticipated Follow Up Needs: HH/OP  Clinical Impression Pleasant couple who are very gracious for pt's care and just being here on rehab. Will try to touch base with their daughter to see if questions or concerns. Will educate husband while here and work on any discharge needs. See if would benefit from seeing neuro-psych while here.  Lucy Chris 07/31/2017, 3:03 PM

## 2017-07-31 NOTE — Progress Notes (Signed)
Subjective/Complaints:   Objective: Vital Signs: Blood pressure (!) 143/87, pulse (!) 104, temperature 98.4 F (36.9 C), temperature source Oral, resp. rate 18, height 5' 4"  (1.626 m), weight 51.3 kg (113 lb), SpO2 100 %. No results found. Results for orders placed or performed during the hospital encounter of 07/26/17 (from the past 72 hour(s))  Basic metabolic panel     Status: Abnormal   Collection Time: 07/29/17  7:29 AM  Result Value Ref Range   Sodium 139 135 - 145 mmol/L   Potassium 3.9 3.5 - 5.1 mmol/L   Chloride 105 101 - 111 mmol/L   CO2 25 22 - 32 mmol/L   Glucose, Bld 97 65 - 99 mg/dL   BUN 29 (H) 6 - 20 mg/dL   Creatinine, Ser 0.81 0.44 - 1.00 mg/dL   Calcium 8.9 8.9 - 10.3 mg/dL   GFR calc non Af Amer >60 >60 mL/min   GFR calc Af Amer >60 >60 mL/min    Comment: (NOTE) The eGFR has been calculated using the CKD EPI equation. This calculation has not been validated in all clinical situations. eGFR's persistently <60 mL/min signify possible Chronic Kidney Disease.    Anion gap 9 5 - 15    Comment: Performed at [facility] [address] [place], [place] [address]  Basic metabolic panel     Status: Abnormal   Collection Time: 07/30/17  7:26 AM  Result Value Ref Range   Sodium 141 135 - 145 mmol/L   Potassium 4.6 3.5 - 5.1 mmol/L   Chloride 107 101 - 111 mmol/L   CO2 24 22 - 32 mmol/L   Glucose, Bld 93 65 - 99 mg/dL   BUN 26 (H) 6 - 20 mg/dL   Creatinine, Ser 0.74 0.44 - 1.00 mg/dL   Calcium 8.9 8.9 - 10.3 mg/dL   GFR calc non Af Amer >60 >60 mL/min   GFR calc Af Amer >60 >60 mL/min    Comment: (NOTE) The eGFR has been calculated using the CKD EPI equation. This calculation has not been validated in all clinical situations. eGFR's persistently <60 mL/min signify possible Chronic Kidney Disease.    Anion gap 10 5 - 15    Comment: Performed at [facility] [address], [place], [place] [address]  CBC     Status: Abnormal   Collection  Time: 07/30/17  7:26 AM  Result Value Ref Range   WBC 5.6 4.0 - 10.5 K/uL   RBC 5.04 3.87 - 5.11 MIL/uL   Hemoglobin 13.0 12.0 - 15.0 g/dL   HCT 39.4 36.0 - 46.0 %   MCV 78.2 78.0 - 100.0 fL   MCH 25.8 (L) 26.0 - 34.0 pg   MCHC 33.0 30.0 - 36.0 g/dL   RDW 15.9 (H) 11.5 - 15.5 %   Platelets 131 (L) 150 - 400 K/uL    Comment: Performed at [facility], [place] [address] [place], [place] [address]     HEENT: normal Cardio: RRR and no murmur Resp: CTA B/L and unlabored GI: BS positive and NT, ND Extremity:  No Edema Skin:   Intact Neuro: Other 4/5 on RIght side  HF, KE, ADF, Delt, Bi, Tri,Grip, intact bil finger to thumb Musc/Skel:  Normal and Other no pain with UE or LE ROM Gen NAD   Assessment/Plan: 1. Functional deficits secondary to right hemiparesis From Left thalamic infarct which require 3+ hours per day of interdisciplinary therapy in a comprehensive inpatient rehab setting.  Physiatrist is providing close team supervision and 24 hour management of active medical problems listed below. Physiatrist and rehab team continue to assess barriers to discharge/monitor patient progress toward functional and medical goals. FIM:       Function - Toileting Toileting steps completed by patient: Adjust clothing prior to toileting, Performs perineal hygiene, Adjust clothing after toileting Toileting Assistive Devices: Grab bar or rail Assist level: Touching or steadying assistance (Pt.75%)  Function - Air cabin crew transfer assistive device: Walker Assist level to toilet: Touching or steadying assistance (Pt > 75%) Assist level from toilet: Touching or steadying assistance (Pt > 75%)                      Medical Problem List and Plan: 1.  Right sided weakness and balance deficits  secondary to bilateral CVA, main fxnl def is from R thalamic infarct CIR Evals today 2.  DVT Prophylaxis/Anticoagulation: Pharmaceutical: Lovenox 3. Pain Management: Off  Meloxicam. Right trap pain has resolved.  Would avoid NSAIDs 4. Mood: LCSW to follow up for evaluation and support.  5. Neuropsych: This patient is not capable of making decisions on herown behalf. 6. Skin/Wound Care: Routine pressure relief measures.  7. Fluids/Electrolytes/Nutrition: Monitor I/O. Check lytes in am.  8. Dyslipidemia: Now on Lipitor.  9. Sleep disturbance: Continue melatonin.  10. HTN: Monitor BP bid with permissive HTN for 3-5 days--currently labile and may need prinzide resumed.  11. Thrombocytopenia: Monitor CBC serial and  for signs of bleeding.     LOS (Days) 1 A FACE TO FACE EVALUATION WAS PERFORMED  Charlett Blake 07/31/2017, 7:56 AM

## 2017-07-31 NOTE — Evaluation (Signed)
Occupational Therapy Assessment and Plan  Patient Details  Name: Erin Patterson MRN: [id number] Date of Birth: 10-31-1932  OT Diagnosis: cognitive deficits, hemiplegia affecting dominant side and muscle weakness (generalized) Rehab Potential: Rehab Potential (ACUTE ONLY): Good ELOS: 10-12 days   Today's Date: 07/31/2017 OT Individual Time: 1000-1100 OT Individual Time Calculation (min): 60 min     Problem List:  Patient Active Problem List   Diagnosis Date Noted  . Embolic stroke ([place]) 80/16/5537  . Thrombocytopenia ([place])   . Essential hypertension   . Sleep disturbance   . Dyslipidemia   . H/O: stroke   . Intracranial vascular stenosis   . Ischemic cerebrovascular accident (CVA) ([place])   . Benign essential HTN   . AKI (acute kidney injury) ([place])   . Cerebrovascular accident (CVA) due to embolism of cerebral artery ([place])   . Hypophosphatemia   . Elevated brain natriuretic peptide (BNP) level   . Cardiomegaly   . Hypertensive urgency   . Leg cramp   . Dizziness 07/26/2017  . Weakness   . Hypokalemia   . Headache 12/10/2014  . Fever 12/10/2014  . Vision loss 12/10/2014  . HLD (hyperlipidemia) 12/10/2014  . MI (myocardial infarction) ([place]) 12/10/2014  . CAD (coronary artery disease) 12/10/2014    Past Medical History:  Past Medical History:  Diagnosis Date  . Hypercholesteremia   . Hypertension   . MI (myocardial infarction) ([place])   . Stroke (cerebrum) ([place])    years ago--episode of "bleeding from right ear and onset of weakness"    Past Surgical History: History reviewed. No pertinent surgical history.  Assessment & Plan Clinical Impression: Unong Tiann Saha is an 84 year old non-english speaking [place] female with history of HTN, CAD, CVA with mild residual R-HP who was admitted on 07/26/17 with HA, dizziness with  two episodes of near falls and hypertensive emergency with BP 190/94 at admission.  History taken from chart review. Patient was noted to be confused  with reports of that right-sided weakness was at baseline.  CT of head was negative.  CTA head/neck showed no emergent large vessel occlusion and moderate left M1 and mild right V4 stenosis. MRI of brain done revealing acute left thalamic infarct, subcentimeter acute infarct in posterior left and superior right cerebellum suggestive of embolic disease from heart or aorta.  2D echo done showing EF of 65 to 70% with no wall abnormality, mild left ear and severely calcified mitral valve. She was started on DAPT X 3 weeks with recommendations to continue ASA 81 mg daily after that period. Dr. Erlinda Hong felt "bilateral embolic stroke due to unknown source but suspicious for A fib."    TEE done today moderate calcifications on MV and AV with grade 3 plaque in descending thoracic aorta and arch and negative for PFO/ASD. Patient declined Loop recorder placement and will need 30 day event monitor after discharge.  BLE dopplers ordered and pending. Patient continues to be limited by right sided weakness and balance deficits.  Therapy ongoing and CIR recommended due to functional deficits.  Patient transferred to CIR on 82/06/2017 .    Patient currently requires min with basic self-care skills secondary to muscle weakness, decreased cardiorespiratoy endurance, decreased coordination and decreased sitting balance, decreased standing balance, decreased postural control, hemiplegia and decreased balance strategies.  Prior to hospitalization, patient could complete ADLs independently and supervision for IADLs.  Patient will benefit from skilled intervention to decrease level of assist with basic self-care skills and increase independence with basic  self-care skills prior to discharge home with care partner.  Anticipate patient will require 24 hour supervision and follow up home health.  OT - End of Session Activity Tolerance: Tolerates 10 - 20 min activity with multiple rests Endurance Deficit: Yes OT Assessment Rehab  Potential (ACUTE ONLY): Good OT Patient demonstrates impairments in the following area(s): Balance;Cognition;Safety;Endurance;Motor OT Basic ADL's Functional Problem(s): Grooming;Bathing;Dressing;Toileting OT Transfers Functional Problem(s): Toilet;Tub/Shower OT Additional Impairment(s): Fuctional Use of Upper Extremity OT Plan OT Intensity: Minimum of 1-2 x/day, 45 to 90 minutes OT Frequency: 5 out of 7 days OT Duration/Estimated Length of Stay: 10-12 days OT Treatment/Interventions: Balance/vestibular training;Discharge planning;Functional electrical stimulation;Pain management;Self Care/advanced ADL retraining;Therapeutic Activities;UE/LE Coordination activities;Visual/perceptual remediation/compensation;Therapeutic Exercise;Patient/family education;Functional mobility training;Disease mangement/prevention;Cognitive remediation/compensation;Community reintegration;DME/adaptive equipment instruction;Neuromuscular re-education;Psychosocial support;UE/LE Strength taining/ROM OT Self Feeding Anticipated Outcome(s): Mod I OT Basic Self-Care Anticipated Outcome(s): Supervision OT Toileting Anticipated Outcome(s): Supervision OT Bathroom Transfers Anticipated Outcome(s): Supervision OT Recommendation Patient destination: Home Follow Up Recommendations: Home health OT Equipment Recommended: To be determined   Skilled Therapeutic Intervention Pt seen for OT eval and ADL bathing/dressing session. Pt sitting up in w/c upon arrival with husband and interpreter present. Pt agreeable to tx session and denying pain. She ambulated throughout session with RW, requiring max cuing for assist for safe and proper management of RW in functional context.  She bathed seated on tub transfer bench in shower, able to use B UEs simultaneously to wash hair and extremities. She required VCs for sequencing of task, beginning to bathe without wetting wash or using soap.  She returned to toilet and dressed seated on  toilet, standing with steadying assist to pull pants up. Following seated rest break, she completed grooming tasks standing at sink with CGA. Returned to w/c, completed 9 hole peg test, see results below. Pt left seated in w/c at end of session, QRB donned and all needs in reach. Education provided throughout session regarding role of OT, POC, OT/PT goals, and d/c planning.   OT Evaluation Precautions/Restrictions  Precautions Precautions: Fall Restrictions Weight Bearing Restrictions: No General Chart Reviewed: Yes Additional Pertinent History: hx of CVA with R residual weakness Pain Pain Assessment Pain Score: 0-No pain Home Living/Prior Functioning Home Living Family/patient expects to be discharged to:: Private residence Living Arrangements: Spouse/significant other, Children Available Help at Discharge: Family Type of Home: Apartment Home Access: Stairs to enter Technical brewer of Steps: 4 Entrance Stairs-Rails: Right Home Layout: One level Bathroom Shower/Tub: Curtain, Multimedia programmer: Programmer, systems: Yes  Lives With: Spouse, Daughter IADL History Homemaking Responsibilities: Yes Current License: No Prior Function Level of Independence: Independent with basic ADLs, Independent with gait, Needs assistance with homemaking  Able to Take Stairs?: Yes Driving: No Comments: Watches [place] TV; husband provided supervision for meal prep Vision Baseline Vision/History: No visual deficits Patient Visual Report: No change from baseline Vision Assessment?: No apparent visual deficits Perception  Perception: Within Functional Limits Praxis Praxis: Intact Cognition Overall Cognitive Status: History of cognitive impairments - at baseline(Pt's husband reports memory deficits since first CVA) Arousal/Alertness: Awake/alert Orientation Level: Person;Place;Situation Person: Oriented Place: Oriented Situation: Oriented Year: Other  (Comment)(Would not give guess to year) Month: (Would not give guess to month) Day of Week: Other (Comment)(Would not give guess to day of week) Memory: Impaired Memory Impairment: Decreased short term memory;Decreased recall of new information Decreased Short Term Memory: Verbal basic;Functional basic Immediate Memory Recall: Sock;Blue;Bed Attention: Sustained Sustained Attention: Impaired Sustained Attention Impairment: Verbal basic;Functional basic Awareness: Impaired Awareness Impairment: Emergent impairment Problem  Solving: Impaired Problem Solving Impairment: Verbal basic;Functional basic Behaviors: Impulsive Safety/Judgment: Impaired Comments: Poor safety awareness with decreased awareness of deficits Sensation Sensation Light Touch: Appears Intact Hot/Cold: Appears Intact Coordination Gross Motor Movements are Fluid and Coordinated: No Fine Motor Movements are Fluid and Coordinated: No Coordination and Movement Description: Generalized weakness with mild R hemiplegia Finger Nose Finger Test: Decreased speed and accuracy R UE 9 Hole Peg Test: R: 53 seconds L: 47 seconds Motor  Motor Motor: Hemiplegia Motor - Skilled Clinical Observations: Mild R hemiplegia Trunk/Postural Assessment  Cervical Assessment Cervical Assessment: Within Functional Limits Thoracic Assessment Thoracic Assessment: Within Functional Limits Lumbar Assessment Lumbar Assessment: Within Functional Limits Postural Control Postural Control: Within Functional Limits  Balance Balance Balance Assessed: Yes Dynamic Sitting Balance Dynamic Sitting - Balance Support: During functional activity;Feet supported Dynamic Sitting - Level of Assistance: 5: Stand by assistance Sitting balance - Comments: Sitting on toilet to dress Static Standing Balance Static Standing - Balance Support: During functional activity;No upper extremity supported Static Standing - Level of Assistance: 5: Stand by assistance;4:  Min assist Static Standing - Comment/# of Minutes: Standing at sink to complete grooming tasks Dynamic Standing Balance Dynamic Standing - Balance Support: During functional activity;No upper extremity supported Dynamic Standing - Level of Assistance: 4: Min assist;3: Mod assist Dynamic Standing - Comments: Standing during LB clothing management Extremity/Trunk Assessment RUE Assessment RUE Assessment: Exceptions to [facility] Active Range of Motion (AROM) Comments: ~120 degrees shoulder flexion, all other joints WNL General Strength Comments: 3+/5 thorughout, full finger opposition with decreased gross grip strength RUE Body System: Neuro LUE Assessment LUE Assessment: Within Functional Limits General Strength Comments: 4/5 throughout   See Function Navigator for Current Functional Status.   Refer to Care Plan for Long Term Goals  Recommendations for other services: None    Discharge Criteria: Patient will be discharged from OT if patient refuses treatment 3 consecutive times without medical reason, if treatment goals not met, if there is a change in medical status, if patient makes no progress towards goals or if patient is discharged from hospital.  The above assessment, treatment plan, treatment alternatives and goals were discussed and mutually agreed upon: by patient and by family  Moroni Nester L 07/31/2017, 12:46 PM

## 2017-07-31 NOTE — Progress Notes (Signed)
Physical Therapy Session Note  Patient Details  Name: Erin Patterson MRN: [id number] Date of Birth: 06/14/1932  Today's Date: 07/31/2017 PT Individual Time: 1300-1415 PT Individual Time Calculation (min): 75 min   Short Term Goals: Week 1:  PT Short Term Goal 1 (Week 1): Pt will complete transfers with min A consistently PT Short Term Goal 2 (Week 1): Pt will ambulate x 150 ft with RW and min A PT Short Term Goal 3 (Week 1): Pt will ascend/descend 4 stairs with 2 handrails and min A  Skilled Therapeutic Interventions/Progress Updates:  Pt received supine in bed, agreeable to PT. No complaints of pain. Stand pivot transfer bed to w/c with min A, v/c for safety. Car transfer with min A with max v/c for safety due to decreased awareness of surroundings and unsafe RW use. Ambulation 2 x 60 ft with RW and min A, focus on safe RW management with gait and increasing RLE clearance.  Berg Balance Test 15/56. Standing mini-squats 2 x 10 reps with focus on R knee ext/flexion. Sit stand with 1" step under LLE to encourage weight shift to R in standing. Pt left supine in bed with needs in reach and bed alarm in place. Interpreter present during therapy session.  Therapy Documentation Precautions:  Precautions Precautions: Fall Restrictions Weight Bearing Restrictions: No  See Function Navigator for Current Functional Status.   Therapy/Group: Individual Therapy  Peter Congo, PT, DPT  07/31/2017, 4:29 PM

## 2017-07-31 NOTE — Care Management Note (Signed)
[facility] Individual Statement of Services  Patient Name:  Erin Patterson  Date:  07/31/2017  Welcome to the [facility].  Our goal is to provide you with an individualized program based on your diagnosis and situation, designed to meet your specific needs.  With this comprehensive rehabilitation program, you will be expected to participate in at least 3 hours of rehabilitation therapies Monday-Friday, with modified therapy programming on the weekends.  Your rehabilitation program will include the following services:  Physical Therapy (PT), Occupational Therapy (OT), Speech Therapy (ST), 24 hour per day rehabilitation nursing, Case Management (Social Worker), Rehabilitation Medicine, Nutrition Services and Pharmacy Services  Weekly team conferences will be held on Wednesday to discuss your progress.  Your Social Worker will talk with you frequently to get your input and to update you on team discussions.  Team conferences with you and your family in attendance may also be held.  Expected length of stay: 10-12 days Overall anticipated outcome: supervision with cueing  Depending on your progress and recovery, your program may change. Your Social Worker will coordinate services and will keep you informed of any changes. Your Social Worker's name and contact numbers are listed  below.  The following services may also be recommended but are not provided by the [facility]:    Home Health Rehabiltiation Services  Outpatient Rehabilitation Services    Arrangements will be made to provide these services after discharge if needed.  Arrangements include referral to agencies that provide these services.  Your insurance has been verified to be:  medicaid Your primary doctor is:  Amada Kingfisher Bousu  Pertinent information will be shared with your doctor and your insurance company.  Social Worker:  Dossie Der, SW [phone] or ([phone]  Information discussed with and copy given to patient by: Lucy Chris, 07/31/2017, 11:21 AM

## 2017-08-01 ENCOUNTER — Inpatient Hospital Stay (HOSPITAL_COMMUNITY): Payer: Medicaid Other

## 2017-08-01 ENCOUNTER — Inpatient Hospital Stay (HOSPITAL_COMMUNITY): Payer: Medicaid Other | Admitting: Speech Pathology

## 2017-08-01 ENCOUNTER — Inpatient Hospital Stay (HOSPITAL_COMMUNITY): Payer: Medicaid Other | Admitting: Occupational Therapy

## 2017-08-01 MED ORDER — LISINOPRIL 2.5 MG PO TABS
2.5000 mg | ORAL_TABLET | Freq: Every day | ORAL | Status: DC
  Administered 2017-08-01 – 2017-08-04 (×4): 2.5 mg via ORAL
  Filled 2017-08-01 (×4): qty 1

## 2017-08-01 NOTE — Progress Notes (Addendum)
Pt c/o being dizzy and tired.  Had just returned from therapy and laid back down.  Called interpreter.  VS in chart, informed pt that orthostatic and had probably moved too quickly from w/c to bed.  Informed would notify PA that is systematic.Notified PA of changes, new orders rec'd. Will continue to monitor.

## 2017-08-01 NOTE — Progress Notes (Signed)
Occupational Therapy Session Note  Patient Details  Name: Erin Patterson MRN: [id number] Date of Birth: [birth year]  Today's Date: 08/01/2017 OT Individual Time: 0945-1100 OT Individual Time Calculation (min): 75 min    Short Term Goals: Week 1:  OT Short Term Goal 1 (Week 1): Pt will correctly place hands on RW during sit<> stand mod I OT Short Term Goal 2 (Week 1): Pt will use R UE at non-dominant level mod I OT Short Term Goal 3 (Week 1): Pt will manage RW in functional context during ADL session with no more than 2 VCs  Skilled Therapeutic Interventions/Progress Updates:    Pt seen for OT ADL bathing/dressing session and for LE strengthening exercises. Pt sitting up in w/c upon arrival with hand off from PT. Pt agreeable to tx session and denying pain.  She ambulated throughout session with HHA, VCs for safety awareness due to impulsivity.  She bathed seated on tub transfer bench, required VCs for sequencing of task set-up. She dressed seated on toilet, standing without UE support and steadying assist while pt pulled pants up. She completed grooming tasks standing at sink with steadying assist in order to improve functional standing balance and endurance, tolerating ~2 minutes in standing before requiring seated rest break. She ambulated to therapy gym with HHA, one seated rets break required. In gym, completed x2 sets of 10 glute bridges with multimodal cuing for proper form and technique. x2 sets of 10 hip ABduction "clam shells" in R side-lying, unable to perform on R and therefore completed x2 sets of 10 ball squeezes btwn legs from L side-lying. Rest breaks provided throughout. PT returned to room in same manner as described above, left seated in w/c with chair alarm and QRB donned. Reviewed with pt and husband her need for hospital staff assist with all mobility- they voiced understanding.  Education provided throughout session regarding role of OT, POC, and d/c planning.   Therapy  Documentation Precautions:  Precautions Precautions: Fall Restrictions Weight Bearing Restrictions: No Pain:   No/denies pain  See Function Navigator for Current Functional Status.   Therapy/Group: Individual Therapy  Braniya Farrugia L 08/01/2017, 6:46 AM

## 2017-08-01 NOTE — Progress Notes (Signed)
Physical Therapy Session Note  Patient Details  Name: Erin Patterson MRN: [id number] Date of Birth: 27-Mar-1932  Today's Date: 08/01/2017 PT Individual Time: 2202-5427 PT Individual Time Calculation (min): 60 min   Short Term Goals: Week 1:  PT Short Term Goal 1 (Week 1): Pt will complete transfers with min A consistently PT Short Term Goal 2 (Week 1): Pt will ambulate x 150 ft with RW and min A PT Short Term Goal 3 (Week 1): Pt will ascend/descend 4 stairs with 2 handrails and min A  Skilled Therapeutic Interventions/Progress Updates:    Functional bed mobility with supervision and came to EOB to don shoes to prepare for therapy session. Required some assist with stabilizing shoe while trying to push foot into shoe. Min assist for stand pivot transfers and ambulatory transfers during session with cues for safe hand placement and support for balance. NMR to address RLE motor control/strength, functional balance, balance re-training, and weightbearing through RLE during alternating toe taps to cone (min up to mod assist needed to stabilize when in single limb stance on R), dynamic gait without AD  through cones for turning and home environment mobility and progressed to gait through cones with toe tap on R and then L (min assist for taps with RLE but mod assist for LLE taps) and verbal cues for upright posture and knee extension on R in stance, functional reaching task while on compliant surface and the biodex for limits of stability activity with BUE support on non compliant (5 min to complete) and compliant surface (10 min to complete) with score < 10% but demonstrates very slow and controlled movement patterns. Recommending and educated pt on continued practice without AD to challenge balance and progress motor recovery. Pt verbalized understanding and in agreement. Pt does demonstrate some impulsive behaviors at times requiring cues for safety.   Medical interpreter present.  Therapy  Documentation Precautions:  Precautions Precautions: Fall Restrictions Weight Bearing Restrictions: No Pain: Denies pain, just c/o weakness in RLE   See Function Navigator for Current Functional Status.   Therapy/Group: Individual Therapy  Karolee Stamps Darrol Poke, PT, DPT  08/01/2017, 11:20 AM

## 2017-08-01 NOTE — Progress Notes (Signed)
Subjective/Complaints: Non English speaking, daughter can translate somewhat ROS- difficult to obtain Objective: Vital Signs: Blood pressure 120/79, pulse (!) 102, temperature 98.3 F (36.8 C), temperature source Oral, resp. rate 18, height 5' 4"  (1.626 m), weight 52.6 kg (115 lb 15.4 oz), SpO2 98 %. No results found. Results for orders placed or performed during the hospital encounter of 07/30/17 (from the past 72 hour(s))  CBC WITH DIFFERENTIAL     Status: Abnormal   Collection Time: 07/31/17  7:13 AM  Result Value Ref Range   WBC 5.9 4.0 - 10.5 K/uL   RBC 5.38 (H) 3.87 - 5.11 MIL/uL   Hemoglobin 13.8 12.0 - 15.0 g/dL   HCT 42.3 36.0 - 46.0 %   MCV 78.6 78.0 - 100.0 fL   MCH 25.7 (L) 26.0 - 34.0 pg   MCHC 32.6 30.0 - 36.0 g/dL   RDW 15.6 (H) 11.5 - 15.5 %   Platelets 158 150 - 400 K/uL   Neutrophils Relative % 70 %   Neutro Abs 4.1 1.7 - 7.7 K/uL   Lymphocytes Relative 20 %   Lymphs Abs 1.2 0.7 - 4.0 K/uL   Monocytes Relative 9 %   Monocytes Absolute 0.6 0.1 - 1.0 K/uL   Eosinophils Relative 1 %   Eosinophils Absolute 0.1 0.0 - 0.7 K/uL   Basophils Relative 0 %   Basophils Absolute 0.0 0.0 - 0.1 K/uL   Immature Granulocytes 0 %   Abs Immature Granulocytes 0.0 0.0 - 0.1 K/uL    Comment: Performed at [facility], [address] [place], [place] [address]  Comprehensive metabolic panel     Status: Abnormal   Collection Time: 07/31/17  7:13 AM  Result Value Ref Range   Sodium 140 135 - 145 mmol/L   Potassium 4.1 3.5 - 5.1 mmol/L   Chloride 102 101 - 111 mmol/L   CO2 30 22 - 32 mmol/L   Glucose, Bld 108 (H) 65 - 99 mg/dL   BUN 19 6 - 20 mg/dL   Creatinine, Ser 0.84 0.44 - 1.00 mg/dL   Calcium 9.1 8.9 - 10.3 mg/dL   Total Protein 7.7 6.5 - 8.1 g/dL   Albumin 3.4 (L) 3.5 - 5.0 g/dL   AST 293 (H) 15 - 41 U/L   ALT 241 (H) 14 - 54 U/L   Alkaline Phosphatase 175 (H) 38 - 126 U/L   Total Bilirubin 1.3 (H) 0.3 - 1.2 mg/dL   GFR calc non Af Amer >60 >60 mL/min   GFR  calc Af Amer >60 >60 mL/min    Comment: (NOTE) The eGFR has been calculated using the CKD EPI equation. This calculation has not been validated in all clinical situations. eGFR's persistently <60 mL/min signify possible Chronic Kidney Disease.    Anion gap 8 5 - 15    Comment: Performed at [facility] [address] [place], [place] [address]     HEENT: normal Cardio: RRR and no murmur Resp: CTA B/L and unlabored GI: BS positive and NT, ND Extremity:  No Edema Skin:   Intact Neuro: Other 4/5 on RIght side  HF, KE, ADF, Delt, Bi, Tri,Grip, intact bil finger to thumb Musc/Skel:  Normal and Other no pain with UE or LE ROM Gen NAD   Assessment/Plan: 1. Functional deficits secondary to right hemiparesis From Left thalamic infarct which require 3+ hours per day of interdisciplinary therapy in a comprehensive inpatient rehab setting. Physiatrist is providing close team supervision and 24 hour  management of active medical problems listed below. Physiatrist and rehab team continue to assess barriers to discharge/monitor patient progress toward functional and medical goals. FIM: Function - Bathing Position: Shower Body parts bathed by patient: Right arm, Right lower leg, Left arm, Left lower leg, Chest, Back, Abdomen, Front perineal area, Buttocks, Right upper leg, Left upper leg Assist Level: Touching or steadying assistance(Pt > 75%)  Function- Upper Body Dressing/Undressing What is the patient wearing?: Pull over shirt/dress Pull over shirt/dress - Perfomed by patient: Thread/unthread right sleeve, Thread/unthread left sleeve, Put head through opening, Pull shirt over trunk Assist Level: Supervision or verbal cues Function - Lower Body Dressing/Undressing What is the patient wearing?: Underwear, Pants, Non-skid slipper socks Position: Other (comment)(Sitting on toilet) Underwear - Performed by patient: Thread/unthread right underwear leg, Thread/unthread left underwear leg,  Pull underwear up/down Pants- Performed by patient: Thread/unthread right pants leg, Thread/unthread left pants leg, Pull pants up/down, Fasten/unfasten pants Non-skid slipper socks- Performed by patient: Don/doff right sock, Don/doff left sock Assist for footwear: Supervision/touching assist Assist for lower body dressing: Touching or steadying assistance (Pt > 75%)  Function - Toileting Toileting steps completed by patient: Adjust clothing prior to toileting, Performs perineal hygiene, Adjust clothing after toileting Toileting Assistive Devices: Grab bar or rail Assist level: Touching or steadying assistance (Pt.75%)  Function - Air cabin crew transfer assistive device: Walker, Grab bar Assist level to toilet: Touching or steadying assistance (Pt > 75%) Assist level from toilet: Touching or steadying assistance (Pt > 75%)  Function - Chair/bed transfer Chair/bed transfer method: Stand pivot Chair/bed transfer assist level: Moderate assist (Pt 50 - 74%/lift or lower) Chair/bed transfer assistive device: Armrests Chair/bed transfer details: Verbal cues for sequencing, Verbal cues for technique, Verbal cues for precautions/safety, Verbal cues for safe use of DME/AE, Manual facilitation for placement  Function - Locomotion: Wheelchair Will patient use wheelchair at discharge?: No Type: Manual Max wheelchair distance: 100' Assist Level: Touching or steadying assistance (Pt > 75%) Assist Level: Touching or steadying assistance (Pt > 75%) Wheel 150 feet activity did not occur: Safety/medical concerns Turns around,maneuvers to table,bed, and toilet,negotiates 3% grade,maneuvers on rugs and over doorsills: No Function - Locomotion: Ambulation Assistive device: Walker-rolling Max distance: 46' Assist level: Touching or steadying assistance (Pt > 75%) Assist level: Touching or steadying assistance (Pt > 75%) Assist level: Touching or steadying assistance (Pt > 75%) Walk 150 feet  activity did not occur: Safety/medical concerns Walk 10 feet on uneven surfaces activity did not occur: Safety/medical concerns  Function - Comprehension Comprehension: Auditory Comprehension assistive device: (interpreter) Comprehension assist level: Other (comment)  Function - Expression Expression: Verbal Expression assist level: Expresses basic 90% of the time/requires cueing < 10% of the time.  Function - Social Interaction Social Interaction assist level: Interacts appropriately 90% of the time - Needs monitoring or encouragement for participation or interaction.  Function - Problem Solving Problem solving assist level: Solves basic 90% of the time/requires cueing < 10% of the time  Function - Memory Memory assist level: Recognizes or recalls less than 25% of the time/requires cueing greater than 75% of the time Patient normally able to recall (first 3 days only): That he or she is in a hospital, Current season Medical Problem List and Plan: 1.  Right sided weakness and balance deficits  secondary to bilateral CVA, main fxnl def is from R thalamic infarct CIR Evals today 2.  DVT Prophylaxis/Anticoagulation: Pharmaceutical: Lovenox 3. Pain Management: Off Meloxicam. Right trap pain has resolved.  Would avoid  NSAIDs 4. Mood: LCSW to follow up for evaluation and support.  5. Neuropsych: This patient is not capable of making decisions on herown behalf. 6. Skin/Wound Care: Routine pressure relief measures.  7. Fluids/Electrolytes/Nutrition: Monitor I/O. Check lytes in am.  8. Dyslipidemia: Now on Lipitor.  9. Sleep disturbance: Continue melatonin.  10. HTN: Monitor BP bid with permissive HTN for 3-5 days--currently labile and may need prinzide resumed.  Vitals:   08/01/17 0514 08/01/17 0516  BP: (!) 144/74 120/79  Pulse: 75 (!) 102  Resp:    Temp:    SpO2:    BP normal today off meds 11. Thrombocytopenia: Monitor CBC serial and  for signs of bleeding.  12.   Transaminasemia likely secondary to statin but also with elvated bili and AlkP as well no abd pain, if pt wth post prandial pain , N/V would check GB US   LOS (Days) 2 A FACE TO FACE EVALUATION WAS PERFORMED  Charlett Blake 08/01/2017, 7:39 AM

## 2017-08-01 NOTE — IPOC Note (Signed)
Overall Plan of Care [facility]) Patient Details Name: Erin Patterson MRN: [id number] DOB: [birth year]  Admitting Diagnosis: <principal problem not specified>  Hospital Problems: Active Problems:   Embolic stroke [facility])     Functional Problem List: Nursing Safety, Bladder, Bowel, Motor  PT Balance, Endurance, Motor, Safety  OT Balance, Cognition, Safety, Endurance, Motor  SLP    TR         Basic ADL's: OT Grooming, Bathing, Dressing, Toileting     Advanced  ADL's: OT       Transfers: PT Bed Mobility, Bed to Chair, Car, Furniture, Floor  OT Toilet, Research scientist (life sciences): PT Ambulation, Stairs     Additional Impairments: OT Fuctional Use of Upper Extremity  SLP        TR      Anticipated Outcomes Item Anticipated Outcome  Self Feeding Mod I  Swallowing      Basic self-care  Supervision  Toileting  Supervision   Bathroom Transfers Supervision  Bowel/Bladder  pt will be continent of b/b with min assist  Transfers  Supervision  Locomotion  Supervision  Communication     Cognition     Pain  pt will have pain that is less than or equal to 2/10  Safety/Judgment  pt will adhere to safety precautions with no impulsive behaviour    Therapy Plan: PT Intensity: Minimum of 1-2 x/day ,45 to 90 minutes PT Frequency: 5 out of 7 days PT Duration Estimated Length of Stay: 10-12 days OT Intensity: Minimum of 1-2 x/day, 45 to 90 minutes OT Frequency: 5 out of 7 days OT Duration/Estimated Length of Stay: 10-12 days      Team Interventions: Nursing Interventions Patient/Family Education, Disease Management/Prevention, Skin Care/Wound Management, Bladder Management, Pain Management, Cognitive Remediation/Compensation, Bowel Management, Medication Management, Psychosocial Support, Discharge Planning  PT interventions Ambulation/gait training, Balance/vestibular training, Cognitive remediation/compensation, Community reintegration, Discharge planning, Disease  management/prevention, DME/adaptive equipment instruction, Functional mobility training, Neuromuscular re-education, Patient/family education, Psychosocial support, Stair training, Therapeutic Activities, Therapeutic Exercise, UE/LE Strength taining/ROM, UE/LE Coordination activities  OT Interventions Balance/vestibular training, Discharge planning, Functional electrical stimulation, Pain management, Self Care/advanced ADL retraining, Therapeutic Activities, UE/LE Coordination activities, Visual/perceptual remediation/compensation, Therapeutic Exercise, Patient/family education, Functional mobility training, Disease mangement/prevention, Cognitive remediation/compensation, Community reintegration, Fish farm manager, Neuromuscular re-education, Psychosocial support, UE/LE Strength taining/ROM  SLP Interventions    TR Interventions    SW/CM Interventions Discharge Planning, Psychosocial Support, Patient/Family Education   Barriers to Discharge MD  Medical stability and communication, language barrier  Nursing Incontinence    PT Decreased caregiver support, Medical stability, Home environment access/layout    OT      SLP      SW Other (comments) language barier   Team Discharge Planning: Destination: PT-Home ,OT- Home , SLP-  Projected Follow-up: PT-Home health PT, OT-  Home health OT, SLP-  Projected Equipment Needs: PT-Rolling walker with 5" wheels, OT- To be determined, SLP-  Equipment Details: PT- , OT-  Patient/family involved in discharge planning: PT- Patient, Family member/caregiver,  OT-Patient, Family member/caregiver, SLP-   MD ELOS: 12-16d Medical Rehab Prognosis:  Good Assessment:  84 year old non-english speaking [place] female with history of HTN, CAD, CVA with mild residual R-HP who was admitted on 07/26/17 with HA, dizziness with  two episodes of near falls and hypertensive emergency with BP 190/94 at admission.  History taken from chart review. Patient  was noted to be confused with reports of that right-sided weakness was at baseline.  CT  of head was negative.  CTA head/neck showed no emergent large vessel occlusion and moderate left M1 and mild right V4 stenosis. MRI of brain done revealing acute left thalamic infarct, subcentimeter acute infarct in posterior left and superior right cerebellum suggestive of embolic disease from heart or aorta.  2D echo done showing EF of 65 to 70% with no wall abnormality, mild left ear and severely calcified mitral valve. She was started on DAPT X 3 weeks with recommendations to continue ASA 81 mg daily after that period. Dr. Roda Shutters felt "bilateral embolic stroke due to unknown source but suspicious for A fib."     Now requiring 24/7 Rehab RN,MD, as well as CIR level PT, OT and SLP.  Treatment team will focus on ADLs and mobility with goals set at Sup   See Team Conference Notes for weekly updates to the plan of care

## 2017-08-01 NOTE — Evaluation (Addendum)
Speech Language Pathology Assessment and Plan  Patient Details  Name: Erin Patterson MRN: [id number] Date of Birth: February 10, 1933  SLP Diagnosis: Cognitive Impairments  Rehab Potential: Fair ELOS: 10-12 days     Today's Date: 08/01/2017 SLP Individual Time: 1305-1400 SLP Individual Time Calculation (min): 55 min   Problem List:  Patient Active Problem List   Diagnosis Date Noted  . Embolic stroke ([place]) 00/86/7619  . Thrombocytopenia ([place])   . Essential hypertension   . Sleep disturbance   . Dyslipidemia   . H/O: stroke   . Intracranial vascular stenosis   . Ischemic cerebrovascular accident (CVA) ([place])   . Benign essential HTN   . AKI (acute kidney injury) ([place])   . Cerebrovascular accident (CVA) due to embolism of cerebral artery ([place])   . Hypophosphatemia   . Elevated brain natriuretic peptide (BNP) level   . Cardiomegaly   . Hypertensive urgency   . Leg cramp   . Dizziness 07/26/2017  . Weakness   . Hypokalemia   . Headache 12/10/2014  . Fever 12/10/2014  . Vision loss 12/10/2014  . HLD (hyperlipidemia) 12/10/2014  . MI (myocardial infarction) ([place]) 12/10/2014  . CAD (coronary artery disease) 12/10/2014   Past Medical History:  Past Medical History:  Diagnosis Date  . Hypercholesteremia   . Hypertension   . MI (myocardial infarction) ([place])   . Stroke (cerebrum) ([place])    years ago--episode of "bleeding from right ear and onset of weakness"    Past Surgical History:  Past Surgical History:  Procedure Laterality Date  . TEE WITHOUT CARDIOVERSION N/A 07/30/2017   Procedure: TRANSESOPHAGEAL ECHOCARDIOGRAM (TEE);  Surgeon: Larey Dresser, MD;  Location: [facility] ENDOSCOPY;  Service: Cardiovascular;  Laterality: N/A;    Assessment / Plan / Recommendation Clinical Impression   Erin Patterson is an 84 year old non-english speaking [place] female with history of HTN, CAD, CVA with mild residual R-HP who was admitted on 07/26/17 with HA, dizziness with  two episodes of  near falls and hypertensive emergency with BP 190/94 at admission.  History taken from chart review. Patient was noted to be confused with reports of that right-sided weakness was at baseline.  CT of head was negative.  CTA head/neck showed no emergent large vessel occlusion and moderate left M1 and mild right V4 stenosis. MRI of brain done revealing acute left thalamic infarct, subcentimeter acute infarct in posterior left and superior right cerebellum suggestive of embolic disease from heart or aorta.  2D echo done showing EF of 65 to 70% with no wall abnormality, mild left ear and severely calcified mitral valve. She was started on DAPT X 3 weeks with recommendations to continue ASA 81 mg daily after that period. Dr. Erlinda Hong felt "bilateral embolic stroke due to unknown source but suspicious for A fib."    TEE done today moderate calcifications on MV and AV with grade 3 plaque in descending thoracic aorta and arch and negative for PFO/ASD. Patient declined Loop recorder placement and will need 30 day event monitor after discharge.  BLE dopplers ordered and pending. Patient continues to be limited by right sided weakness and balance deficits.  Therapy ongoing and CIR recommended due to functional deficits.  SLP evaluation was completed 08/01/2017 with results as follows:   Pt presents with moderately severe cognitive deficits although it is uncertain to what extent these are baseline versus acute.  Husband reports that pt's memory was "very bad" at baseline and she would ask the same questions repeatedly.  When asked how long these deficits have been occurring, husband stated that they have recently been exacerbated over the last 2-3 months.  Pt is oriented to place and person only.  She reports that she is not typically aware of the date at baseline; however, she was unable to be reoriented to situation despite max assist cues/choice of three.  Pt's speech was fluent and free from dysarthria or word finding  impairments; however, pt was resistant to any formalized assessment of cognition.  Pt presents with decreased frustration tolerance and decreased safety awareness which became more apparent at the end of today's evaluation when pt refused to comply with nurse tech when asked for orthostatic vitals in standing for no specified reason (would allow tech to take vitals in sitting and supine).  After this particular episode pt became increasingly withdrawn and needed max to total assist to interact with therapist.  Given the abovementioned deficits, pt would benefit from skilled ST while inpatient in order to maximize functional independence and reduce burden of care prior to discharge.  Anticipate that pt will need 24/7 supervision at discharge and possibly ST follow up at next level of care depending on progress made while inpatient.      Skilled Therapeutic Interventions          Cognitive-linguistic evaluation completed with results and recommendations reviewed with family.     SLP Assessment  Patient will need skilled [place] [facility] Services during CIR admission    Recommendations  Recommendations for Other Services: Neuropsych consult Patient destination: Home Follow up Recommendations: Home Health SLP;24 hour supervision/assistance Equipment Recommended: None recommended by SLP    SLP Frequency 3 to 5 out of 7 days   SLP Duration  SLP Intensity  SLP Treatment/Interventions 10-12 days   Minumum of 1-2 x/day, 30 to 90 minutes  Cognitive remediation/compensation;Cueing hierarchy;Patient/family education;Internal/external aids;Environmental controls;Functional tasks    Pain Pain Assessment Pain Scale: 0-10 Pain Score: 0-No pain  Prior Functioning Cognitive/Linguistic Baseline: Baseline deficits Baseline deficit details: husband reports significant memory deficits  Type of Home: Apartment  Lives With: Spouse;Daughter Available Help at Discharge: Family;Available 24  hours/day Vocation: Unemployed  Function:  Eating Eating                 Cognition Comprehension Comprehension assist level: Understands basic 50 - 74% of the time/ requires cueing 25 - 49% of the time  Expression   Expression assist level: Expresses basic 90% of the time/requires cueing < 10% of the time.  Social Interaction Social Interaction assist level: Interacts appropriately with others with medication or extra time (anti-anxiety, antidepressant).  Problem Solving Problem solving assist level: Solves basic 50 - 74% of the time/requires cueing 25 - 49% of the time  Memory Memory assist level: Recognizes or recalls 25 - 49% of the time/requires cueing 50 - 75% of the time   Short Term Goals: Week 1: SLP Short Term Goal 1 (Week 1): Pt will utilize external aids to recall basic, daily information with mod assist verbal cues.   SLP Short Term Goal 2 (Week 1): Pt will complete basic, familiar tasks with mod assist for functional problem solving.  SLP Short Term Goal 3 (Week 1): Pt will return demonstration of at least 2 safety precautions during functional tasks with mod assist.   Refer to Care Plan for Long Term Goals  Recommendations for other services: Neuropsych  Discharge Criteria: Patient will be discharged from SLP if patient refuses treatment 3 consecutive times without medical reason,  if treatment goals not met, if there is a change in medical status, if patient makes no progress towards goals or if patient is discharged from hospital.  The above assessment, treatment plan, treatment alternatives and goals were discussed and mutually agreed upon: by patient  Emilio Math 08/01/2017, 3:40 PM

## 2017-08-02 ENCOUNTER — Inpatient Hospital Stay (HOSPITAL_COMMUNITY): Payer: Medicaid Other | Admitting: Physical Therapy

## 2017-08-02 ENCOUNTER — Inpatient Hospital Stay (HOSPITAL_COMMUNITY): Payer: Medicaid Other | Admitting: Speech Pathology

## 2017-08-02 ENCOUNTER — Inpatient Hospital Stay (HOSPITAL_COMMUNITY): Payer: Medicaid Other

## 2017-08-02 DIAGNOSIS — I6349 Cerebral infarction due to embolism of other cerebral artery: Secondary | ICD-10-CM

## 2017-08-02 DIAGNOSIS — R740 Nonspecific elevation of levels of transaminase and lactic acid dehydrogenase [LDH]: Secondary | ICD-10-CM

## 2017-08-02 MED ORDER — STROKE: EARLY STAGES OF RECOVERY BOOK
Freq: Once | Status: AC
  Administered 2017-08-02: 14:00:00
  Filled 2017-08-02: qty 1

## 2017-08-02 NOTE — Progress Notes (Signed)
Occupational Therapy Session Note  Patient Details  Name: Erin Patterson MRN: [id number] Date of Birth: [birth year]  Today's Date: 08/02/2017 OT Individual Time: 1020-1100 OT Individual Time Calculation (min): 40 min    Short Term Goals: Week 1:  OT Short Term Goal 1 (Week 1): Pt will correctly place hands on RW during sit<> stand mod I OT Short Term Goal 2 (Week 1): Pt will use R UE at non-dominant level mod I OT Short Term Goal 3 (Week 1): Pt will manage RW in functional context during ADL session with no more than 2 VCs  Skilled Therapeutic Interventions/Progress Updates:    1;1. Interpreter and husband present. Pt received seated in bed reporting pain. RN aware and delivers pain medication. Pt reporting fatigue and OT offers to work in seated UE coordination/FMC activities and pt initially agrees. However, when OT brings over w/c pt declines getting OOB and doing anything besides resting. OT educates on importance of participating in tx to improve functional independence as well as endurance. OT allows pt to rest 20 min and follows up to attempt again to continue tx. Upon return pt agreeable to tx. Pt requires cueing for hand placement/pivoting directions for stand pivot transfer EOB<>w/c with min A. Pt completes [facility] activities of palm<>finger translation of coins and small beads frequenly droping beads when trying to thread onto string to improve FMC/R NMR. Pt requies MOD cues to decrease compensatory streatiegies and try to use LUE. Exited session with pt seated in bed, call light in reach and all needs met.   Therapy Documentation Precautions:  Precautions Precautions: Fall Restrictions Weight Bearing Restrictions: No General:    See Function Navigator for Current Functional Status.   Therapy/Group: Individual Therapy  Tonny Branch 08/02/2017, 10:14 AM

## 2017-08-02 NOTE — Progress Notes (Signed)
Speech Language Pathology Daily Session Note  Patient Details  Name: Erin Patterson MRN: [id number] Date of Birth: 1933/02/23  Today's Date: 08/02/2017 SLP Individual Time: 3888-7579 SLP Individual Time Calculation (min): 30 min  Short Term Goals: Week 1: SLP Short Term Goal 1 (Week 1): Pt will utilize external aids to recall basic, daily information with mod assist verbal cues.   SLP Short Term Goal 2 (Week 1): Pt will complete basic, familiar tasks with mod assist for functional problem solving.  SLP Short Term Goal 3 (Week 1): Pt will return demonstration of at least 2 safety precautions during functional tasks with mod assist.   Skilled Therapeutic Interventions: Skilled treatment session focused on cognitive goals. Patient was independently oriented to situation but required total A for orientation to date. Patient unable to utilize external aids due to inability to read or write at baseline. Patient reported she enjoyed cooking and generated a list of ingredients and organized the steps to the recipe with overall Mod A verbal cues for organization and recall. Patient left sitting EOB with family present and alarm on. Continue with current plan of care.       Function:   Cognition Comprehension Comprehension assist level: Understands basic 90% of the time/cues < 10% of the time  Expression   Expression assist level: Expresses basic 90% of the time/requires cueing < 10% of the time.  Social Interaction Social Interaction assist level: Interacts appropriately 90% of the time - Needs monitoring or encouragement for participation or interaction.  Problem Solving Problem solving assist level: Solves basic 75 - 89% of the time/requires cueing 10 - 24% of the time  Memory Memory assist level: Recognizes or recalls 50 - 74% of the time/requires cueing 25 - 49% of the time    Pain Pain Assessment Pain Score: 0-No pain  Therapy/Group: Individual Therapy  Daryl Beehler 08/02/2017, 2:11  PM

## 2017-08-02 NOTE — Progress Notes (Addendum)
Physical Therapy Session Note  Patient Details  Name: Erin Patterson MRN: [id number] Date of Birth: [birth year]  Today's Date: 08/02/2017 PT Individual Time: 0904-1000 and 1334-1416 PT Individual Time Calculation (min): 56 min and 42 min  Short Term Goals: Week 1:  PT Short Term Goal 1 (Week 1): Pt will complete transfers with min A consistently PT Short Term Goal 2 (Week 1): Pt will ambulate x 150 ft with RW and min A PT Short Term Goal 3 (Week 1): Pt will ascend/descend 4 stairs with 2 handrails and min A  Skilled Therapeutic Interventions/Progress Updates:  Treatment 1: Pt received in bed & agreeable to treatment. Professional interpreter present. Pt reports "mild" pain in B hip flexors while ambulating but declines requesting pain medication until end of session. Pt transfers to EOB with supervision and ambulates room<>gym with RW and steady assist. Pt with significantly decreased gait speed (6 min to ambulate gym>room), pt reports this is baseline and pt unable to increase speed despite cuing. Therapist provides frequent cuing to ambulate within base of RW as pt pushing it too far out in front of her, as well as for increased step length but poor return demo. Pt easily distracted during gait and demonstrates poor foot clearance BLE (R>L) and absent heel strike. In gym pt transferred to supine on mat table & performed BLE bridging with adductor hold and single leg bridging with each LE; pt with very minimal hip extension requiring max cuing to clear buttocks from mat. Pt performed the following BLE strengthening exercises while standing with UE support and therapist providing cuing for proper technique: standing hip/knee flexion, hip abduction, & heel raises.  Pt negotiates 8 steps with BUE on L rail as pt & husband report they have 6 steps to enter their home with a L rail. Pt requires steady assist for stair negotiation and demonstrates decreased foot clearance RLE 2/2 weak dorsiflexors and overall  BLE weakness. Gait without AD for short distances with min assist with pt demonstrating decreased foot clearance compensating with excessive RLE hip/knee flexion. Educated pt & husband on need for pt to have shoes to try brace as pt may benefit from R foot up brace; pt unable to sufficiently increase heel strike and foot clearance with cuing. Pt's husband reports he will purchase pt tennis shoes. At end of session pt left in bed with alarm set & all needs within reach. 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    Treatment 2: Professional interpreter present for session. Pt received in bed & agreeable to tx; pt denies c/o pain & fatigue. Pt ambulates throughout unit with RW & close supervision with max, frequent cuing to ambulate within base of RW and to increase RLE heel strike with poor return demo. Pt required min assist on one occasion 2/2 LOB to L during busy, distracting hallway. Pt utilized cybex kinetron in sitting then standing with BUE support with task focusing on weight shifting L<>R, balance, and NMR. Pt requires MAX multimodal cuing for upright posture & forward gaze and requires frequent rest breaks 2/2 fatigue. Pt utilized nu-step up to level 5 x 10 minutes all four extremities with task focusing on coordination of reciprocal movements, strengthening, & endurance training. Pt completed sit<>stand repetitions without BUE support with task focusing on BLE strengthening and heavily on eccentric control when sitting. At end of session pt left in bed with alarm set, call bell in reach,  family present.    Therapy Documentation Precautions:  Precautions Precautions: Fall Restrictions Weight Bearing Restrictions: No    See Function Navigator for Current Functional Status.   Therapy/Group: Individual Therapy  Sandi Mariscal 08/02/2017, 2:20 PM

## 2017-08-02 NOTE — Progress Notes (Signed)
Subjective/Complaints: Patient sitting up in bed.  Husband at bedside.  Denies any problems.  ROS: limited due to language/communication    Objective: Vital Signs: Blood pressure 95/73, pulse (!) 106, temperature 98.8 F (37.1 C), temperature source Oral, resp. rate 16, height _0  (1.626 m), weight 52.6 kg (115 lb 15.4 oz), SpO2 98 %. No results found. Results for orders placed or performed during the hospital encounter of 07/30/17 (from the past 72 hour(s))  CBC WITH DIFFERENTIAL     Status: Abnormal   Collection Time: 07/31/17  7:13 AM  Result Value Ref Range   WBC 5.9 4.0 - 10.5 K/uL   RBC 5.38 (H) 3.87 - 5.11 MIL/uL   Hemoglobin 13.8 12.0 - 15.0 g/dL   HCT 42.3 36.0 - 46.0 %   MCV 78.6 78.0 - 100.0 fL   MCH 25.7 (L) 26.0 - 34.0 pg   MCHC 32.6 30.0 - 36.0 g/dL   RDW 15.6 (H) 11.5 - 15.5 %   Platelets 158 150 - 400 K/uL   Neutrophils Relative % 70 %   Neutro Abs 4.1 1.7 - 7.7 K/uL   Lymphocytes Relative 20 %   Lymphs Abs 1.2 0.7 - 4.0 K/uL   Monocytes Relative 9 %   Monocytes Absolute 0.6 0.1 - 1.0 K/uL   Eosinophils Relative 1 %   Eosinophils Absolute 0.1 0.0 - 0.7 K/uL   Basophils Relative 0 %   Basophils Absolute 0.0 0.0 - 0.1 K/uL   Immature Granulocytes 0 %   Abs Immature Granulocytes 0.0 0.0 - 0.1 K/uL    Comment: Performed at [facility], [address] [place], [place] [address]  Comprehensive metabolic panel     Status: Abnormal   Collection Time: 07/31/17  7:13 AM  Result Value Ref Range   Sodium 140 135 - 145 mmol/L   Potassium 4.1 3.5 - 5.1 mmol/L   Chloride 102 101 - 111 mmol/L   CO2 30 22 - 32 mmol/L   Glucose, Bld 108 (H) 65 - 99 mg/dL   BUN 19 6 - 20 mg/dL   Creatinine, Ser 0.84 0.44 - 1.00 mg/dL   Calcium 9.1 8.9 - 10.3 mg/dL   Total Protein 7.7 6.5 - 8.1 g/dL   Albumin 3.4 (L) 3.5 - 5.0 g/dL   AST 293 (H) 15 - 41 U/L   ALT 241 (H) 14 - 54 U/L   Alkaline Phosphatase 175 (H) 38 - 126 U/L   Total Bilirubin 1.3 (H) 0.3 - 1.2 mg/dL   GFR calc non Af Amer >60 >60 mL/min   GFR calc Af Amer >60 >60 mL/min    Comment: (NOTE) The eGFR has been calculated using the CKD EPI equation. This calculation has not been validated in all clinical situations. eGFR's persistently <60 mL/min signify possible Chronic Kidney Disease.    Anion gap 8 5 - 15    Comment: Performed at [facility] [address] [place], [place] [address]     Constitutional: No distress . Vital signs reviewed. HEENT: EOMI, oral membranes moist Neck: supple Cardiovascular: RRR without murmur. No JVD    Respiratory: CTA Bilaterally without wheezes or rales. Normal effort    GI: BS +, non-tender, non-distended  Extremity:  No Edema Skin:   Intact Neuro: Other 4/5 on RIght side  HF, KE, ADF, Delt, Bi, Tri,Grip, intact bil finger to thumb Musc/Skel:  Normal and Other no pain with UE or LE ROM Gen NAD   Assessment/Plan: 1. Functional  deficits secondary to right hemiparesis From Left thalamic infarct which require 3+ hours per day of interdisciplinary therapy in a comprehensive inpatient rehab setting. Physiatrist is providing close team supervision and 24 hour management of active medical problems listed below. Physiatrist and rehab team continue to assess barriers to discharge/monitor patient progress toward functional and medical goals. FIM: Function - Bathing Position: Shower Body parts bathed by patient: Right arm, Right lower leg, Left arm, Left lower leg, Chest, Back, Abdomen, Front perineal area, Buttocks, Right upper leg, Left upper leg Assist Level: Supervision or verbal cues  Function- Upper Body Dressing/Undressing What is the patient wearing?: Pull over shirt/dress Pull over shirt/dress - Perfomed by patient: Thread/unthread right sleeve, Thread/unthread left sleeve, Put head through opening, Pull shirt over trunk Assist Level: Supervision or verbal cues Function - Lower Body Dressing/Undressing What is the patient wearing?:  Underwear, Pants, Non-skid slipper socks Position: Other (comment)(Sitting on toilet) Underwear - Performed by patient: Thread/unthread right underwear leg, Thread/unthread left underwear leg, Pull underwear up/down Pants- Performed by patient: Thread/unthread right pants leg, Thread/unthread left pants leg, Pull pants up/down, Fasten/unfasten pants Non-skid slipper socks- Performed by patient: Don/doff right sock, Don/doff left sock Assist for footwear: Supervision/touching assist Assist for lower body dressing: Touching or steadying assistance (Pt > 75%)  Function - Toileting Toileting steps completed by patient: Adjust clothing prior to toileting, Performs perineal hygiene, Adjust clothing after toileting Toileting Assistive Devices: Grab bar or rail Assist level: Touching or steadying assistance (Pt.75%)  Function - Air cabin crew transfer assistive device: Walker Assist level to toilet: Touching or steadying assistance (Pt > 75%) Assist level from toilet: Touching or steadying assistance (Pt > 75%)  Function - Chair/bed transfer Chair/bed transfer method: Ambulatory, Stand pivot Chair/bed transfer assist level: Touching or steadying assistance (Pt > 75%) Chair/bed transfer assistive device: Armrests Chair/bed transfer details: Verbal cues for sequencing, Verbal cues for technique, Verbal cues for precautions/safety, Verbal cues for safe use of DME/AE, Manual facilitation for placement  Function - Locomotion: Wheelchair Will patient use wheelchair at discharge?: No Type: Manual Max wheelchair distance: 100' Assist Level: Touching or steadying assistance (Pt > 75%) Assist Level: Touching or steadying assistance (Pt > 75%) Wheel 150 feet activity did not occur: Safety/medical concerns Turns around,maneuvers to table,bed, and toilet,negotiates 3% grade,maneuvers on rugs and over doorsills: No Function - Locomotion: Ambulation Assistive device: Hand held assist Max  distance: 100' Assist level: Touching or steadying assistance (Pt > 75%) Assist level: Touching or steadying assistance (Pt > 75%) Assist level: Touching or steadying assistance (Pt > 75%) Walk 150 feet activity did not occur: Safety/medical concerns Walk 10 feet on uneven surfaces activity did not occur: Safety/medical concerns  Function - Comprehension Comprehension: Auditory Comprehension assistive device: Other(interpreter) Comprehension assist level: Other (comment)  Function - Expression Expression: Verbal Expression assist level: Expresses basic 90% of the time/requires cueing < 10% of the time.  Function - Social Interaction Social Interaction assist level: Interacts appropriately with others with medication or extra time (anti-anxiety, antidepressant).  Function - Problem Solving Problem solving assist level: Solves basic 50 - 74% of the time/requires cueing 25 - 49% of the time  Function - Memory Memory assist level: Recognizes or recalls 25 - 49% of the time/requires cueing 50 - 75% of the time Patient normally able to recall (first 3 days only): That he or she is in a hospital Medical Problem List and Plan: 1.  Right sided weakness and balance deficits  secondary to bilateral CVA, main fxnl  def is from R thalamic infarct CIR therapies ongoing 2.  DVT Prophylaxis/Anticoagulation: Pharmaceutical: Lovenox 3. Pain Management: Off Meloxicam. Right trap pain has resolved.   -Would avoid NSAIDs 4. Mood: LCSW to follow up for evaluation and support.  5. Neuropsych: This patient is not capable of making decisions on herown behalf. 6. Skin/Wound Care: Routine pressure relief measures.  7. Fluids/Electrolytes/Nutrition: Monitor I/O. Check lytes in am.  8. Dyslipidemia: Now on Lipitor.  9. Sleep disturbance: Continue melatonin.  10. HTN: Monitor BP bid with permissive HTN for 3-5 days--currently labile and may need prinzide resumed.  Vitals:   08/02/17 0518 08/02/17 0519  BP:  123/77 95/73  Pulse: 97 (!) 106  Resp:    Temp:    SpO2:    BP normal to low, continue to hold medications 11. Thrombocytopenia: Monitor CBC serial and  for signs of bleeding.  12.  Transaminasemia likely secondary to statin but also with elvated bili and AlkP as well   -still no abd pain, if pt wth post prandial pain , N/V would check GB US   LOS (Days) 3 A FACE TO FACE EVALUATION WAS PERFORMED  Meredith Staggers 08/02/2017, 8:52 AM

## 2017-08-03 NOTE — Progress Notes (Signed)
Subjective/Complaints: No new problems overnight.  Has been present.  ROS: Patient denies fever, rash, sore throat, blurred vision, nausea, vomiting, diarrhea, cough, shortness of breath or chest pain, joint or back pain, headache, or mood change.    Objective: Vital Signs: Blood pressure (!) 150/73, pulse 85, temperature 98.6 F (37 C), temperature source Oral, resp. rate 18, height 5\' 4"  (1.626 m), weight 51.5 kg (113 lb 8.6 oz), SpO2 98 %. No results found. No results found for this or any previous visit (from the past 72 hour(s)).   Constitutional: No distress . Vital signs reviewed. HEENT: EOMI, oral membranes moist Neck: supple Cardiovascular: RRR without murmur. No JVD    Respiratory: CTA Bilaterally without wheezes or rales. Normal effort    GI: BS +, non-tender, non-distended  Extremity:  No Edema Skin:   Intact Neuro: Other 4/5 on RIght side  HF, KE, ADF, Delt, Bi, Tri,Grip, intact bil finger to thumb Musc/Skel:  Normal and Other no pain with UE or LE ROM Gen NAD   Assessment/Plan: 1. Functional deficits secondary to right hemiparesis From Left thalamic infarct which require 3+ hours per day of interdisciplinary therapy in a comprehensive inpatient rehab setting. Physiatrist is providing close team supervision and 24 hour management of active medical problems listed below. Physiatrist and rehab team continue to assess barriers to discharge/monitor patient progress toward functional and medical goals. FIM: Function - Bathing Position: Shower Body parts bathed by patient: Right arm, Right lower leg, Left arm, Left lower leg, Chest, Back, Abdomen, Front perineal area, Buttocks, Right upper leg, Left upper leg Assist Level: Supervision or verbal cues  Function- Upper Body Dressing/Undressing What is the patient wearing?: Pull over shirt/dress Pull over shirt/dress - Perfomed by patient: Thread/unthread right sleeve, Thread/unthread left sleeve, Put head through opening,  Pull shirt over trunk Assist Level: Supervision or verbal cues Function - Lower Body Dressing/Undressing What is the patient wearing?: Underwear, Pants, Non-skid slipper socks Position: Other (comment)(Sitting on toilet) Underwear - Performed by patient: Thread/unthread right underwear leg, Thread/unthread left underwear leg, Pull underwear up/down Pants- Performed by patient: Thread/unthread right pants leg, Thread/unthread left pants leg, Pull pants up/down, Fasten/unfasten pants Non-skid slipper socks- Performed by patient: Don/doff right sock, Don/doff left sock Assist for footwear: Supervision/touching assist Assist for lower body dressing: Touching or steadying assistance (Pt > 75%)  Function - Toileting Toileting steps completed by patient: Adjust clothing prior to toileting, Performs perineal hygiene, Adjust clothing after toileting Toileting Assistive Devices: Grab bar or rail Assist level: Supervision or verbal cues  Function - Archivist transfer assistive device: Hospital doctor level to toilet: Supervision or verbal cues Assist level from toilet: Supervision or verbal cues  Function - Chair/bed transfer Chair/bed transfer method: Ambulatory, Stand pivot Chair/bed transfer assist level: Touching or steadying assistance (Pt > 75%) Chair/bed transfer assistive device: Armrests, Walker Chair/bed transfer details: Verbal cues for sequencing, Verbal cues for technique, Verbal cues for precautions/safety, Verbal cues for safe use of DME/AE, Manual facilitation for placement  Function - Locomotion: Wheelchair Will patient use wheelchair at discharge?: No Type: Manual Max wheelchair distance: 100' Assist Level: Touching or steadying assistance (Pt > 75%) Assist Level: Touching or steadying assistance (Pt > 75%) Wheel 150 feet activity did not occur: Safety/medical concerns Turns around,maneuvers to table,bed, and toilet,negotiates 3% grade,maneuvers on rugs and over  doorsills: No Function - Locomotion: Ambulation Assistive device: Walker-rolling Max distance: 100 ft Assist level: Touching or steadying assistance (Pt > 75%) Assist level: Touching or steadying assistance (  Pt > 75%) Assist level: Touching or steadying assistance (Pt > 75%) Walk 150 feet activity did not occur: Safety/medical concerns Walk 10 feet on uneven surfaces activity did not occur: Safety/medical concerns  Function - Comprehension Comprehension: Auditory Comprehension assistive device: Other(interpreter) Comprehension assist level: Understands basic 90% of the time/cues < 10% of the time  Function - Expression Expression: Verbal Expression assist level: Expresses basic 90% of the time/requires cueing < 10% of the time.  Function - Social Interaction Social Interaction assist level: Interacts appropriately 90% of the time - Needs monitoring or encouragement for participation or interaction.  Function - Problem Solving Problem solving assist level: Solves basic 75 - 89% of the time/requires cueing 10 - 24% of the time  Function - Memory Memory assist level: Recognizes or recalls 50 - 74% of the time/requires cueing 25 - 49% of the time Patient normally able to recall (first 3 days only): That he or she is in a hospital, Staff names and faces, Current season, Location of own room Medical Problem List and Plan: 1.  Right sided weakness and balance deficits  secondary to bilateral CVA, main fxnl def is from R thalamic infarct   -Continue CIR therapies 2.  DVT Prophylaxis/Anticoagulation: Pharmaceutical: Lovenox 3. Pain Management: Off Meloxicam. Right trap pain has resolved.   - avoid NSAIDs 4. Mood: LCSW to follow up for evaluation and support.  5. Neuropsych: This patient is not capable of making decisions on herown behalf. 6. Skin/Wound Care: Routine pressure relief measures.  7. Fluids/Electrolytes/Nutrition: Monitor I/O. Check lytes in am.  8. Dyslipidemia: Now on  Lipitor.  9. Sleep disturbance: Continue melatonin.  10. HTN: Monitor BP bid with permissive HTN for 3-5 days--currently labile and may need prinzide resumed.  Vitals:   08/03/17 0327 08/03/17 0329  BP: 116/76 (!) 150/73  Pulse: (!) 101 85  Resp:    Temp:    SpO2:    BP remains normal to low, continue to hold medications 11. Thrombocytopenia: Monitor CBC serial and  for signs of bleeding.  12.  Transaminasemia likely secondary to statin but also with elvated bili and AlkP as well   -still no abd pain, if pt wth post prandial pain , N/V would check GB US   LOS (Days) 4 A FACE TO FACE EVALUATION WAS PERFORMED  Ranelle Oyster 08/03/2017, 8:49 AM

## 2017-08-04 ENCOUNTER — Inpatient Hospital Stay (HOSPITAL_COMMUNITY): Payer: Medicaid Other

## 2017-08-04 ENCOUNTER — Inpatient Hospital Stay (HOSPITAL_COMMUNITY): Payer: Medicaid Other | Admitting: Occupational Therapy

## 2017-08-04 ENCOUNTER — Inpatient Hospital Stay (HOSPITAL_COMMUNITY): Payer: Medicaid Other | Admitting: Physical Therapy

## 2017-08-04 LAB — HEPATIC FUNCTION PANEL
ALK PHOS: 133 U/L — AB (ref 38–126)
ALT: 103 U/L — AB (ref 14–54)
AST: 93 U/L — AB (ref 15–41)
Albumin: 2.8 g/dL — ABNORMAL LOW (ref 3.5–5.0)
BILIRUBIN INDIRECT: 0.5 mg/dL (ref 0.3–0.9)
BILIRUBIN TOTAL: 0.6 mg/dL (ref 0.3–1.2)
Bilirubin, Direct: 0.1 mg/dL (ref 0.1–0.5)
Total Protein: 6.6 g/dL (ref 6.5–8.1)

## 2017-08-04 LAB — BASIC METABOLIC PANEL
Anion gap: 9 (ref 5–15)
BUN: 24 mg/dL — ABNORMAL HIGH (ref 6–20)
CHLORIDE: 105 mmol/L (ref 101–111)
CO2: 24 mmol/L (ref 22–32)
Calcium: 8.5 mg/dL — ABNORMAL LOW (ref 8.9–10.3)
Creatinine, Ser: 0.77 mg/dL (ref 0.44–1.00)
GFR calc non Af Amer: >60 mL/min (ref >60)
Glucose, Bld: 91 mg/dL (ref 65–99)
POTASSIUM: 4.3 mmol/L (ref 3.5–5.1)
Sodium: 138 mmol/L (ref 135–145)

## 2017-08-04 LAB — CBC
HEMATOCRIT: 35.3 % — AB (ref 36.0–46.0)
Hemoglobin: 11.6 g/dL — ABNORMAL LOW (ref 12.0–15.0)
MCH: 25.5 pg — AB (ref 26.0–34.0)
MCHC: 32.9 g/dL (ref 30.0–36.0)
MCV: 77.6 fL — AB (ref 78.0–100.0)
PLATELETS: 163 10*3/uL (ref 150–400)
RBC: 4.55 MIL/uL (ref 3.87–5.11)
RDW: 15.2 % (ref 11.5–15.5)
WBC: 5.1 10*3/uL (ref 4.0–10.5)

## 2017-08-04 NOTE — Progress Notes (Signed)
Subjective/Complaints: No new problems overnight.    ROS: limited language  Objective: Vital Signs: Blood pressure (!) 148/86, pulse 87, temperature 98.1 F (36.7 C), temperature source Oral, resp. rate 12, height 5' 4"  (1.626 m), weight 55.2 kg (121 lb 11.1 oz), SpO2 100 %. No results found. Results for orders placed or performed during the hospital encounter of 07/30/17 (from the past 72 hour(s))  Basic metabolic panel     Status: Abnormal   Collection Time: 08/04/17  5:43 AM  Result Value Ref Range   Sodium 138 135 - 145 mmol/L   Potassium 4.3 3.5 - 5.1 mmol/L   Chloride 105 101 - 111 mmol/L   CO2 24 22 - 32 mmol/L   Glucose, Bld 91 65 - 99 mg/dL   BUN 24 (H) 6 - 20 mg/dL   Creatinine, Ser 0.77 0.44 - 1.00 mg/dL   Calcium 8.5 (L) 8.9 - 10.3 mg/dL   GFR calc non Af Amer >60 >60 mL/min   GFR calc Af Amer >60 >60 mL/min    Comment: (NOTE) The eGFR has been calculated using the CKD EPI equation. This calculation has not been validated in all clinical situations. eGFR's persistently <60 mL/min signify possible Chronic Kidney Disease.    Anion gap 9 5 - 15    Comment: Performed at [facility] [address], [place], [place] [address]  CBC     Status: Abnormal   Collection Time: 08/04/17  5:43 AM  Result Value Ref Range   WBC 5.1 4.0 - 10.5 K/uL   RBC 4.55 3.87 - 5.11 MIL/uL   Hemoglobin 11.6 (L) 12.0 - 15.0 g/dL   HCT 35.3 (L) 36.0 - 46.0 %   MCV 77.6 (L) 78.0 - 100.0 fL   MCH 25.5 (L) 26.0 - 34.0 pg   MCHC 32.9 30.0 - 36.0 g/dL   RDW 15.2 11.5 - 15.5 %   Platelets 163 150 - 400 K/uL    Comment: Performed at [facility], [place] [address] [place], [place] [address]  Hepatic function panel     Status: Abnormal   Collection Time: 08/04/17  5:43 AM  Result Value Ref Range   Total Protein 6.6 6.5 - 8.1 g/dL   Albumin 2.8 (L) 3.5 - 5.0 g/dL   AST 93 (H) 15 - 41 U/L   ALT 103 (H) 14 - 54 U/L   Alkaline Phosphatase 133 (H) 38 - 126 U/L   Total Bilirubin 0.6  0.3 - 1.2 mg/dL   Bilirubin, Direct 0.1 0.1 - 0.5 mg/dL   Indirect Bilirubin 0.5 0.3 - 0.9 mg/dL    Comment: Performed at [facility] [address], [place],  [address]     Constitutional: No distress . Vital signs reviewed. HEENT: EOMI, oral membranes moist Neck: supple Cardiovascular: RRR without murmur. No JVD    Respiratory: CTA Bilaterally without wheezes or rales. Normal effort    GI: BS +, non-tender, non-distended  Extremity:  No Edema Skin:   Intact Neuro: Other 4/5 on RIght side  HF, KE, ADF, Delt, Bi, Tri,Grip, intact bil finger to thumb Musc/Skel:  Normal and Other no pain with UE or LE ROM Gen NAD   Assessment/Plan: 1. Functional deficits secondary to right hemiparesis From Left thalamic infarct which require 3+ hours per day of interdisciplinary therapy in a comprehensive inpatient rehab setting. Physiatrist is providing close team supervision and 24 hour management of active medical problems listed below. Physiatrist and rehab team continue  to assess barriers to discharge/monitor patient progress toward functional and medical goals. FIM: Function - Bathing Position: Shower Body parts bathed by patient: Right arm, Right lower leg, Left arm, Left lower leg, Chest, Back, Abdomen, Front perineal area, Buttocks, Right upper leg, Left upper leg Assist Level: Supervision or verbal cues  Function- Upper Body Dressing/Undressing What is the patient wearing?: Pull over shirt/dress Pull over shirt/dress - Perfomed by patient: Thread/unthread right sleeve, Thread/unthread left sleeve, Put head through opening, Pull shirt over trunk Assist Level: Supervision or verbal cues Function - Lower Body Dressing/Undressing What is the patient wearing?: Underwear, Pants, Non-skid slipper socks Position: Other (comment)(Sitting on toilet) Underwear - Performed by patient: Thread/unthread right underwear leg, Thread/unthread left underwear leg, Pull underwear up/down Pants-  Performed by patient: Thread/unthread right pants leg, Thread/unthread left pants leg, Pull pants up/down, Fasten/unfasten pants Non-skid slipper socks- Performed by patient: Don/doff right sock, Don/doff left sock Assist for footwear: Supervision/touching assist Assist for lower body dressing: Touching or steadying assistance (Pt > 75%)  Function - Toileting Toileting steps completed by patient: Adjust clothing prior to toileting, Performs perineal hygiene, Adjust clothing after toileting Toileting Assistive Devices: Grab bar or rail Assist level: Supervision or verbal cues  Function - Air cabin crew transfer assistive device: Pension scheme manager level to toilet: Supervision or verbal cues Assist level from toilet: Supervision or verbal cues  Function - Chair/bed transfer Chair/bed transfer method: Ambulatory, Stand pivot Chair/bed transfer assist level: Touching or steadying assistance (Pt > 75%) Chair/bed transfer assistive device: Armrests, Walker Chair/bed transfer details: Verbal cues for sequencing, Verbal cues for technique, Verbal cues for precautions/safety, Verbal cues for safe use of DME/AE, Manual facilitation for placement  Function - Locomotion: Wheelchair Will patient use wheelchair at discharge?: No Type: Manual Max wheelchair distance: 100' Assist Level: Touching or steadying assistance (Pt > 75%) Assist Level: Touching or steadying assistance (Pt > 75%) Wheel 150 feet activity did not occur: Safety/medical concerns Turns around,maneuvers to table,bed, and toilet,negotiates 3% grade,maneuvers on rugs and over doorsills: No Function - Locomotion: Ambulation Assistive device: Walker-rolling Max distance: 100 ft Assist level: Touching or steadying assistance (Pt > 75%) Assist level: Touching or steadying assistance (Pt > 75%) Assist level: Touching or steadying assistance (Pt > 75%) Walk 150 feet activity did not occur: Safety/medical concerns Walk 10 feet on  uneven surfaces activity did not occur: Safety/medical concerns  Function - Comprehension Comprehension: Auditory Comprehension assistive device: Other(interpreter) Comprehension assist level: Understands basic 90% of the time/cues < 10% of the time  Function - Expression Expression: Verbal Expression assist level: Expresses basic 50 - 74% of the time/requires cueing 25 - 49% of the time. Needs to repeat parts of sentences.(interpreter; language barrier)  Function - Social Interaction Social Interaction assist level: Interacts appropriately 50 - 74% of the time - May be physically or verbally inappropriate.(language barrier)  Function - Problem Solving Problem solving assist level: Solves basic 75 - 89% of the time/requires cueing 10 - 24% of the time(language barrier)  Function - Memory Memory assist level: Recognizes or recalls 50 - 74% of the time/requires cueing 25 - 49% of the time Patient normally able to recall (first 3 days only): That he or she is in a hospital, Staff names and faces, Current season, Location of own room Medical Problem List and Plan: 1.  Right sided weakness and balance deficits  secondary to bilateral CVA, main fxnl def is from R thalamic infarct   -Continue CIR therapies 2.  DVT Prophylaxis/Anticoagulation: Pharmaceutical:  Lovenox 3. Pain Management: Off Meloxicam. Right trap pain has resolved.   - avoid NSAIDs 4. Mood: LCSW to follow up for evaluation and support.  5. Neuropsych: This patient is not capable of making decisions on herown behalf. 6. Skin/Wound Care: Routine pressure relief measures.  7. Fluids/Electrolytes/Nutrition: Monitor I/O. Check lytes in am.  8. Dyslipidemia: Now on Lipitor.  9. Sleep disturbance: Continue melatonin.  10. HTN: Monitor BP bid with permissive HTN for 3-5 days--currently labile and may need prinzide resumed.  Vitals:   08/03/17 2040 08/04/17 0557  BP: (!) 147/79 (!) 148/86  Pulse: 90 87  Resp: 18 12  Temp: 98.7  F (37.1 C) 98.1 F (36.7 C)  SpO2: 98% 100%  BP remains normal to low, continue to hold medications 11. Thrombocytopenia: Monitor CBC serial and  for signs of bleeding. PLT normal 6/10 12.  Transaminasemia likely secondary to statin but also with elvated bili and AlkP as well   -improving   LOS (Days) 5 A FACE TO FACE EVALUATION WAS PERFORMED  Charlett Blake 08/04/2017, 8:22 AM

## 2017-08-04 NOTE — Progress Notes (Signed)
Occupational Therapy Session Note  Patient Details  Name: Erin Patterson MRN: [id number] Date of Birth: [birth year]  Today's Date: 08/04/2017 OT Individual Time: 2336-1224 OT Individual Time Calculation (min): 73 min    Short Term Goals: Week 1:  OT Short Term Goal 1 (Week 1): Pt will correctly place hands on RW during sit<> stand mod I OT Short Term Goal 2 (Week 1): Pt will use R UE at non-dominant level mod I OT Short Term Goal 3 (Week 1): Pt will manage RW in functional context during ADL session with no more than 2 VCs  Skilled Therapeutic Interventions/Progress Updates:    Pt seen for OT ADL bathing/dressing sesion. Pt in supine upon arrival with interpreter and husband present, hand off from SLP. Pt agreeable to tx session and desiring to shower. She ambulated throughout session with HHA.  She bathed seated on tub bench, required VCs throughout for sequencing of task.  She dressed seated on toilet with steadying assist for dynamic standing balance. Pt demonstrates poor safety awareness and awareness of deficits during functional tasks. She completed grooming tasks from standing position, again requiring cuing for sequencing.  Following seated rest break, she ambulated with RW to ADL apartment, VCs for attention to task and RW management. Pt easily distracted by environmental stimuli.  In apartment, completed simulated tub/shower transfer in simulation of home environment. Education and demonstration provided for use of tub transfer bench, pt return demonstrated with guarding assist. She also completed ambulation to standard bed and EOB <> BSC, recommending use of BSC for night time use at home to decrease risk of fall. Pt's husband present and voiced understanding of recommendation.  She returned to room in same manner as described above. Unable to navigate back to room independently and not self recognizing error without max cuing.  Pt left seated EOB at end of session with family members  present and bed alarm on. Made aware for use of call bell to assist with any transfer.   Therapy Documentation Precautions:  Precautions Precautions: Fall Restrictions Weight Bearing Restrictions: No Pain:   No/denies pain  See Function Navigator for Current Functional Status.   Therapy/Group: Individual Therapy  Shellee Streng L 08/04/2017, 7:08 AM

## 2017-08-04 NOTE — Progress Notes (Signed)
Speech Language Pathology Daily Session Note  Patient Details  Name: Erin Patterson MRN: [id number] Date of Birth: 12-11-1932  Today's Date: 08/04/2017 SLP Individual Time: 6629-4765 SLP Individual Time Calculation (min): 55 min  Short Term Goals: Week 1: SLP Short Term Goal 1 (Week 1): Pt will utilize external aids to recall basic, daily information with mod assist verbal cues.   SLP Short Term Goal 2 (Week 1): Pt will complete basic, familiar tasks with mod assist for functional problem solving.  SLP Short Term Goal 3 (Week 1): Pt will return demonstration of at least 2 safety precautions during functional tasks with mod assist.   Skilled Therapeutic Interventions:Skilled ST services focused on cognitive skills.  Interpret present for treatment session. SLP facilitated use of safety precautions during transferring from bed to RW and from RW to chair, pt required min-mod A verbal cues/visual cues. SLP facilitated safety awareness utilizing picture cards, pt demonstrated ability to identify safe/unsafe situation with mod A verbal cues. SLP facilitated basic problem solving utilizing functional picture cards to sequence 3 pictures, pt required max-mod A verbal cues. SLP facilitated recall of novel information, main idea of picture cards, pt required max A verbal and binary choice cues. Pt was left in room with call bell within reach. Recommend to continue skilled ST services.      Function:  Eating Eating                 Cognition Comprehension Comprehension assist level: Follows basic conversation/direction with no assist  Expression   Expression assist level: Expresses basic 50 - 74% of the time/requires cueing 25 - 49% of the time. Needs to repeat parts of sentences.  Social Interaction Social Interaction assist level: Interacts appropriately 50 - 74% of the time - May be physically or verbally inappropriate.  Problem Solving Problem solving assist level: Solves basic 50 - 74% of the  time/requires cueing 25 - 49% of the time;Solves basic 25 - 49% of the time - needs direction more than half the time to initiate, plan or complete simple activities  Memory Memory assist level: Recognizes or recalls 25 - 49% of the time/requires cueing 50 - 75% of the time    Pain Pain Assessment Pain Score: 0-No pain  Therapy/Group: Individual Therapy  Erin Patterson  [facility] 08/04/2017, 12:46 PM

## 2017-08-04 NOTE — Progress Notes (Signed)
Physical Therapy Session Note  Patient Details  Name: Erin Patterson MRN: [id number] Date of Birth: 12-16-1932  Today's Date: 08/04/2017 PT Individual Time: 1478-2956 PT Individual Time Calculation (min): 56 min   Short Term Goals: Week 1:  PT Short Term Goal 1 (Week 1): Pt will complete transfers with min A consistently PT Short Term Goal 2 (Week 1): Pt will ambulate x 150 ft with RW and min A PT Short Term Goal 3 (Week 1): Pt will ascend/descend 4 stairs with 2 handrails and min A  Skilled Therapeutic Interventions/Progress Updates:  Pt received in bed & no c/o pain reported. Professional interpreter & husband present for session. Pt has tennis shoes in room and requires assistance to loosen laces for increased ease of donning shoes while wearing grip socks. Pt with difficulty tying shoes as she normally wears slip ons (educated her on need to use tennis shoes & foot up brace at all times upon d/c). Pt ambulates room>gym with RW & close supervision with shuffled gait pattern, absent heel strike and minimal toe clearance BLE, with ongoing cuing to ambulate within base of RW. Provided pt with R foot up brace and educated pt on donning brace (therapist placed piece in shoe, pt clipped 2 pieces together). Gait x 100 ft with RW, R foot up & supervision with cuing for increased toe clearance & pt able to demonstrate heel strike BLE with cuing. Educated pt on need to wear foot-up brace any time she is ambulating, even very short distances, as well as educated pt's husband on positioning and cuing to give pt when ambulating at home. Also recommended they ensure pathways are clear to prevent tripping hazards. Pt's husband provided supervision while pt ambulated but he reports he ambulates slow and has difficulty keeping up with pt; therapist also had to provide pt with ongoing cuing for increased foot clearance as pt's husband unable.  Provided them with gait belt and pt able to don it (husband unable 2/2 LUE  amputation) and husband able to hold pt while ambulating but he experiences LOB himself. Notified LCSW Valentina Gu) that it would be beneficial for pt's daughter to take some time off work to assist pt at home, as pt's husband is not well himself. At end of session pt left in bed with alarm set, husband present, needs within reach.   Therapy Documentation Precautions:  Precautions Precautions: Fall Restrictions Weight Bearing Restrictions: No   See Function Navigator for Current Functional Status.   Therapy/Group: Individual Therapy  Sandi Mariscal 08/04/2017, 3:00 PM

## 2017-08-05 ENCOUNTER — Inpatient Hospital Stay (HOSPITAL_COMMUNITY): Payer: Medicaid Other | Admitting: Physical Therapy

## 2017-08-05 ENCOUNTER — Inpatient Hospital Stay (HOSPITAL_COMMUNITY): Payer: Medicaid Other | Admitting: Occupational Therapy

## 2017-08-05 ENCOUNTER — Inpatient Hospital Stay (HOSPITAL_COMMUNITY): Payer: Medicaid Other

## 2017-08-05 MED ORDER — ATORVASTATIN CALCIUM 40 MG PO TABS: 40.0000 mg | ORAL_TABLET | Freq: Every day | ORAL | Status: DC

## 2017-08-05 MED ORDER — LISINOPRIL 5 MG PO TABS
5.0000 mg | ORAL_TABLET | Freq: Every day | ORAL | Status: DC
  Administered 2017-08-05 – 2017-08-08 (×4): 5 mg via ORAL
  Filled 2017-08-05 (×4): qty 1

## 2017-08-05 NOTE — Progress Notes (Signed)
Physical Therapy Session Note  Patient Details  Name: Erin Patterson MRN: [id number] Date of Birth: 09-25-1932  Today's Date: 08/05/2017 PT Individual Time: 1300-1415 PT Individual Time Calculation (min): 75 min   Short Term Goals: Week 1:  PT Short Term Goal 1 (Week 1): Pt will complete transfers with min A consistently PT Short Term Goal 2 (Week 1): Pt will ambulate x 150 ft with RW and min A PT Short Term Goal 3 (Week 1): Pt will ascend/descend 4 stairs with 2 handrails and min A  Skilled Therapeutic Interventions/Progress Updates: Pt received seated on EOB with husband present; interpreter arrived approx 10 min later. Donned R shoe totalA for assist with orthosis, LLE donned with S, increased time. Gait in/out of bathroom with RW and min guard. Performed clothing management and hygiene with S. W/c propulsion with BUE x150' with S for focus on BUE coordination and activity tolerance. Stand pivot transfer w/c>mat with no AD and S. Standing alternating toe taps to 4" step with minA initially, progressed to min guard, RUE over therapist's shoulder, progressed to HHA, then no UE support. Side steps R/L over trekking pole and forward/backward over trekking pole for focus on LE coordination, L weight shifting to improve R foot clearance. Lateral steps ups RLE 2x13 reps in parallel bars to 6" step with min guard, cues for full hip/knee extension prior to bringing LLE up to step. Progressive balance exercises in parallel bars on airex foam pad including normal BOS, feet together, staggered stance and on level floor tandem all performed BLE with goal of total 30 sec; requires increased use of UEs on bars to regain balance with smaller BOS, however able to hold all up to 15 sec with exception of tandem on level floor. Pt reports increased L ankle pain with tandem stance. Ankle observed to be red/edematous above medial malleoli; primary team aware. Applied ace wrap to L ankle for compression/stability; educated  pt in use during the day, take off at night and RN alerted. Gait to return to room R HHA with cues for decreasing R foot drag, recalling L weight shift utilized with step taps and pt demos increased foot clearance, R/L weight shifting, requires cues during turns to maintain. Returned to supine in bed with S. Applied ice pack to L ankle over acewrap/nonskid sock and instructed pt to alert staff if too cold. Remained in bed, alarm intact, all needs in reach.      Therapy Documentation Precautions:  Precautions Precautions: Fall Restrictions Weight Bearing Restrictions: No Pain: Pain Assessment Pain Scale: 0-10 Pain Score: 4  Pain Type: Acute pain Pain Location: Ankle Pain Orientation: Left Pain Descriptors / Indicators: Aching Pain Frequency: Intermittent Pain Onset: Sudden Patients Stated Pain Goal: 1 Pain Intervention(s): Medication (See eMAR)  See Function Navigator for Current Functional Status.   Therapy/Group: Individual Therapy  Vista Lawman 08/05/2017, 2:59 PM

## 2017-08-05 NOTE — Progress Notes (Signed)
Subjective/Complaints:  No interpreter, sitting in bed eating breakfast independently  ROS: limited language  Objective: Vital Signs: Blood pressure (!) 157/100, pulse 94, temperature 97.9 F (36.6 C), temperature source Oral, resp. rate 17, height 5' 4"  (1.626 m), weight 52.6 kg (115 lb 15.4 oz), SpO2 99 %. No results found. Results for orders placed or performed during the hospital encounter of 07/30/17 (from the past 72 hour(s))  Basic metabolic panel     Status: Abnormal   Collection Time: 08/04/17  5:43 AM  Result Value Ref Range   Sodium 138 135 - 145 mmol/L   Potassium 4.3 3.5 - 5.1 mmol/L   Chloride 105 101 - 111 mmol/L   CO2 24 22 - 32 mmol/L   Glucose, Bld 91 65 - 99 mg/dL   BUN 24 (H) 6 - 20 mg/dL   Creatinine, Ser 0.77 0.44 - 1.00 mg/dL   Calcium 8.5 (L) 8.9 - 10.3 mg/dL   GFR calc non Af Amer >60 >60 mL/min   GFR calc Af Amer >60 >60 mL/min    Comment: (NOTE) The eGFR has been calculated using the CKD EPI equation. This calculation has not been validated in all clinical situations. eGFR's persistently <60 mL/min signify possible Chronic Kidney Disease.    Anion gap 9 5 - 15    Comment: Performed at [facility] [address] [place], [place] [address]  CBC     Status: Abnormal   Collection Time: 08/04/17  5:43 AM  Result Value Ref Range   WBC 5.1 4.0 - 10.5 K/uL   RBC 4.55 3.87 - 5.11 MIL/uL   Hemoglobin 11.6 (L) 12.0 - 15.0 g/dL   HCT 35.3 (L) 36.0 - 46.0 %   MCV 77.6 (L) 78.0 - 100.0 fL   MCH 25.5 (L) 26.0 - 34.0 pg   MCHC 32.9 30.0 - 36.0 g/dL   RDW 15.2 11.5 - 15.5 %   Platelets 163 150 - 400 K/uL    Comment: Performed at [facility], [place] [address], [place], [place] [address]  Hepatic function panel     Status: Abnormal   Collection Time: 08/04/17  5:43 AM  Result Value Ref Range   Total Protein 6.6 6.5 - 8.1 g/dL   Albumin 2.8 (L) 3.5 - 5.0 g/dL   AST 93 (H) 15 - 41 U/L   ALT 103 (H) 14 - 54 U/L   Alkaline Phosphatase 133 (H)  38 - 126 U/L   Total Bilirubin 0.6 0.3 - 1.2 mg/dL   Bilirubin, Direct 0.1 0.1 - 0.5 mg/dL   Indirect Bilirubin 0.5 0.3 - 0.9 mg/dL    Comment: Performed at [facility] [address], [place], [place] [address]     Constitutional: No distress . Vital signs reviewed. HEENT: EOMI, oral membranes moist Neck: supple Cardiovascular: RRR without murmur. No JVD    Respiratory: CTA Bilaterally without wheezes or rales. Normal effort    GI: BS +, non-tender, non-distended  Extremity:  No Edema Skin:   Intact Neuro: Other 4/5 on RIght side  HF, KE, ADF, Delt, Bi, Tri,Grip, intact bil finger to thumb Musc/Skel:  Normal and Other no pain with UE or LE ROM, mild tendrness to palpation left medial supramalleolar area Gen NAD   Assessment/Plan: 1. Functional deficits secondary to right hemiparesis From Left thalamic infarct which require 3+ hours per day of interdisciplinary therapy in a comprehensive inpatient rehab setting. Physiatrist is providing close team supervision and 24 hour management  of active medical problems listed below. Physiatrist and rehab team continue to assess barriers to discharge/monitor patient progress toward functional and medical goals. FIM: Function - Bathing Position: Shower Body parts bathed by patient: Right arm, Right lower leg, Left arm, Left lower leg, Chest, Back, Abdomen, Front perineal area, Buttocks, Right upper leg, Left upper leg Assist Level: Supervision or verbal cues  Function- Upper Body Dressing/Undressing What is the patient wearing?: Pull over shirt/dress Pull over shirt/dress - Perfomed by patient: Thread/unthread right sleeve, Thread/unthread left sleeve, Put head through opening, Pull shirt over trunk Assist Level: More than reasonable time Function - Lower Body Dressing/Undressing What is the patient wearing?: Underwear, Pants, Non-skid slipper socks, Shoes Position: Other (comment)(Sitting on toilet) Underwear - Performed by patient:  Thread/unthread right underwear leg, Thread/unthread left underwear leg, Pull underwear up/down Pants- Performed by patient: Thread/unthread right pants leg, Thread/unthread left pants leg, Pull pants up/down, Fasten/unfasten pants Non-skid slipper socks- Performed by patient: Don/doff right sock, Don/doff left sock Shoes - Performed by patient: Don/doff right shoe, Don/doff left shoe, Fasten right, Fasten left Assist for footwear: Supervision/touching assist Assist for lower body dressing: Touching or steadying assistance (Pt > 75%)  Function - Toileting Toileting steps completed by patient: Adjust clothing prior to toileting, Performs perineal hygiene, Adjust clothing after toileting Toileting Assistive Devices: Grab bar or rail Assist level: Supervision or verbal cues  Function Midwife transfer assistive device: Grab bar Assist level to toilet: Supervision or verbal cues Assist level from toilet: Supervision or verbal cues  Function - Chair/bed transfer Chair/bed transfer method: Ambulatory Chair/bed transfer assist level: Supervision or verbal cues Chair/bed transfer assistive device: Walker Chair/bed transfer details: Verbal cues for sequencing, Verbal cues for technique, Verbal cues for precautions/safety, Verbal cues for safe use of DME/AE, Manual facilitation for placement  Function - Locomotion: Wheelchair Will patient use wheelchair at discharge?: No Type: Manual Max wheelchair distance: 100' Assist Level: Touching or steadying assistance (Pt > 75%) Assist Level: Touching or steadying assistance (Pt > 75%) Wheel 150 feet activity did not occur: Safety/medical concerns Turns around,maneuvers to table,bed, and toilet,negotiates 3% grade,maneuvers on rugs and over doorsills: No Function - Locomotion: Ambulation Assistive device: Walker-rolling(R foot up brace) Max distance: 150 ft  Assist level: Supervision or verbal cues Assist level: Supervision or verbal  cues Assist level: Supervision or verbal cues Walk 150 feet activity did not occur: Safety/medical concerns Assist level: Supervision or verbal cues Walk 10 feet on uneven surfaces activity did not occur: Safety/medical concerns  Function - Comprehension Comprehension: Auditory Comprehension assistive device: Other Comprehension assist level: Follows basic conversation/direction with no assist  Function - Expression Expression: Verbal Expression assist level: Expresses basic 50 - 74% of the time/requires cueing 25 - 49% of the time. Needs to repeat parts of sentences.  Function - Social Interaction Social Interaction assist level: Interacts appropriately 50 - 74% of the time - May be physically or verbally inappropriate.  Function - Problem Solving Problem solving assist level: Solves basic 50 - 74% of the time/requires cueing 25 - 49% of the time, Solves basic 25 - 49% of the time - needs direction more than half the time to initiate, plan or complete simple activities  Function - Memory Memory assist level: Recognizes or recalls 25 - 49% of the time/requires cueing 50 - 75% of the time Patient normally able to recall (first 3 days only): That he or she is in a hospital, Staff names and faces, Current season, Location of own  room Medical Problem List and Plan: 1.  Right sided weakness and balance deficits  secondary to bilateral CVA, main fxnl def is from R thalamic infarct   -Continue CIR therapies 2.  DVT Prophylaxis/Anticoagulation: Pharmaceutical: Lovenox 3. Pain Management: Off Meloxicam. Mild Left medial supramalleolar tenderness no sign of infection , topical analgesic ordered  - avoid NSAIDs 4. Mood: LCSW to follow up for evaluation and support.  5. Neuropsych: This patient is not capable of making decisions on herown behalf. 6. Skin/Wound Care: Routine pressure relief measures.  7. Fluids/Electrolytes/Nutrition: Monitor I/O. Check lytes in am.  8. Dyslipidemia: Now on  Lipitor.  9. Sleep disturbance: Continue melatonin.  10. HTN: Monitor BP bid with permissive HTN for 3-5 days--currently labile and may need prinzide resumed.  Vitals:   08/05/17 0556 08/05/17 0600  BP: (!) 169/89 (!) 157/100  Pulse: 83 94  Resp:    Temp:    SpO2:    labile, now increasing, will change lisinopril to 43m 11. Thrombocytopenia: Monitor CBC serial and  for signs of bleeding. PLT normal 6/10 12.  Transaminasemia likely secondary to statin but also with elvated bili and AlkP as well   -improving   LOS (Days) 6 A FACE TO FACE EVALUATION WAS PERFORMED  ACharlett Blake6/12/2017, 7:39 AM

## 2017-08-05 NOTE — Progress Notes (Signed)
Recreational Therapy Session Note  Patient Details  Name: [name] MRN: [id number] Date of Birth: [birth year] Today's Date: 08/05/2017  TR eval deferred as pt is not appropriate for TR services. Keltie Labell 08/05/2017, 12:39 PM

## 2017-08-05 NOTE — Progress Notes (Signed)
Speech Language Pathology Daily Session Note  Patient Details  Name: Erin Patterson MRN: [id number] Date of Birth: Jun 28, 1932  Today's Date: 08/05/2017 SLP Individual Time: 0900-1000 SLP Individual Time Calculation (min): 60 min  Short Term Goals: Week 1: SLP Short Term Goal 1 (Week 1): Pt will utilize external aids to recall basic, daily information with mod assist verbal cues.   SLP Short Term Goal 2 (Week 1): Pt will complete basic, familiar tasks with mod assist for functional problem solving.  SLP Short Term Goal 3 (Week 1): Pt will return demonstration of at least 2 safety precautions during functional tasks with mod assist.   Skilled Therapeutic Interventions:Skilled ST services focused on cognitive skills.  Interpret present for treatment session. SLP facilitated problem solving skills with basic money management, pt demonstrated ability to name coins with min A verbal cues, however unable to count due to lack of education, per pt and pt's husband. SLP facilitated basic problem solving with picture cards, pt demonstrated ability to locate three differences with mod A verbal cues and recall differences with visual aid present with mod A verbal cues. SLP facilitated familiar problem solving with ADLs tasks, pt required min A verbal cues to brush teeth and wash face with washcloth, with set up assistance, however perseverated on face washing tasks,possiblily due to language difference/lack of understanding of directions. SLP reviewed safety protocol with pt, pressing call bell and waiting for help before getting up, pt verbally recalled with min A verbal cues, however demonstrated continued reduced safety awareness when pt got up from bed without call to use bathroom, while SLP when to get visual aid. Per pt, she felt it was ok because her husband was in the room. SLP provided continued education and posted visual aid in room to assist in recall. Pt was left in room with call bell within reach.  Reccomend to continue skilled ST services.       Function:  Eating Eating                 Cognition Comprehension Comprehension assist level: Understands basic 90% of the time/cues < 10% of the time  Expression   Expression assist level: Expresses basic 50 - 74% of the time/requires cueing 25 - 49% of the time. Needs to repeat parts of sentences.  Social Interaction Social Interaction assist level: Interacts appropriately 50 - 74% of the time - May be physically or verbally inappropriate.  Problem Solving Problem solving assist level: Solves basic 50 - 74% of the time/requires cueing 25 - 49% of the time;Solves basic 25 - 49% of the time - needs direction more than half the time to initiate, plan or complete simple activities  Memory Memory assist level: Recognizes or recalls 25 - 49% of the time/requires cueing 50 - 75% of the time;Recognizes or recalls less than 25% of the time/requires cueing greater than 75% of the time    Pain Pain Assessment Pain Score: 0-No pain  Therapy/Group: Individual Therapy  Cung Masterson  [facility] 08/05/2017, 1:57 PM

## 2017-08-05 NOTE — Progress Notes (Signed)
Occupational Therapy Session Note  Patient Details  Name: Erin Patterson MRN: [id number] Date of Birth: 21-Jul-1932  Today's Date: 08/05/2017 OT Individual Time: 1000-1100 OT Individual Time Calculation (min): 60 min    Short Term Goals: Week 1:  OT Short Term Goal 1 (Week 1): Pt will correctly place hands on RW during sit<> stand mod I OT Short Term Goal 2 (Week 1): Pt will use R UE at non-dominant level mod I OT Short Term Goal 3 (Week 1): Pt will manage RW in functional context during ADL session with no more than 2 VCs  Skilled Therapeutic Interventions/Progress Updates:    PT seen for OT ADL bathing/dressing session. PT sitting up in bed upon arrival with interpreter and husband present. PT voicing increased fatigue, however, willing to particpiate in therapy. She was unable to recall any events from SLP session she had just finished.  SLP has posted visual no walking sign in pt's room. Pt able to recall what sign meant, however, required cues for functional implications. She ambulated throughout session with RW, mod cuing and assist for RW management in functional context. She bathed seated on tub bench, VCs for sequencing of task and demonstrating some perseverative tendencies thorughout bathing/dressing activities. She dressed seated on toilet with steadying assist when pulling pants up. Returned to EOB to don shoes. Pt unable to recall technique for donning foot-up brace. Educated pt and husband regarding technique as pt's husband will have to give verbal instructions for donning as he is physically unable to help and pt is not able to carry over new learning. Recommend leaving ankle piece of brace donned to shoe and placing shoe then velcroing straps as pt with difficulty manipulating small latch.  Pt left sitting up in bed at end of session per request, bed alarm on, all needs in reach and husband present.    Therapy Documentation Precautions:  Precautions Precautions:  Fall Restrictions Weight Bearing Restrictions: No Pain: Pain Assessment Pain Scale: 0-10 Pain Score: 0-No pain  See Function Navigator for Current Functional Status.   Therapy/Group: Individual Therapy  Daulton Harbaugh L 08/05/2017, 6:59 AM

## 2017-08-06 ENCOUNTER — Inpatient Hospital Stay (HOSPITAL_COMMUNITY): Payer: Medicaid Other | Admitting: Physical Therapy

## 2017-08-06 ENCOUNTER — Inpatient Hospital Stay (HOSPITAL_COMMUNITY): Payer: Medicaid Other | Admitting: Occupational Therapy

## 2017-08-06 ENCOUNTER — Inpatient Hospital Stay (HOSPITAL_COMMUNITY): Payer: Medicaid Other | Admitting: Speech Pathology

## 2017-08-06 NOTE — Progress Notes (Signed)
Occupational Therapy Session Note  Patient Details  Name: Erin Patterson MRN: [id number] Date of Birth: 06/12/1932  Today's Date: 08/06/2017 OT Individual Time: 0930-1030 OT Individual Time Calculation (min): 60 min    Short Term Goals: Week 1:  OT Short Term Goal 1 (Week 1): Pt will correctly place hands on RW during sit<> stand mod I OT Short Term Goal 2 (Week 1): Pt will use R UE at non-dominant level mod I OT Short Term Goal 3 (Week 1): Pt will manage RW in functional context during ADL session with no more than 2 VCs  Skilled Therapeutic Interventions/Progress Updates:    Pt seen for OT ADl bathing/dressing session with emphasis on family education. Pt sitting up in w/c upon arrival, hand off to SLP. Pt unable to recall what they made during SLP session, only able to recall they mixed something, however, unsure if mixed eggs or powder (Pt made instant pudding in SLP session). Pt desiring to shower this session. Had pt's husband assist with all aspects of bathing/dressing session in prep for upcoming d/c. Pt ambulated throughout session with RW, cuing required from therapist to pt and pt's husband for correct RW management including staying iside walker when ambulating and turning with AD.  Pt bathed seated on BSC< superivison assist provided from pt's husband.  She returned to sitting on otilet to dress, cont to require VC for safety awareness as attempting to gather items and dress from standing position while holding towel around her with no RW, etc.  She completed grooming tasks standing at sink with assist provided from husband.  Following seated rest break, pt ambulated throughout unit with assist from husband, cont with R foot drag, cuing for clearance and weight shift. Pt left seated EOM in therapy gym at end of session awaiting hand off to PT.    Therapy Documentation Precautions:  Precautions Precautions: Fall Restrictions Weight Bearing Restrictions: No Pain:    No/denies  pain  See Function Navigator for Current Functional Status.   Therapy/Group: Individual Therapy  Marriana Hibberd L 08/06/2017, 7:06 AM

## 2017-08-06 NOTE — Progress Notes (Signed)
Subjective/Complaints:  No issues overnite  ROS: limited language  Objective: Vital Signs: Blood pressure 128/74, pulse 88, temperature 98 F (36.7 C), temperature source Oral, resp. rate 16, height 5' 4"  (1.626 m), weight 53 kg (116 lb 13.5 oz), SpO2 97 %. No results found. Results for orders placed or performed during the hospital encounter of 07/30/17 (from the past 72 hour(s))  Basic metabolic panel     Status: Abnormal   Collection Time: 08/04/17  5:43 AM  Result Value Ref Range   Sodium 138 135 - 145 mmol/L   Potassium 4.3 3.5 - 5.1 mmol/L   Chloride 105 101 - 111 mmol/L   CO2 24 22 - 32 mmol/L   Glucose, Bld 91 65 - 99 mg/dL   BUN 24 (H) 6 - 20 mg/dL   Creatinine, Ser 0.77 0.44 - 1.00 mg/dL   Calcium 8.5 (L) 8.9 - 10.3 mg/dL   GFR calc non Af Amer >60 >60 mL/min   GFR calc Af Amer >60 >60 mL/min    Comment: (NOTE) The eGFR has been calculated using the CKD EPI equation. This calculation has not been validated in all clinical situations. eGFR's persistently <60 mL/min signify possible Chronic Kidney Disease.    Anion gap 9 5 - 15    Comment: Performed at [facility] [address], [place], [place] [address]  CBC     Status: Abnormal   Collection Time: 08/04/17  5:43 AM  Result Value Ref Range   WBC 5.1 4.0 - 10.5 K/uL   RBC 4.55 3.87 - 5.11 MIL/uL   Hemoglobin 11.6 (L) 12.0 - 15.0 g/dL   HCT 35.3 (L) 36.0 - 46.0 %   MCV 77.6 (L) 78.0 - 100.0 fL   MCH 25.5 (L) 26.0 - 34.0 pg   MCHC 32.9 30.0 - 36.0 g/dL   RDW 15.2 11.5 - 15.5 %   Platelets 163 150 - 400 K/uL    Comment: Performed at [facility], [place] [address], [place], [place] [address]  Hepatic function panel     Status: Abnormal   Collection Time: 08/04/17  5:43 AM  Result Value Ref Range   Total Protein 6.6 6.5 - 8.1 g/dL   Albumin 2.8 (L) 3.5 - 5.0 g/dL   AST 93 (H) 15 - 41 U/L   ALT 103 (H) 14 - 54 U/L   Alkaline Phosphatase 133 (H) 38 - 126 U/L   Total Bilirubin 0.6 0.3 - 1.2 mg/dL    Bilirubin, Direct 0.1 0.1 - 0.5 mg/dL   Indirect Bilirubin 0.5 0.3 - 0.9 mg/dL    Comment: Performed at [facility] [address], [place], [place] [address]     Constitutional: No distress . Vital signs reviewed. HEENT: EOMI, oral membranes moist Neck: supple Cardiovascular: RRR without murmur. No JVD    Respiratory: CTA Bilaterally without wheezes or rales. Normal effort    GI: BS +, non-tender, non-distended  Extremity:  No Edema Skin:   Intact Neuro: Other 4/5 on RIght side  HF, KE, ADF, Delt, Bi, Tri,Grip, intact bil finger to thumb Musc/Skel:  Normal and Other no pain with UE or LE ROM, mild tendrness to palpation left medial supramalleolar area Gen NAD   Assessment/Plan: 1. Functional deficits secondary to right hemiparesis From Left thalamic infarct which require 3+ hours per day of interdisciplinary therapy in a comprehensive inpatient rehab setting. Physiatrist is providing close team supervision and 24 hour management of active medical problems listed below.  Physiatrist and rehab team continue to assess barriers to discharge/monitor patient progress toward functional and medical goals. FIM: Function - Bathing Position: Shower Body parts bathed by patient: Right arm, Right lower leg, Left arm, Left lower leg, Chest, Back, Abdomen, Front perineal area, Buttocks, Right upper leg, Left upper leg Assist Level: Supervision or verbal cues  Function- Upper Body Dressing/Undressing What is the patient wearing?: Pull over shirt/dress Pull over shirt/dress - Perfomed by patient: Thread/unthread right sleeve, Thread/unthread left sleeve, Put head through opening, Pull shirt over trunk Assist Level: More than reasonable time Function - Lower Body Dressing/Undressing What is the patient wearing?: Underwear, Pants, Non-skid slipper socks, Shoes Position: Other (comment)(Sitting on toilet) Underwear - Performed by patient: Thread/unthread right underwear leg, Thread/unthread  left underwear leg, Pull underwear up/down Pants- Performed by patient: Thread/unthread right pants leg, Thread/unthread left pants leg, Pull pants up/down, Fasten/unfasten pants Non-skid slipper socks- Performed by patient: Don/doff right sock, Don/doff left sock Shoes - Performed by patient: Don/doff right shoe, Don/doff left shoe, Fasten right, Fasten left Assist for footwear: Supervision/touching assist Assist for lower body dressing: Touching or steadying assistance (Pt > 75%)  Function - Toileting Toileting steps completed by patient: Adjust clothing prior to toileting, Performs perineal hygiene, Adjust clothing after toileting Toileting Assistive Devices: Grab bar or rail Assist level: Supervision or verbal cues  Function - Air cabin crew transfer assistive device: Landscape architect, Pension scheme manager level to toilet: Supervision or verbal cues Assist level from toilet: Supervision or verbal cues  Function - Chair/bed transfer Chair/bed transfer method: Ambulatory Chair/bed transfer assist level: Supervision or verbal cues Chair/bed transfer assistive device: Walker Chair/bed transfer details: Verbal cues for sequencing, Verbal cues for technique, Verbal cues for precautions/safety, Verbal cues for safe use of DME/AE, Manual facilitation for placement  Function - Locomotion: Wheelchair Will patient use wheelchair at discharge?: No Type: Manual Max wheelchair distance: 100' Assist Level: Touching or steadying assistance (Pt > 75%) Assist Level: Touching or steadying assistance (Pt > 75%) Wheel 150 feet activity did not occur: Safety/medical concerns Turns around,maneuvers to table,bed, and toilet,negotiates 3% grade,maneuvers on rugs and over doorsills: No Function - Locomotion: Ambulation Assistive device: No device Max distance: 150 Assist level: Touching or steadying assistance (Pt > 75%) Assist level: Touching or steadying assistance (Pt > 75%) Assist level: Touching or  steadying assistance (Pt > 75%) Walk 150 feet activity did not occur: Safety/medical concerns Assist level: Touching or steadying assistance (Pt > 75%) Walk 10 feet on uneven surfaces activity did not occur: Safety/medical concerns  Function - Comprehension Comprehension: Auditory Comprehension assistive device: Other Comprehension assist level: Understands basic 90% of the time/cues < 10% of the time  Function - Expression Expression: Verbal Expression assist level: Expresses basic 50 - 74% of the time/requires cueing 25 - 49% of the time. Needs to repeat parts of sentences.  Function - Social Interaction Social Interaction assist level: Interacts appropriately 50 - 74% of the time - May be physically or verbally inappropriate.  Function - Problem Solving Problem solving assist level: Solves basic 50 - 74% of the time/requires cueing 25 - 49% of the time, Solves basic 25 - 49% of the time - needs direction more than half the time to initiate, plan or complete simple activities  Function - Memory Memory assist level: Recognizes or recalls 25 - 49% of the time/requires cueing 50 - 75% of the time, Recognizes or recalls less than 25% of the time/requires cueing greater than 75% of the time Patient normally  able to recall (first 3 days only): That he or she is in a hospital, Staff names and faces, Current season, Location of own room Medical Problem List and Plan: 1.  Right sided weakness and balance deficits  secondary to bilateral CVA, main fxnl def is from R thalamic infarct   -Team conference today please see physician documentation under team conference tab, met with team face-to-face to discuss problems,progress, and goals. Formulized individual treatment plan based on medical history, underlying problem and comorbidities. 2.  DVT Prophylaxis/Anticoagulation: Pharmaceutical: Lovenox 3. Pain Management: Off Meloxicam. Mild Left medial supramalleolar tenderness no sign of infection ,  topical analgesic ordered  - avoid NSAIDs 4. Mood: LCSW to follow up for evaluation and support.  5. Neuropsych: This patient is not capable of making decisions on herown behalf. 6. Skin/Wound Care: Routine pressure relief measures.  7. Fluids/Electrolytes/Nutrition: Monitor I/O. Check lytes in am.  8. Dyslipidemia: Now on Lipitor.  9. Sleep disturbance: Continue melatonin.  10. HTN: Monitor BP bid with permissive HTN for 3-5 days--currently labile and may need prinzide resumed.  Vitals:   08/05/17 1952 08/06/17 0412  BP: (!) 147/72 128/74  Pulse: 82 88  Resp: 16 16  Temp: 98 F (36.7 C) 98 F (36.7 C)  SpO2: 99% 97%  labile, now increasing, will change lisinopril to 94m 11. Thrombocytopenia: Monitor CBC serial and  for signs of bleeding. PLT normal 6/10 12.  Transaminasemia likely secondary to statin but also with elvated bili and AlkP as well   -improving 13.  Hypoalbuminemia Start pro stat  LOS (Days) 7 A FACE TO FACE EVALUATION WAS PERFORMED  ACharlett Blake6/01/2018, 7:52 AM

## 2017-08-06 NOTE — Progress Notes (Signed)
Speech Language Pathology Daily Session Note  Patient Details  Name: Erin Patterson MRN: [id number] Date of Birth: 02-Apr-1932  Today's Date: 08/06/2017 SLP Individual Time: 0900-0930 SLP Individual Time Calculation (min): 30 min  Short Term Goals: Week 1: SLP Short Term Goal 1 (Week 1): Pt will utilize external aids to recall basic, daily information with mod assist verbal cues.   SLP Short Term Goal 2 (Week 1): Pt will complete basic, familiar tasks with mod assist for functional problem solving.  SLP Short Term Goal 3 (Week 1): Pt will return demonstration of at least 2 safety precautions during functional tasks with mod assist.   Skilled Therapeutic Interventions: Skilled treatment session focused on cognitive goals. SLP facilitated session by providing overall Mod A verbal cues for problem solving in regards to donning shoes with ankle brace and during a basic kitchen task of preparing instant pudding. Patient also required total A for orientation. Patient left upright in wheelchair with quick release belt in place, alarm on, and all needs within reach. Continue with current plan of care.      Function:   Cognition Comprehension Comprehension assist level: Understands basic 90% of the time/cues < 10% of the time  Expression   Expression assist level: Expresses basic 50 - 74% of the time/requires cueing 25 - 49% of the time. Needs to repeat parts of sentences.  Social Interaction Social Interaction assist level: Interacts appropriately 50 - 74% of the time - May be physically or verbally inappropriate.  Problem Solving Problem solving assist level: Solves basic 25 - 49% of the time - needs direction more than half the time to initiate, plan or complete simple activities  Memory Memory assist level: Recognizes or recalls less than 25% of the time/requires cueing greater than 75% of the time    Pain Pain Assessment Pain Score: 0-No pain Faces Pain Scale: No hurt  Therapy/Group:  Individual Therapy  Ashton Belote 08/06/2017, 9:51 AM

## 2017-08-06 NOTE — Progress Notes (Signed)
Physical Therapy Discharge Summary  Patient Details  Name: Erin Patterson MRN: [id number] Date of Birth: 1932/08/26  Today's Date: 08/07/2017 PT Individual Time: 0900-1000 PT Individual Time Calculation (min): 60 min   Patient has met 7 of 7 long term goals due to improved activity tolerance, improved balance, increased strength and improved attention.  Patient to discharge at an ambulatory level Supervision with RW.   This therapist does not believe her husband can provide the necessary cognitive & physical assistance upon d/c as he seems to have difficulty himself.  The LCSW has been notified of pt's need for pt's daughter to come in for family training to ensure safety upon d/c. Patient's daughter was not present during therapy sessions today for necessary training and even though husband completed necessary training it is questionable as to whether he can provide safe assistance to the patient at home.  Reasons goals not met: Pt has met all rehab goals  Recommendation:  Patient will benefit from ongoing skilled PT services in home health setting to continue to advance safe functional mobility, address ongoing impairments in decreased safety awareness, impaired cognition, decreased balance, impaired strength & endurance, decreased activity tolerance, increase safety with gait with LRAD, and minimize fall risk.  Equipment: RW  Reasons for discharge: treatment goals met and discharge from hospital  Patient/family agrees with progress made and goals achieved: Yes   Skilled Intervention: Pt received supine in bed, agreeable to PT. No complaints of pain. Interpreter present during therapy session. Pt's husband present for family training. Bed mobility independent. Pt is able to don shoes while seated EOB with setup assist. Stand pivot transfer bed to w/c with RW and Supervision, v/c for safe transfer technique and use of RW. Ambulation x 150 ft with RW and Supervision, v/c for heel strike with  RLE as pt refuses to wear "foot-up" brace. Educated pt and husband on importance of RLE clearance with gait to avoid falls. Pt demos improved safety with RW with gait but still needs cues when turning and to sit safely with RW. Car transfer with Supervision, v/c for safe transfer technique. Ascend/descend 12 stairs with L handrail and Supervision assist, husband needs v/c to stay closer to patient when assisting her on stairs. Even though pt's husband is present during therapy session for training he requires frequent cues on how to assist pt to ensure her safety and is not able to ambulate fast enough to keep up with her when she is ambulating with RW. Berg Balance test 27/56, improved from 15/56. Pt left seated in w/c in room with needs in reach.  PT Discharge Precautions/Restrictions Precautions Precautions: Fall Restrictions Weight Bearing Restrictions: No Vision/Perception  Vision - History Baseline Vision: No visual deficits Perception Perception: Within Functional Limits Praxis Praxis: Intact  Cognition Overall Cognitive Status: History of cognitive impairments - at baseline Arousal/Alertness: Awake/alert Orientation Level: Oriented to person Attention: Sustained Sustained Attention: Appears intact Memory: Impaired Memory Impairment: Decreased recall of new information;Decreased short term memory Awareness: Impaired Awareness Impairment: Anticipatory impairment;Emergent impairment;Intellectual impairment Problem Solving: Impaired Behaviors: Impulsive Safety/Judgment: Impaired Comments: Poor safety awareness with decreased awareness of deficits Sensation Sensation Light Touch: Appears Intact Proprioception: Appears Intact Coordination Gross Motor Movements are Fluid and Coordinated: No Coordination and Movement Description: Generalized weakness with mild R hemiplegia with R foot drag Heel Shin Test: impaired B Motor  Motor Motor: Hemiplegia Motor - Skilled Clinical  Observations: Mild R hemiplegia Motor - Discharge Observations: mild R hemiplegia, generalized weakness  Mobility Bed Mobility  Bed Mobility: Rolling Right;Rolling Left;Supine to Sit;Sit to Supine Rolling Right: Independent Rolling Left: Independent Supine to Sit: Independent Sit to Supine: Independent Transfers Sit to Stand: Supervision/Verbal cueing Stand Pivot Transfers: Supervision/Verbal cueing Stand Pivot Transfer Details: Verbal cues for precautions/safety;Verbal cues for safe use of DME/AE Transfer (Assistive device): Rolling walker Locomotion  Gait Ambulation: Yes Gait Assistance: Supervision/Verbal cueing Gait Distance (Feet): 150 Feet Assistive device: Rolling walker Gait Assistance Details: Verbal cues for technique;Verbal cues for precautions/safety Gait Gait: Yes Gait Pattern: Impaired Gait Pattern: Decreased step length - left;Decreased step length - right;Decreased dorsiflexion - right;Decreased dorsiflexion - left;Decreased stride length Gait velocity: decreased Stairs / Additional Locomotion Stairs: Yes Stairs Assistance: Supervision/Verbal cueing Stair Management Technique: One rail Left Number of Stairs: 12 Height of Stairs: 6  Trunk/Postural Assessment  Cervical Assessment Cervical Assessment: Within Functional Limits Thoracic Assessment Thoracic Assessment: Within Functional Limits Lumbar Assessment Lumbar Assessment: Within Functional Limits Postural Control Postural Control: Within Functional Limits  Balance Balance Balance Assessed: Yes Standardized Balance Assessment Standardized Balance Assessment: Berg Balance Test Berg Balance Test Sit to Stand: Able to stand  independently using hands Standing Unsupported: Able to stand 2 minutes with supervision Sitting with Back Unsupported but Feet Supported on Floor or Stool: Able to sit safely and securely 2 minutes Stand to Sit: Controls descent by using hands Transfers: Able to transfer with  verbal cueing and /or supervision Standing Unsupported with Eyes Closed: Able to stand 10 seconds with supervision Standing Ubsupported with Feet Together: Able to place feet together independently and stand for 1 minute with supervision From Standing, Reach Forward with Outstretched Arm: Loses balance while trying/requires external support From Standing Position, Pick up Object from Floor: Unable to try/needs assist to keep balance From Standing Position, Turn to Look Behind Over each Shoulder: Needs supervision when turning Turn 360 Degrees: Needs close supervision or verbal cueing Standing Unsupported, Alternately Place Feet on Step/Stool: Able to complete >2 steps/needs minimal assist Standing Unsupported, One Foot in Front: Able to take small step independently and hold 30 seconds Standing on One Leg: Tries to lift leg/unable to hold 3 seconds but remains standing independently Total Score: 27 Dynamic Sitting Balance Dynamic Sitting - Balance Support: During functional activity;Feet supported Dynamic Sitting - Level of Assistance: 7: Independent Sitting balance - Comments: Sitting EOB to don shoes Static Standing Balance Static Standing - Balance Support: During functional activity;Bilateral upper extremity supported Static Standing - Level of Assistance: 6: Modified independent (Device/Increase time) Dynamic Standing Balance Dynamic Standing - Balance Support: During functional activity;Bilateral upper extremity supported Dynamic Standing - Level of Assistance: 5: Stand by assistance Dynamic Standing - Balance Activities: Reaching for objects Extremity Assessment   RLE Assessment RLE Assessment: Exceptions to [facility] Passive Range of Motion (PROM) Comments: WFL Active Range of Motion (AROM) Comments: WFL General Strength Comments: 4/5 hip flex, knee flex/ext, ankle DF LLE Assessment LLE Assessment: Exceptions to [facility] Passive Range of Motion (PROM) Comments: WFL Active Range of Motion  (AROM) Comments: WFL General Strength Comments: 3+/5 hip flex, 4/5 knee flex/ext, ankle DF   See Function Navigator for Current Functional Status.   Excell Seltzer, PT, DPT 08/07/2017, 12:20 PM  Lavone Nian, PT, DPT 08/06/17, 3:02 PM

## 2017-08-06 NOTE — Progress Notes (Addendum)
Physical Therapy Session Note  Patient Details  Name: Erin Patterson MRN: [id number] Date of Birth: 08-22-1932  Today's Date: 08/06/2017 PT Individual Time: 9357-0177 PT Individual Time Calculation (min): 68 min   Short Term Goals: Week 1:  PT Short Term Goal 1 (Week 1): Pt will complete transfers with min A consistently PT Short Term Goal 2 (Week 1): Pt will ambulate x 150 ft with RW and min A PT Short Term Goal 3 (Week 1): Pt will ascend/descend 4 stairs with 2 handrails and min A  Skilled Therapeutic Interventions/Progress Updates:  Pt received in bed & agreeable to tx. Professional interpreter present, no c/o pain reported. Pt impulsive and with reduced safety awareness throughout session, attempting to ambulate to walker, getting up without assistance with ongoing education from therapist. Pt ambulates around unit with RW & supervision with cuing for upright posture, increase heel strike, and to ambulate within base of RW. Pt completes car transfer at sedan simulated height (her daughter drives a car) with RW & supervision with max cuing for sequencing. Pt negotiates 8 steps with BUE supported on L rail and close supervision with pt demonstrating decreased foot clearance RLE. Pt with decreased heel strike during gait and decreased RLE foot clearance with stair negotiation therefore trialed R foot-up brace again. Pt requires supervision<>min assist to don/tie tennis shoes, therapist applies shoe portion of foot up brace but pt dons ankle portion and attaches the two with heavy cuing. Gait with foot up brace and pt initially demonstrating increased foot clearance but then reverts back to shuffled gait pattern. Pt reports she will not use brace at home - educated pt on increased fall risk. Pt appears to be limited by cognitive deficits (both pre existing and new), with poor carryover of education and instructions. Pt completed standing step taps on 6" step with RUE support progressing to no UE support  with min assist with task focusing on weight shifting L<>R, RLE hip/knee flexion & strengthening. Pt returned to room & left in bed with alarm set and all needs in reach.   Addendum: Notified LCSW Boneta Lucks) that it would be beneficial for pt's daughter to come in for training as this therapist does not believe pt's husband can provide necessary cognitive & physical assistance upon d/c.   Pt completes bed mobility in apartment bed with supervision and extra time.  Therapy Documentation Precautions:  Precautions Precautions: Fall Restrictions Weight Bearing Restrictions: No   See Function Navigator for Current Functional Status.   Therapy/Group: Individual Therapy  Sandi Mariscal 08/06/2017, 2:51 PM

## 2017-08-06 NOTE — Plan of Care (Signed)
  Problem: RH SAFETY Goal: RH STG ADHERE TO SAFETY PRECAUTIONS W/ASSISTANCE/DEVICE Description STG Adhere to Safety Precautions With  Min Assistance/Device.  Outcome: Progressing  Bed alarm, call light at reach, proper footwear

## 2017-08-07 ENCOUNTER — Inpatient Hospital Stay (HOSPITAL_COMMUNITY): Payer: Medicaid Other | Admitting: Physical Therapy

## 2017-08-07 ENCOUNTER — Inpatient Hospital Stay (HOSPITAL_COMMUNITY): Payer: Medicaid Other | Admitting: Occupational Therapy

## 2017-08-07 ENCOUNTER — Inpatient Hospital Stay (HOSPITAL_COMMUNITY): Payer: Medicaid Other | Admitting: Speech Pathology

## 2017-08-07 LAB — CBC
HEMATOCRIT: 36.3 % (ref 36.0–46.0)
HEMOGLOBIN: 11.9 g/dL — AB (ref 12.0–15.0)
MCH: 25.5 pg — ABNORMAL LOW (ref 26.0–34.0)
MCHC: 32.8 g/dL (ref 30.0–36.0)
MCV: 77.7 fL — ABNORMAL LOW (ref 78.0–100.0)
Platelets: 232 10*3/uL (ref 150–400)
RBC: 4.67 MIL/uL (ref 3.87–5.11)
RDW: 15.1 % (ref 11.5–15.5)
WBC: 4.8 10*3/uL (ref 4.0–10.5)

## 2017-08-07 NOTE — Progress Notes (Signed)
Occupational Therapy Session Note  Patient Details  Name: Erin Patterson MRN: [id number] Date of Birth: 1933-01-24  Today's Date: 08/07/2017 OT Individual Time: 1330-1415 OT Individual Time Calculation (min): 45 min    Short Term Goals: Week 1:  OT Short Term Goal 1 (Week 1): Pt will correctly place hands on RW during sit<> stand mod I OT Short Term Goal 2 (Week 1): Pt will use R UE at non-dominant level mod I OT Short Term Goal 3 (Week 1): Pt will manage RW in functional context during ADL session with no more than 2 VCs Week 2:    Week 3:     Skilled Therapeutic Interventions/Progress Updates:    1:1 Pt's equipment has not arrived yet but did discuss safety with DME and safety recommendations for home. Discussed RW safety. Pt's husband reports feeling good and ready for tub bench transfers at home.   Pt performed furniture transfers in ADL apartment with supervision with RW to navigate around simulated apartment.  Also perform obstacle course; navigating around obstacles (narrow passageways), stepping over thresholds and steps with close supervision. Performed these tasks with and without RW to challenge balance and RW safety. Pt perform 3 min on Nustep on level 3 for endurance and activity tolerance. REturned to room and transferred into bed with supervision.  Therapy Documentation Precautions:  Precautions Precautions: Fall Restrictions Weight Bearing Restrictions: No Pain:  no c/o pain  See Function Navigator for Current Functional Status.   Therapy/Group: Individual Therapy  Roney Mans [facility] 08/07/2017, 2:21 PM

## 2017-08-07 NOTE — Progress Notes (Signed)
Occupational Therapy Session Note  Patient Details  Name: Erin Patterson MRN: [id number] Date of Birth: Jun 10, 1932  Today's Date: 08/07/2017 OT Individual Time: 1000-1057 OT Individual Time Calculation (min): 57 min    Short Term Goals: Week 1:  OT Short Term Goal 1 (Week 1): Pt will correctly place hands on RW during sit<> stand mod I OT Short Term Goal 2 (Week 1): Pt will use R UE at non-dominant level mod I OT Short Term Goal 3 (Week 1): Pt will manage RW in functional context during ADL session with no more than 2 VCs  Skilled Therapeutic Interventions/Progress Updates:    Pt seen for OT ADL bathing/dressing session and for family education. Pt sitting up in w/c upon arrival with husband and interpreter present. Pt agreeable to tx session, denying pain and desiring to shower. Pt's husband provided supervision assist and VCs throughout session as part of family ed in prep for d/c home tomorrow. She ambulated throughout session with RW, requiring mod cuing for RW management in functional context.  She bathed seated on tub bench, cuing for sequencing of basic ADLs. She dressed seated on tub bench with overall supervision, VCs for safety awareness as aattempting to complete dressing from standing position. Grooming tasks completed standing at sink with distant supervision. She ambulated throughout unit with superviison using RW demonstrating improved RW mangaement, attention to task and R foot clearance. Completed 9 hole peg test, see results below. Pt returned to room at end of session, left sitting up in w/c with QRB donned and chair alarm on. All needs in reach.  9 hole peg test:  R: 48.5 sec and 40 sec L: 35.25 sec and 36.70 sec  Therapy Documentation Precautions:  Precautions Precautions: Fall Restrictions Weight Bearing Restrictions: No Pain:   No/denies pain  See Function Navigator for Current Functional Status.   Therapy/Group: Individual Therapy  Nilesh Stegall  L 08/07/2017, 6:58 AM

## 2017-08-07 NOTE — Progress Notes (Signed)
Speech Language Pathology Discharge Summary  Patient Details  Name: Erin Patterson MRN: [id number] Date of Birth: 07-04-1932  Today's Date: 08/07/2017 SLP Individual Time: 3112-1624 SLP Individual Time Calculation (min): 22 min   Skilled Therapeutic Interventions:  Pt was seen for skilled ST targeting cognitive goals.  Pt was oriented to place independently and could reorient to situation with mod assist/choice of two.  Pt could recall therapist's recommendations for general safety recommendations (sitting down to shower, use a walker, pick up foot when walking) with mod assist question cues but needed max assist to recall hospital safety precautions.  Pt could return demonstration of call bell but endorses that her husband is always with her and he just helps her when she needs anything.  SLP reviewed and reinforced teams recommendations that pt have 24/7 supervision at discharge.  All questions were answered to pt's and husband's satisfaction at this time.  Pt is scheduled for discharge tomorrow.      Patient has met 1 of 3 long term goals.  Patient to discharge at overall   level.  Reasons goals not met: pt mod assist for recall and problem solving    Clinical Impression/Discharge Summary:   Pt has made functional gains while inpatient and is discharging  having met 1 out of 3 long term goals.  Pt is currently mod for tasks due to moderately severe cognitive deficits characterized by impaired storage and retrieval of daily information, decreased safety awareness, decreased awareness of deficits, and decreased functional problem solving.   Pt/family education is complete at this time.  Pt has demonstrated improved intellectual awareness of her deficits.  Pt is discharging home with recommendations for ST follow up at next level of care in addition to 24/7 supervision in the home environment.     Care Partner:  Caregiver Able to Provide Assistance: Yes  Type of Caregiver Assistance:  Physical;Cognitive  Recommendation:  Home Health SLP;24 hour supervision/assistance  Rationale for SLP Follow Up: Maximize cognitive function and independence;Reduce caregiver burden   Equipment: none recommended by SLP    Reasons for discharge: Discharged from hospital   Patient/Family Agrees with Progress Made and Goals Achieved: Yes   Function:  Eating Eating   Modified Consistency Diet: No Eating Assist Level: No help, No cues           Cognition Comprehension Comprehension assist level: Understands basic 90% of the time/cues < 10% of the time  Expression   Expression assist level: Expresses basic 75 - 89% of the time/requires cueing 10 - 24% of the time. Needs helper to occlude trach/needs to repeat words.  Social Interaction Social Interaction assist level: Interacts appropriately 50 - 74% of the time - May be physically or verbally inappropriate.  Problem Solving Problem solving assist level: Solves basic 50 - 74% of the time/requires cueing 25 - 49% of the time  Memory Memory assist level: Recognizes or recalls 25 - 49% of the time/requires cueing 50 - 75% of the time   Emilio Math 08/07/2017, 2:48 PM

## 2017-08-07 NOTE — Progress Notes (Signed)
Subjective/Complaints:  Sitting up, applied ben gay to knees  ROS: limited language  Objective: Vital Signs: Blood pressure 120/65, pulse 87, temperature 98 F (36.7 C), temperature source Oral, resp. rate 16, height 5\' 4"  (1.626 m), weight 53.1 kg (117 lb 1 oz), SpO2 98 %. No results found. Results for orders placed or performed during the hospital encounter of 07/30/17 (from the past 72 hour(s))  CBC     Status: Abnormal   Collection Time: 08/07/17  5:16 AM  Result Value Ref Range   WBC 4.8 4.0 - 10.5 K/uL   RBC 4.67 3.87 - 5.11 MIL/uL   Hemoglobin 11.9 (L) 12.0 - 15.0 g/dL   HCT 88.5 02.7 - 74.1 %   MCV 77.7 (L) 78.0 - 100.0 fL   MCH 25.5 (L) 26.0 - 34.0 pg   MCHC 32.8 30.0 - 36.0 g/dL   RDW 28.7 86.7 - 67.2 %   Platelets 232 150 - 400 K/uL    Comment: Performed at [facility], [address] [place], [place] [address]     Constitutional: No distress . Vital signs reviewed. HEENT: EOMI, oral membranes moist Neck: supple Cardiovascular: RRR without murmur. No JVD    Respiratory: CTA Bilaterally without wheezes or rales. Normal effort    GI: BS +, non-tender, non-distended  Extremity:  No Edema Skin:   Intact Neuro: Other 4/5 on RIght side  HF, KE, ADF, Delt, Bi, Tri,Grip, intact bil finger to thumb Musc/Skel:  Normal and Other no pain with UE or LE ROM, mild tendrness to palpation left medial supramalleolar area Gen NAD   Assessment/Plan: 1. Functional deficits secondary to right hemiparesis From Left thalamic infarct which require 3+ hours per day of interdisciplinary therapy in a comprehensive inpatient rehab setting. Physiatrist is providing close team supervision and 24 hour management of active medical problems listed below. Physiatrist and rehab team continue to assess barriers to discharge/monitor patient progress toward functional and medical goals. FIM: Function - Bathing Position: Shower Body parts bathed by patient: Right arm, Right lower leg,  Left arm, Left lower leg, Chest, Back, Abdomen, Front perineal area, Buttocks, Right upper leg, Left upper leg Assist Level: Supervision or verbal cues  Function- Upper Body Dressing/Undressing What is the patient wearing?: Pull over shirt/dress Pull over shirt/dress - Perfomed by patient: Thread/unthread right sleeve, Thread/unthread left sleeve, Put head through opening, Pull shirt over trunk Assist Level: Set up Set up : To obtain clothing/put away Function - Lower Body Dressing/Undressing What is the patient wearing?: Underwear, Pants, Non-skid slipper socks, Shoes Position: Wheelchair/chair at sink Underwear - Performed by patient: Thread/unthread right underwear leg, Thread/unthread left underwear leg, Pull underwear up/down Pants- Performed by patient: Thread/unthread right pants leg, Thread/unthread left pants leg, Pull pants up/down, Fasten/unfasten pants Non-skid slipper socks- Performed by patient: Don/doff right sock, Don/doff left sock Shoes - Performed by patient: Don/doff right shoe, Don/doff left shoe, Fasten right, Fasten left Assist for footwear: Supervision/touching assist Assist for lower body dressing: Supervision or verbal cues  Function - Toileting Toileting steps completed by patient: Adjust clothing prior to toileting, Performs perineal hygiene, Adjust clothing after toileting Toileting Assistive Devices: Grab bar or rail Assist level: Supervision or verbal cues  Function - Archivist transfer assistive device: Grab bar, Walker Assist level to toilet: Supervision or verbal cues Assist level from toilet: Supervision or verbal cues  Function - Chair/bed transfer Chair/bed transfer method: Ambulatory Chair/bed transfer assist level: Supervision or verbal cues Chair/bed transfer assistive device: Environmental consultant  Chair/bed transfer details: Verbal cues for sequencing, Verbal cues for technique, Verbal cues for precautions/safety, Verbal cues for safe use of  DME/AE, Manual facilitation for placement  Function - Locomotion: Wheelchair Will patient use wheelchair at discharge?: No Type: Manual Max wheelchair distance: 100' Assist Level: Touching or steadying assistance (Pt > 75%) Assist Level: Touching or steadying assistance (Pt > 75%) Wheel 150 feet activity did not occur: Safety/medical concerns Turns around,maneuvers to table,bed, and toilet,negotiates 3% grade,maneuvers on rugs and over doorsills: No Function - Locomotion: Ambulation Assistive device: Walker-rolling Max distance: 150 ft  Assist level: Supervision or verbal cues Assist level: Supervision or verbal cues Assist level: Supervision or verbal cues Walk 150 feet activity did not occur: Safety/medical concerns Assist level: Supervision or verbal cues Walk 10 feet on uneven surfaces activity did not occur: Safety/medical concerns  Function - Comprehension Comprehension: Auditory Comprehension assistive device: Other Comprehension assist level: Understands basic 90% of the time/cues < 10% of the time  Function - Expression Expression: Verbal Expression assist level: Expresses basic 50 - 74% of the time/requires cueing 25 - 49% of the time. Needs to repeat parts of sentences.  Function - Social Interaction Social Interaction assist level: Interacts appropriately 50 - 74% of the time - May be physically or verbally inappropriate.  Function - Problem Solving Problem solving assist level: Solves basic 25 - 49% of the time - needs direction more than half the time to initiate, plan or complete simple activities  Function - Memory Memory assist level: Recognizes or recalls less than 25% of the time/requires cueing greater than 75% of the time Patient normally able to recall (first 3 days only): That he or she is in a hospital, Staff names and faces, Current season, Location of own room Medical Problem List and Plan: 1.  Right sided weakness and balance deficits  secondary to  bilateral CVA, main fxnl def is from R thalamic infarct   Pt and family seemed to understand tomorrow go home 2.  DVT Prophylaxis/Anticoagulation: Pharmaceutical: Lovenox 3. Pain Management: Off Meloxicam. topical analgesic ordered  - avoid NSAIDs 4. Mood: LCSW to follow up for evaluation and support.  5. Neuropsych: This patient is not capable of making decisions on herown behalf. 6. Skin/Wound Care: Routine pressure relief measures.  7. Fluids/Electrolytes/Nutrition: Monitor I/O. Check lytes in am.  8. Dyslipidemia: Now on Lipitor.  9. Sleep disturbance: Continue melatonin.  10. HTN: Monitor BP bid  Vitals:   08/07/17 0749 08/07/17 0751  BP: 120/65 120/65  Pulse:  87  Resp:    Temp:    SpO2:  98%  Controlled on lisinopril to 5mg  11. Thrombocytopenia:Resolved, normal plt 6/13   12.  Transaminasemia likely secondary to statin but also with elvated bili and AlkP as well   -improving 13.  Hypoalbuminemia Start pro stat  LOS (Days) 8 A FACE TO FACE EVALUATION WAS PERFORMED  Erick Colace 08/07/2017, 7:57 AM

## 2017-08-07 NOTE — Progress Notes (Deleted)
Physical Therapy Discharge Summary  Patient Details  Name: Erin Patterson MRN: [id number] Date of Birth: Jul 29, 1932  Today's Date: 08/07/2017 PT Individual Time: 0900-1000 PT Individual Time Calculation (min): 60 min    Patient has met 7 of 7 long term goals due to improved activity tolerance, improved balance, improved postural control and improved attention.  Patient to discharge at an ambulatory level Supervision.   Patient's care partner is independent to provide the necessary cognitive assistance at discharge.  Reasons goals not met: Pt has met all rehab goals  Recommendation:  Patient will benefit from ongoing skilled PT services in home health setting to continue to advance safe functional mobility, address ongoing impairments in balance, LE strength, coordination, independence with functional mobility, and minimize fall risk.  Equipment: RW  Reasons for discharge: treatment goals met and discharge from hospital  Patient/family agrees with progress made and goals achieved: Yes   Skilled Intervention: Pt received supine in bed, agreeable to PT. No complaints of pain. Interpreter present during therapy session. Pt's husband present for family training. Bed mobility independent. Pt is able to don shoes while seated EOB with setup assist. Stand pivot transfer bed to w/c with RW and Supervision, v/c for safe transfer technique and use of RW. Ambulation x 150 ft with RW and Supervision, v/c for heel strike with RLE as pt refuses to wear "foot-up" brace. Educated pt and husband on importance of RLE clearance with gait to avoid falls. Pt demos improved safety with RW with gait but still needs cues when turning and to sit safely with RW. Car transfer with Supervision, v/c for safe transfer technique. Ascend/descend 12 stairs with L handrail and Supervision assist, husband needs v/c to stay closer to patient when assisting her on stairs. Berg Balance test 27/56, improved from 15/56. Pt left  seated in w/c in room with needs in reach.  PT Discharge Precautions/Restrictions Precautions Precautions: Fall Restrictions Weight Bearing Restrictions: No Vision/Perception  Vision - History Baseline Vision: No visual deficits Perception Perception: Within Functional Limits Praxis Praxis: Intact  Cognition Overall Cognitive Status: History of cognitive impairments - at baseline Arousal/Alertness: Awake/alert Orientation Level: Oriented to person Attention: Sustained Sustained Attention: Appears intact Memory: Impaired Memory Impairment: Decreased recall of new information;Decreased short term memory Awareness: Impaired Awareness Impairment: Anticipatory impairment;Emergent impairment;Intellectual impairment Problem Solving: Impaired Behaviors: Impulsive Safety/Judgment: Impaired Comments: Poor safety awareness with decreased awareness of deficits Sensation Sensation Light Touch: Appears Intact Proprioception: Appears Intact Coordination Gross Motor Movements are Fluid and Coordinated: No Coordination and Movement Description: Generalized weakness with mild R hemiplegia with R foot drag Heel Shin Test: impaired B Motor  Motor Motor: Hemiplegia Motor - Skilled Clinical Observations: Mild R hemiplegia Motor - Discharge Observations: mild R hemiplegia, generalized weakness  Mobility Bed Mobility Bed Mobility: Rolling Right;Rolling Left;Supine to Sit;Sit to Supine Rolling Right: Independent Rolling Left: Independent Supine to Sit: Independent Sit to Supine: Independent Transfers Sit to Stand: Supervision/Verbal cueing Stand Pivot Transfers: Supervision/Verbal cueing Stand Pivot Transfer Details: Verbal cues for precautions/safety;Verbal cues for safe use of DME/AE Transfer (Assistive device): Rolling walker Locomotion  Gait Ambulation: Yes Gait Assistance: Supervision/Verbal cueing Gait Distance (Feet): 150 Feet Assistive device: Rolling walker Gait Assistance  Details: Verbal cues for technique;Verbal cues for precautions/safety Gait Gait: Yes Gait Pattern: Impaired Gait Pattern: Decreased step length - left;Decreased step length - right;Decreased dorsiflexion - right;Decreased dorsiflexion - left;Decreased stride length Gait velocity: decreased Stairs / Additional Locomotion Stairs: Yes Stairs Assistance: Supervision/Verbal cueing Stair Management Technique: One rail  Left Number of Stairs: 12 Height of Stairs: 6  Trunk/Postural Assessment  Cervical Assessment Cervical Assessment: Within Functional Limits Thoracic Assessment Thoracic Assessment: Within Functional Limits Lumbar Assessment Lumbar Assessment: Within Functional Limits Postural Control Postural Control: Within Functional Limits  Balance Balance Balance Assessed: Yes Standardized Balance Assessment Standardized Balance Assessment: Berg Balance Test Berg Balance Test Sit to Stand: Able to stand  independently using hands Standing Unsupported: Able to stand 2 minutes with supervision Sitting with Back Unsupported but Feet Supported on Floor or Stool: Able to sit safely and securely 2 minutes Stand to Sit: Controls descent by using hands Transfers: Able to transfer with verbal cueing and /or supervision Standing Unsupported with Eyes Closed: Able to stand 10 seconds with supervision Standing Ubsupported with Feet Together: Able to place feet together independently and stand for 1 minute with supervision From Standing, Reach Forward with Outstretched Arm: Loses balance while trying/requires external support From Standing Position, Pick up Object from Floor: Unable to try/needs assist to keep balance From Standing Position, Turn to Look Behind Over each Shoulder: Needs supervision when turning Turn 360 Degrees: Needs close supervision or verbal cueing Standing Unsupported, Alternately Place Feet on Step/Stool: Able to complete >2 steps/needs minimal assist Standing Unsupported,  One Foot in Front: Able to take small step independently and hold 30 seconds Standing on One Leg: Tries to lift leg/unable to hold 3 seconds but remains standing independently Total Score: 27 Dynamic Sitting Balance Dynamic Sitting - Balance Support: During functional activity;Feet supported Dynamic Sitting - Level of Assistance: 7: Independent Sitting balance - Comments: Sitting EOB to don shoes Static Standing Balance Static Standing - Balance Support: During functional activity;Bilateral upper extremity supported Static Standing - Level of Assistance: 6: Modified independent (Device/Increase time) Dynamic Standing Balance Dynamic Standing - Balance Support: During functional activity;Bilateral upper extremity supported Dynamic Standing - Level of Assistance: 5: Stand by assistance Dynamic Standing - Balance Activities: Reaching for objects Extremity Assessment   RLE Assessment RLE Assessment: Exceptions to [facility] Passive Range of Motion (PROM) Comments: WFL Active Range of Motion (AROM) Comments: WFL General Strength Comments: 4/5 hip flex, knee flex/ext, ankle DF LLE Assessment LLE Assessment: Exceptions to [facility] Passive Range of Motion (PROM) Comments: WFL Active Range of Motion (AROM) Comments: WFL General Strength Comments: 3+/5 hip flex, 4/5 knee flex/ext, ankle DF   See Function Navigator for Current Functional Status.   Excell Seltzer, PT, DPT 08/07/2017, 12:20 PM

## 2017-08-07 NOTE — Progress Notes (Signed)
Occupational Therapy Discharge Summary  Patient Details  Name: Erin Patterson MRN: [id number] Date of Birth: [birth year]  Patient has met 8 of 8 long term goals due to improved activity tolerance, improved balance, postural control, ability to compensate for deficits, functional use of  RIGHT upper extremity, improved attention, improved awareness and improved coordination.  Patient to discharge at overall Supervision level.  Patient's care partner is independent to provide the necessary physical and cognitive assistance at discharge.  Pt's husband has been present for all OT session and is aware of the physical and cognitive assist pt will require at d/c. Her husband has completed hands on family education and demonstrates willing and ableness to provide the needed assist at d/c as he is able due to his own physical limitations. Pt requires max cuing for safety due to impulsivity, poor safety awareness, poor RW management, and decreased short term memory. Recommending close 24 hr supervision assist at d/c.    Recommendation:  Patient will benefit from ongoing skilled OT services in home health setting to continue to advance functional skills in the area of BADL and Reduce care partner burden.  Equipment: BSC and tub transfer bench  Reasons for discharge: treatment goals met and discharge from hospital  Patient/family agrees with progress made and goals achieved: Yes  OT Discharge Precautions/Restrictions  Precautions Precautions: Fall Restrictions Weight Bearing Restrictions: No Vision Baseline Vision/History: No visual deficits Patient Visual Report: No change from baseline Vision Assessment?: No apparent visual deficits Perception  Perception: Within Functional Limits Praxis Praxis: Intact Cognition Overall Cognitive Status: History of cognitive impairments - at baseline Arousal/Alertness: Awake/alert Sensation Sensation Light Touch: Appears Intact Hot/Cold: Appears  Intact Proprioception: Appears Intact Coordination Gross Motor Movements are Fluid and Coordinated: No Fine Motor Movements are Fluid and Coordinated: Yes Coordination and Movement Description: Generalized weakness with mild R hemiplegia with R foot drag 9 Hole Peg Test: R 40 sec  L 25.25 sec Motor  Motor Motor: Hemiplegia Motor - Discharge Observations: mild R hemiplegia, generalized weakness Trunk/Postural Assessment  Cervical Assessment Cervical Assessment: Within Functional Limits Thoracic Assessment Thoracic Assessment: Within Functional Limits Lumbar Assessment Lumbar Assessment: Within Functional Limits Postural Control Postural Control: Within Functional Limits  Balance Balance Balance Assessed: Yes Dynamic Sitting Balance Dynamic Sitting - Balance Support: During functional activity;Feet supported Dynamic Sitting - Level of Assistance: 7: Independent Static Standing Balance Static Standing - Balance Support: During functional activity;No upper extremity supported Static Standing - Level of Assistance: 6: Modified independent (Device/Increase time);5: Stand by assistance Dynamic Standing Balance Dynamic Standing - Balance Support: During functional activity;No upper extremity supported Dynamic Standing - Level of Assistance: 5: Stand by assistance Extremity/Trunk Assessment RUE Assessment RUE Assessment: Within Functional Limits Active Range of Motion (AROM) Comments: WNL General Strength Comments: 4+/5 throughout with functional and equal gross grasp RUE Body System: Neuro Brunstrum level for arm: Stage V Relative Independence from Synergy LUE Assessment LUE Assessment: Within Functional Limits   See Function Navigator for Current Functional Status.  Alwilda Gilland L 08/07/2017, 7:29 AM

## 2017-08-07 NOTE — Plan of Care (Signed)
  Problem: RH SAFETY Goal: RH STG ADHERE TO SAFETY PRECAUTIONS W/ASSISTANCE/DEVICE Description STG Adhere to Safety Precautions With  Min Assistance/Device.  Outcome: Progressing  Call light within reach, bed/chair alarm. Proper footwear

## 2017-08-07 NOTE — Progress Notes (Signed)
Physical Therapy Session Note  Patient Details  Name: Erin Patterson MRN: [id number] Date of Birth: August 29, 1932  Today's Date: 08/06/2017 PT Individual Time: 1030-1100   30 min   Short Term Goals: Week 1:  PT Short Term Goal 1 (Week 1): Pt will complete transfers with min A consistently PT Short Term Goal 2 (Week 1): Pt will ambulate x 150 ft with RW and min A PT Short Term Goal 3 (Week 1): Pt will ascend/descend 4 stairs with 2 handrails and min A  Skilled Therapeutic Interventions/Progress Updates:   Pt received sitting EOM  In rehab gym and agreeable to PT following OT treatment. Interpreter present for treatment. PT instructed pt in gait training with and without Foot up brace x 100 each with BUE support on RW and supervision assist from PT. Pt noted to have no difference in gait pattern or foot clearance on the R without brace vs with brace. Per OT both Pt and pt's husband have been trained in donning AFO, but neither are able to recall proper donning technique. Mild education provided for how to don AFO. PT instructed pt in Stair management with BUE support x 12 with supervision assist and step over step technique demonstrated by pt. Gait training back to room with RW, no AFO and supervision assist from PT. Pt performed ambulatory transfer to bed with supervision assist. Sit>supine completed with supervision assist, and left supine in bed with call bell in reach and all needs met.        Therapy Documentation Precautions:  Precautions Precautions: Fall Restrictions Weight Bearing Restrictions: No Vital Signs: Therapy Vitals Temp: 98 F (36.7 C) Temp Source: Oral Pulse Rate: 74 Resp: 16 BP: 126/79 Patient Position (if appropriate): Lying Oxygen Therapy SpO2: 100 % O2 Device: Room Air   See Function Navigator for Current Functional Status.   Therapy/Group: Individual Therapy  Lorie Phenix 08/07/2017, 7:13 AM

## 2017-08-08 ENCOUNTER — Other Ambulatory Visit: Payer: Self-pay | Admitting: Physical Medicine and Rehabilitation

## 2017-08-08 DIAGNOSIS — Z8673 Personal history of transient ischemic attack (TIA), and cerebral infarction without residual deficits: Secondary | ICD-10-CM

## 2017-08-08 DIAGNOSIS — I679 Cerebrovascular disease, unspecified: Secondary | ICD-10-CM

## 2017-08-08 MED ORDER — MUSCLE RUB 10-15 % EX CREA: 1.0000 "application " | TOPICAL_CREAM | Freq: Three times a day (TID) | CUTANEOUS | 0 refills | Status: DC

## 2017-08-08 MED ORDER — LISINOPRIL 5 MG PO TABS: 5.0000 mg | ORAL_TABLET | Freq: Every day | ORAL | 0 refills | Status: DC

## 2017-08-08 MED ORDER — ACETAMINOPHEN 325 MG PO TABS: 325.0000 mg | ORAL_TABLET | Freq: Four times a day (QID) | ORAL | Status: DC | PRN

## 2017-08-08 MED ORDER — MELATONIN 3 MG PO TABS: 3.0000 mg | ORAL_TABLET | Freq: Every evening | ORAL | 0 refills | Status: DC | PRN

## 2017-08-08 MED ORDER — CLOPIDOGREL BISULFATE 75 MG PO TABS: 75.0000 mg | ORAL_TABLET | Freq: Every day | ORAL | 0 refills | Status: DC

## 2017-08-08 MED ORDER — ASPIRIN 81 MG PO TBEC: 81.0000 mg | DELAYED_RELEASE_TABLET | Freq: Every day | ORAL | 0 refills | Status: DC

## 2017-08-08 MED ORDER — ATORVASTATIN CALCIUM 40 MG PO TABS: 40.0000 mg | ORAL_TABLET | Freq: Every day | ORAL | 0 refills | Status: DC

## 2017-08-08 NOTE — Progress Notes (Signed)
Education completed translator at bedside to assist with discharge instructions.

## 2017-08-08 NOTE — Progress Notes (Signed)
Subjective/Complaints:  Daughter arranging transportation to home tomorrow  ROS: limited language  Objective: Vital Signs: Blood pressure 131/72, pulse 93, temperature 97.8 F (36.6 C), temperature source Oral, resp. rate 12, height 5\' 4"  (1.626 m), weight 53.1 kg (117 lb 1 oz), SpO2 100 %. No results found. Results for orders placed or performed during the hospital encounter of 07/30/17 (from the past 72 hour(s))  CBC     Status: Abnormal   Collection Time: 08/07/17  5:16 AM  Result Value Ref Range   WBC 4.8 4.0 - 10.5 K/uL   RBC 4.67 3.87 - 5.11 MIL/uL   Hemoglobin 11.9 (L) 12.0 - 15.0 g/dL   HCT 16.1 09.6 - 04.5 %   MCV 77.7 (L) 78.0 - 100.0 fL   MCH 25.5 (L) 26.0 - 34.0 pg   MCHC 32.8 30.0 - 36.0 g/dL   RDW 40.9 81.1 - 91.4 %   Platelets 232 150 - 400 K/uL    Comment: Performed at [facility], [address], [place], [place] [address]     Constitutional: No distress . Vital signs reviewed. HEENT: EOMI, oral membranes moist Neck: supple Cardiovascular: RRR without murmur. No JVD    Respiratory: CTA Bilaterally without wheezes or rales. Normal effort    GI: BS +, non-tender, non-distended  Extremity:  No Edema Skin:   Intact Neuro: Other 4/5 on RIght side  HF, KE, ADF, Delt, Bi, Tri,Grip, intact bil finger to thumb Musc/Skel:  Normal and Other no pain with UE or LE ROM, mild tendrness to palpation left medial supramalleolar area Gen NAD   Assessment/Plan: 1. Functional deficits secondary to right hemiparesis From Left thalamic infarct which require 3+ hours per day of interdisciplinary therapy in a comprehensive inpatient rehab setting. Physiatrist is providing close team supervision and 24 hour management of active medical problems listed below. Physiatrist and rehab team continue to assess barriers to discharge/monitor patient progress toward functional and medical goals. FIM: Function - Bathing Position: Shower Body parts bathed by patient: Right arm,  Right lower leg, Left arm, Left lower leg, Chest, Back, Abdomen, Front perineal area, Buttocks, Right upper leg, Left upper leg Assist Level: Supervision or verbal cues  Function- Upper Body Dressing/Undressing What is the patient wearing?: Pull over shirt/dress Pull over shirt/dress - Perfomed by patient: Thread/unthread right sleeve, Thread/unthread left sleeve, Put head through opening, Pull shirt over trunk Assist Level: Set up Set up : To obtain clothing/put away Function - Lower Body Dressing/Undressing What is the patient wearing?: Underwear, Pants, Non-skid slipper socks, Shoes Position: Wheelchair/chair at sink Underwear - Performed by patient: Thread/unthread right underwear leg, Thread/unthread left underwear leg, Pull underwear up/down Pants- Performed by patient: Thread/unthread right pants leg, Thread/unthread left pants leg, Pull pants up/down, Fasten/unfasten pants Non-skid slipper socks- Performed by patient: Don/doff right sock, Don/doff left sock Shoes - Performed by patient: Don/doff right shoe, Don/doff left shoe, Fasten right, Fasten left Assist for footwear: Supervision/touching assist Assist for lower body dressing: Supervision or verbal cues  Function - Toileting Toileting steps completed by patient: Adjust clothing prior to toileting, Performs perineal hygiene, Adjust clothing after toileting Toileting Assistive Devices: Grab bar or rail Assist level: Supervision or verbal cues  Function - Archivist transfer assistive device: Grab bar, Walker Assist level to toilet: Supervision or verbal cues Assist level from toilet: Supervision or verbal cues  Function - Chair/bed transfer Chair/bed transfer method: Ambulatory Chair/bed transfer assist level: Supervision or verbal cues Chair/bed transfer assistive device: Walker Chair/bed  transfer details: Verbal cues for precautions/safety, Verbal cues for safe use of DME/AE  Function - Locomotion:  Wheelchair Will patient use wheelchair at discharge?: No Type: Manual Max wheelchair distance: 100' Assist Level: Touching or steadying assistance (Pt > 75%) Assist Level: Touching or steadying assistance (Pt > 75%) Wheel 150 feet activity did not occur: Safety/medical concerns Turns around,maneuvers to table,bed, and toilet,negotiates 3% grade,maneuvers on rugs and over doorsills: No Function - Locomotion: Ambulation Assistive device: Walker-rolling Max distance: 150' Assist level: Supervision or verbal cues Assist level: Supervision or verbal cues Assist level: Supervision or verbal cues Walk 150 feet activity did not occur: Safety/medical concerns Assist level: Supervision or verbal cues Walk 10 feet on uneven surfaces activity did not occur: Safety/medical concerns  Function - Comprehension Comprehension: Auditory Comprehension assistive device: Other(Interpreter) Comprehension assist level: Understands basic 90% of the time/cues < 10% of the time  Function - Expression Expression: Verbal Expression assist level: Expresses basic 75 - 89% of the time/requires cueing 10 - 24% of the time. Needs helper to occlude trach/needs to repeat words.  Function - Social Interaction Social Interaction assist level: Interacts appropriately 50 - 74% of the time - May be physically or verbally inappropriate.  Function - Problem Solving Problem solving assist level: Solves basic 50 - 74% of the time/requires cueing 25 - 49% of the time  Function - Memory Memory assist level: Recognizes or recalls 25 - 49% of the time/requires cueing 50 - 75% of the time Patient normally able to recall (first 3 days only): That he or she is in a hospital, Staff names and faces, Current season, Location of own room Medical Problem List and Plan: 1.  Right sided weakness and balance deficits  secondary to bilateral CVA, main fxnl def is from R thalamic infarct   Pt and family aware of d/c today 2.  DVT  Prophylaxis/Anticoagulation: Pharmaceutical: Lovenox 3. Pain Management: Off Meloxicam. topical analgesic ordered  - avoid NSAIDs 4. Mood: LCSW to follow up for evaluation and support.  5. Neuropsych: This patient is not capable of making decisions on herown behalf. 6. Skin/Wound Care: Routine pressure relief measures.  7. Fluids/Electrolytes/Nutrition: Monitor I/O. Check lytes in am.  8. Dyslipidemia: Now on Lipitor.  9. Sleep disturbance: Continue melatonin.  10. HTN: Monitor BP bid  Vitals:   08/08/17 0754 08/08/17 0755  BP: 131/72 131/72  Pulse:  93  Resp:    Temp:    SpO2:  100%  Controlled on lisinopril 5mg  6/14 11. Thrombocytopenia:Resolved, normal plt 6/13   12.  Transaminasemia likely secondary to statin but also with elvated bili and AlkP as well   -improving 13.  Hypoalbuminemia Start pro stat  LOS (Days) 9 A FACE TO FACE EVALUATION WAS PERFORMED  Erick Colace 08/08/2017, 8:24 AM

## 2017-08-08 NOTE — Plan of Care (Signed)
Care plans meet individual's needs, adequate for discharge

## 2017-08-08 NOTE — Discharge Summary (Signed)
Physician Discharge Summary  Patient ID: Erin Patterson MRN: [id number] DOB/AGE: [birth year] 84 y.o.  Admit date: 07/30/2017 Discharge date: 08/08/2017  Discharge Diagnoses:  Principal Problem:   Embolic stroke [facility]) Active Problems:   HLD (hyperlipidemia)   Dizziness   Benign essential HTN   Abnormal LFTs   Discharged Condition: stable   Significant Diagnostic Studies: N/A   Labs:  Basic Metabolic Panel: BMP Latest Ref Rng & Units 08/04/2017 07/31/2017 07/30/2017  Glucose 65 - 99 mg/dL 91 283(M) 93  BUN 6 - 20 mg/dL 62(H) 19 47(M)  Creatinine 0.44 - 1.00 mg/dL 5.46 5.03 5.46  Sodium 135 - 145 mmol/L 138 140 141  Potassium 3.5 - 5.1 mmol/L 4.3 4.1 4.6  Chloride 101 - 111 mmol/L 105 102 107  CO2 22 - 32 mmol/L 24 30 24   Calcium 8.9 - 10.3 mg/dL 5.6(C) 9.1 8.9    CBC: CBC Latest Ref Rng & Units 08/07/2017 08/04/2017 07/31/2017  WBC 4.0 - 10.5 K/uL 4.8 5.1 5.9  Hemoglobin 12.0 - 15.0 g/dL 11.9(L) 11.6(L) 13.8  Hematocrit 36.0 - 46.0 % 36.3 35.3(L) 42.3  Platelets 150 - 400 K/uL 232 163 158    CBG: No results for input(s): GLUCAP in the last 168 hours.  Brief HPI:   Erin Patterson is an 84 year old [place] female with history of HTN, CAD, CVA with mild residual R-HP and memory problems who was admitted on 07/26/2017 with HA, dizziness, difficulty walking with near falls and hypertensive emergency with BP 190/94 at admission. She was noted to be confused with right sided weakness and MRI brain done revealing acute left thalamic infarct, subcentimeter acute infarct in left and superior right cerebellum suggestive of emboli form heart or aorta. TEE done on 06/5 revealing moderate calcifications on MV and AV with grade 3 plaque in descending thoracic aorta and arch and was negative for PFO/ASD. Dr. Roda Shutters felt that bilateral strokes due to emboli from unknown source but suspicious for A fib".  She was started on DAPT X 3 weeks followed by ASA alone. She refused loop recorder and 30 day event  monitor recommended after discharge. Patient with deficits in mobility and self care tasks therefore CIR recommended for follow up therapy.    Hospital Course: Erin Patterson was admitted to rehab 07/30/2017 for inpatient therapies to consist of PT, ST and OT at least three hours five days a week. Past admission physiatrist, therapy team and rehab RN have worked together to provide customized collaborative inpatient rehab. She was maintained on ASA and plavix for secondary stroke prevention.  Follow up CBC showed that thrombocytopenia has resolved and drop in H/H was seen without signs of bleeding. She will need follow up CBC in 1-2 weeks to monitor for stability/revcovery. Blood pressures were monitored on bid basis and have been controlled on lisinopril.  Renal status has been monitored and showed recurrent dehydration.   Po intake has been good and she is continent of bowel and bladder. Protein supplement was added for low calorie malnutrition. Knee pain has been managed with use of Aspercreme qid. She was found to have abnormal LFTs and statin was held briefly with improvement.  No GI symptoms reported and Lipitor was resumed prior to discharge. She has progressed to close supervision level and will continue to receive follow up HHPT, HHOT, HHST and HHRN by [facility] after discharge.    Rehab course: During patient's stay in rehab weekly team conferences were held to monitor patient's progress, set  goals and discuss barriers to discharge. At admission, patient required min assist with basic self care tasks and mod assist with mobility. She exhibited moderate to severe cognitive deficits with poor frustration tolerance as well as poor awareness of deficits.  She  has had improvement in activity tolerance, balance, postural control as well as ability to compensate for deficits. She has had improvement in functional use RUE  and RLE as well as improvement in awareness.  She is able to complete ADL  tasks with supervision. She requires supervision with transfers and to ambulate 100' with RW.  She has achieved 1/3 cognitive goals. She continues to require mod assist for memory, problem solving and continues to have decreased safety awareness due to moderately severe cognitive deficits. Family education was completed with husband regarding all aspects of care and mobility as daughter was unable to come in for any education.     Disposition:  Home   Diet: Heart Healthy.   Special Instructions: 1. Encourage fluid intake.  2. Needs supervision with all activity and  with medication management.  3. Needs CBC and LFTs repeated in 1-2 weeks for monitoring.  4. Stop Plavix after June 26th.   Discharge Instructions    Ambulatory referral to Cardiology   Complete by:  As directed    Needs 30 day event monitor for stroke work up Patient does not speak English   Ambulatory referral to Physical Medicine Rehab   Complete by:  As directed    1-2 weeks transitional care appt     Allergies as of 08/08/2017   No Known Allergies     Medication List    STOP taking these medications   enoxaparin 30 MG/0.3ML injection Commonly known as:  LOVENOX     TAKE these medications   acetaminophen 325 MG tablet Commonly known as:  TYLENOL Take 1-2 tablets (325-650 mg total) by mouth every 6 (six) hours as needed for mild pain. What changed:    how much to take  reasons to take this   aspirin 81 MG EC tablet Take 1 tablet (81 mg total) by mouth daily.   atorvastatin 40 MG tablet Commonly known as:  LIPITOR Take 1 tablet (40 mg total) by mouth daily at 6 PM.   clopidogrel 75 MG tablet Commonly known as:  PLAVIX Take 1 tablet (75 mg total) by mouth daily.   lisinopril 5 MG tablet Commonly known as:  PRINIVIL,ZESTRIL Take 1 tablet (5 mg total) by mouth daily.   Melatonin 3 MG Tabs Take 1 tablet (3 mg total) by mouth at bedtime as needed. Can use as needed for insomnia. What changed:     when to take this  reasons to take this  additional instructions   MUSCLE RUB 10-15 % Crea Apply 1 application topically 3 (three) times daily after meals. To left for pain/discomfort      Follow-up Information    Jackie Plum, MD. Go on 08/26/2017.   Specialty:  Internal Medicine Why:  11:45 AM Contact information: [address] [place] [address] [phone]        Erick Colace, MD Follow up.   Specialty:  Physical Medicine and Rehabilitation Why:  office will call you with follow up appointment Contact information: [address] [place] [address] [phone]        Marvel Plan, MD. Call in 1 day(s).   Specialty:  Neurology Why:  for follow up appointment Contact information: [address]  [place] [address] [phone]        [facility] Office Follow up.   Specialty:  Cardiology Why:  will contact you about 30 day event monitor Contact information: [address], [address] [place] [address] [phone]          Signed: Jacquelynn Cree 08/11/2017, 12:25 PM

## 2017-08-08 NOTE — Plan of Care (Signed)
  Problem: RH SAFETY Goal: RH STG ADHERE TO SAFETY PRECAUTIONS W/ASSISTANCE/DEVICE Description STG Adhere to Safety Precautions With  Min Assistance/Device.  Outcome: Progressing  Call light at hand, bed alarm, proper footwear

## 2017-08-08 NOTE — Discharge Instructions (Signed)
Inpatient Rehab Discharge Instructions  Erin Patterson Discharge date and time: 08/08/17   Activities/Precautions/ Functional Status: Activity: activity as tolerated Diet: cardiac diet Wound Care: none needed   Functional status:  ___ No restrictions     ___ Walk up steps independently _X__ 24/7 supervision/assistance   ___ Walk up steps with assistance ___ Intermittent supervision/assistance  ___ Bathe/dress independently ___ Walk with walker     _X__ Bathe/dress with assistance ___ Walk Independently    ___ Shower independently ___ Walk with assistance    ___ Shower with assistance _X__ No alcohol     ___ Return to work/school ________  Special Instructions:   COMMUNITY REFERRALS UPON DISCHARGE:   Home Health:   PT     OT     ST      RN   Agency:  [facility] Phone:  [phone] Medical Equipment/Items Ordered:  Rolling walker; tub transfer bench; bedside commode  Agency/Supplier:  [facility]         Phone:  [phone]  GENERAL COMMUNITY RESOURCES FOR PATIENT/FAMILY: Support Groups:  [facility] Stroke Support Group                              Meets the second Thursday of each month from 3-4 PM (except June, July, and August)                              In the dayroom of [facility], [address]                              For more information, call [phone]  STROKE/TIA DISCHARGE INSTRUCTIONS SMOKING Cigarette smoking nearly doubles your risk of having a stroke & is the single most alterable risk factor  If you smoke or have smoked in the last 12 months, you are advised to quit smoking for your health.  Most of the excess cardiovascular risk related to smoking disappears within a year of stopping.  Ask you doctor about anti-smoking medications  [place] Quit Line: 1-800-QUIT NOW  Free Smoking Cessation Classes [phone]  CHOLESTEROL Know your levels; limit fat & cholesterol in your diet    Lipid Panel     Component Value Date/Time   CHOL 201 (H) 07/27/2017 0712   TRIG 44 07/27/2017 0712   HDL 66 07/27/2017 0712   CHOLHDL 3.0 07/27/2017 0712   VLDL 9 07/27/2017 0712   LDLCALC 126 (H) 07/27/2017 0712      Many patients benefit from treatment even if their cholesterol is at goal.  Goal: Total Cholesterol (CHOL) less than 160  Goal:  Triglycerides (TRIG) less than 150  Goal:  HDL greater than 40  Goal:  LDL (LDLCALC) less than 100   BLOOD PRESSURE American Stroke Association blood pressure target is less that 120/80 mm/Hg  Your discharge blood pressure is:  BP: 139/72  Monitor your blood pressure  Limit your salt and alcohol intake  Many individuals will require more than one medication for high blood pressure  DIABETES (A1c is a blood sugar average for last 3 months) Goal HGBA1c is under 7% (HBGA1c is blood sugar average for last 3 months)  Diabetes: No known diagnosis of diabetes    Lab Results  Component Value Date   HGBA1C 5.4  07/28/2017     Your HGBA1c can be lowered with medications, healthy diet, and exercise.  Check your blood sugar as directed by your physician  Call your physician if you experience unexplained or low blood sugars.  PHYSICAL ACTIVITY/REHABILITATION Goal is 30 minutes at least 4 days per week  Activity: Increase activity slowly, Therapies: see above Return to work: N/A  Activity decreases your risk of heart attack and stroke and makes your heart stronger.  It helps control your weight and blood pressure; helps you relax and can improve your mood.  Participate in a regular exercise program.  Talk with your doctor about the best form of exercise for you (dancing, walking, swimming, cycling).  DIET/WEIGHT Goal is to maintain a healthy weight  Your discharge diet is:  Diet Order           Diet Heart Room service appropriate? Yes; Fluid consistency: Thin  Diet effective now         liquids Your height is:  Height: 5\' 4"   (162.6 cm) Your current weight is: Weight: 53.1 kg (117 lb 1 oz) Your Body Mass Index (BMI) is:  BMI (Calculated): 20.08  Following the type of diet specifically designed for you will help prevent another stroke.  You are at goal weight  Your goal Body Mass Index (BMI) is 19-24.  Healthy food habits can help reduce 3 risk factors for stroke:  High cholesterol, hypertension, and excess weight.  RESOURCES Stroke/Support Group:  Call [phone]   STROKE EDUCATION PROVIDED/REVIEWED AND GIVEN TO PATIENT Stroke warning signs and symptoms How to activate emergency medical system (call 911). Medications prescribed at discharge. Need for follow-up after discharge. Personal risk factors for stroke. Pneumonia vaccine given:  Flu vaccine given:  My questions have been answered, the writing is legible, and I understand these instructions.  I will adhere to these goals & educational materials that have been provided to me after my discharge from the hospital.     My questions have been answered and I understand these instructions. I will adhere to these goals and the provided educational materials after my discharge from the hospital.  Patient/Caregiver Signature _______________________________ Date __________  Clinician Signature _______________________________________ Date __________  Please bring this form and your medication list with you to all your follow-up doctor's appointments.

## 2017-08-10 NOTE — Progress Notes (Signed)
Social Work Discharge Note  The overall goal for the admission was met for:   Discharge location: Yes - home with husband  Length of Stay: Yes - 9 days  Discharge activity level: Yes - close supervision 24/7  Home/community participation: Yes  Services provided included: MD, RD, PT, OT, SLP, RN, Pharmacy and SW  Financial Services: Medicaid  Follow-up services arranged: Home Health: PT/OT/ST/RN from [facility], DME: Rolling walker, tub transfer bench, bedside commode from [facility] and Patient/Family has no preference for HH/DME agencies  Comments (or additional information):  Pt's husband received family education.  Pt's dtr arranged ride home for pt from neighbor.  Interpreter was present for d/c instructions.    Patient/Family verbalized understanding of follow-up arrangements: Yes  Individual responsible for coordination of the follow-up plan: pt's husband  Confirmed correct DME delivered: Trey Sailors 08/10/2017    Ehren Berisha, Silvestre Mesi

## 2017-08-10 NOTE — Progress Notes (Signed)
Social Work Patient ID: Erin Patterson, female   DOB: [birth year], 84 y.o.   MRN: [id number]   CSW met with pt 08-06-17 to update her on team conference discussion and targeted d/c date of 08-08-17 with the assistance of the interpreter.  CSW also talked with husband later and he was aware of her d/c date.  Pt's dtr is working on getting pt a ride home on Friday, 08-08-17.  CSW will order DME and set up f/u therapies and PCP appt.  CSW remains available to assist as needed.

## 2017-08-10 NOTE — Patient Care Conference (Signed)
Inpatient RehabilitationTeam Conference and Plan of Care Update Date: 08/06/2017   Time: 10:45 AM    Patient Name: Erin Patterson      Medical Record Number: [id number]  Date of Birth: [birth year] Sex: Female         Room/Bed: 4W18C/4W18C-01 Payor Info: Payor: MEDICAID [place] / Plan: MEDICAID [place] ACCESS / Product Type: *No Product type* /    Admitting Diagnosis: CVA  Admit Date/Time:  07/30/2017  5:39 PM Admission Comments: No comment available   Primary Diagnosis:  <principal problem not specified> Principal Problem: <principal problem not specified>  Patient Active Problem List   Diagnosis Date Noted  . Embolic stroke (HCC) 07/30/2017  . Thrombocytopenia (HCC)   . Essential hypertension   . Sleep disturbance   . Dyslipidemia   . H/O: stroke   . Intracranial vascular stenosis   . Ischemic cerebrovascular accident (CVA) (HCC)   . Benign essential HTN   . AKI (acute kidney injury) (HCC)   . Cerebrovascular accident (CVA) due to embolism of cerebral artery (HCC)   . Hypophosphatemia   . Elevated brain natriuretic peptide (BNP) level   . Cardiomegaly   . Hypertensive urgency   . Leg cramp   . Dizziness 07/26/2017  . Weakness   . Hypokalemia   . Headache 12/10/2014  . Fever 12/10/2014  . Vision loss 12/10/2014  . HLD (hyperlipidemia) 12/10/2014  . MI (myocardial infarction) (HCC) 12/10/2014  . CAD (coronary artery disease) 12/10/2014    Expected Discharge Date: Expected Discharge Date: 08/08/17  Team Members Present: Physician leading conference: Dr. Claudette Laws Social Worker Present: Staci Acosta, LCSW Nurse Present: Other (comment)(Denise Sharon Seller, RN) PT Present: Aleda Grana, PT OT Present: Johnsie Cancel, OT SLP Present: Jackalyn Lombard, SLP PPS Coordinator present : Tora Duck, RN, CRRN     Current Status/Progress Goal Weekly Team Focus  Medical   With decreased appetite,   Reduce fall risk  Discharge planning   Bowel/Bladder   continent of bowel & bladder,  LBM 08/04/17  remain continent  continue to assist as needed & monitor   Swallow/Nutrition/ Hydration             ADL's   Close supervision - steadying assist overall using RW requiring VCs for RW management. VCs required for sequencing ADLs 2/2 cognitive impairments  Supervision overall  Dynamic standing balance, RW management in functional context, ADL re-training, neuro re-ed, family training and d/c planning   Mobility   close supervision<>min assist overall with RW & R foot-up brace, cognitive impairments & decreased safety awareness, family education ongoing, impaired balance  supervision overall  pt/family education & d/c planning, NMR, balance, transfers, gait, stair negotiation, endurance & strentgthening   Communication             Safety/Cognition/ Behavioral Observations  Max A - Mod A  Min A  safety awareness, basic problem solving, recall strategies   Pain   no c/o pain, has tylenol prn & scheduled muscle rub to her calves & left ankle  pain scale <4/10  assess & treat as needed   Skin   no areas of skin break down  no new areas of skin break down  assess q shift    Rehab Goals Patient on target to meet rehab goals: Yes Rehab Goals Revised: none *See Care Plan and progress notes for long and short-term goals.     Barriers to Discharge  Current Status/Progress Possible Resolutions Date Resolved   Physician    Decreased caregiver  support  Husband with left upper extremity amputation  Progressing towards goals  Plan discharge this week      Nursing                  PT                    OT                  SLP                SW                Discharge Planning/Teaching Needs:  Pt to return to her home with her husband (24/7) and dtr (only at night and one day a week) to provide close supervision.  Pt's husband has been trained by therapists.   Team Discussion: Pt is medically stable for d/c on 08-06-17, but still checking stool specimen.  Pt needs close  supervision and max cueing due to impulsivity, no recall.  Education has begun with husband and he needs reinforcement to provide the supervision she needs.  PT has given pt a foot up brace.  Pt is mod to max A for cognition, with min A overall goals.  Pt has poor memory at baseline and team questions if she has dementia PTA.  Revisions to Treatment Plan:  none    Continued Need for Acute Rehabilitation Level of Care: The patient requires daily medical management by a physician with specialized training in physical medicine and rehabilitation for the following conditions: Daily direction of a multidisciplinary physical rehabilitation program to ensure safe treatment while eliciting the highest outcome that is of practical value to the patient.: Yes Daily medical management of patient stability for increased activity during participation in an intensive rehabilitation regime.: Yes Daily analysis of laboratory values and/or radiology reports with any subsequent need for medication adjustment of medical intervention for : Neurological problems  Erin Patterson, [place] 08/10/2017, 9:40 AM

## 2017-08-11 DIAGNOSIS — R7989 Other specified abnormal findings of blood chemistry: Secondary | ICD-10-CM

## 2017-08-11 DIAGNOSIS — R945 Abnormal results of liver function studies: Secondary | ICD-10-CM

## 2017-08-12 ENCOUNTER — Telehealth: Payer: Self-pay | Admitting: Registered Nurse

## 2017-08-12 MED ORDER — SENNOSIDES-DOCUSATE SODIUM 8.6-50 MG PO TABS: 1.0000 | ORAL_TABLET | Freq: Every day | ORAL | 1 refills | Status: DC

## 2017-08-12 NOTE — Telephone Encounter (Signed)
Placed a call to Erin Patterson, he doesn't speak english. Sent an email to [place] the SW to see if they have the daughter's phone number.

## 2017-08-12 NOTE — Telephone Encounter (Signed)
Transitional Care call Transitional Care Call Questions were asked  Via [employer] Language Line  Patient name: Erin Patterson  DOB: 1933-01-26 1. Are you/is patient experiencing any problems since coming home?  a. Are there any questions regarding any aspect of care? No 2. Are there any questions regarding medications administration/dosing? No a. Are meds being taken as prescribed? Yes b. "Patient should review meds with caller to confirm"  3. Have there been any falls? No 4. Has Home Health been to the house and/or have they contacted you? No, [facility] a. If not, have you tried to contact them? No b. Can we help you contact them? Yes 5. Are bowels and bladder emptying properly? Urinating regularly, periods of constipation. Senokot ordered.  a. Are there any unexpected incontinence issues? NA b. If applicable, is patient following bowel/bladder programs? NA 6. Any fevers, problems with breathing, unexpected pain? NA 7. Are there any skin problems or new areas of breakdown? No 8. Has the patient/family member arranged specialty MD follow up (ie cardiology/neurology/renal/surgical/etc.)?  No a. Can we help arrange? Yes 9. Does the patient need any other services or support that we can help arrange? No 10. Are caregivers following through as expected in assisting the patient? Yes 11. Has the patient quit smoking, drinking alcohol, or using drugs as recommended? Mr. Knaggs reports his wife doesn't smoke, drink alcohol, or use illicit drugs.   Appointment date/time 06/ 25/2019, arrival time 1:20 for 1:40 appointment with Jacalyn Lefevre ANP. At [address] [facility] [address]

## 2017-08-13 ENCOUNTER — Telehealth: Payer: Self-pay | Admitting: Registered Nurse

## 2017-08-13 NOTE — Telephone Encounter (Signed)
Call was Placed to Erin Patterson ( Husband) via [employer].  This Provider scheduled Dr. Algie Coffer : 1. Physical Medicine Rehabilitation Appointment with Riley Lam on 08/20/2017 arrival time 1:20 pm.  2.[facility] [address]: 08/20/2017, arrival time 2:15 appointment time 2:30  3. [facility]: 09/10/2017 arrival time 3:15 with George Hugh NP/PA  4. [facility] Nurse scheduled to come out on Friday 08/15/2017 Physical Therapist Scheduled to come out on 08/16/2017 Occupational and Speech Therapist Scheduled to come out on Monday 08/18/2017.   Erin. Erin Patterson understanding via Nurse, learning disability. All questions answered.

## 2017-08-18 ENCOUNTER — Telehealth: Payer: Self-pay

## 2017-08-18 NOTE — Telephone Encounter (Signed)
Jon Gills, RN/[facility] called requesting verbal orders for nurse and lab draw CBC and LFTs. Verbal orders given per discharge summary

## 2017-08-19 ENCOUNTER — Telehealth: Payer: Self-pay

## 2017-08-19 ENCOUNTER — Encounter: Payer: Medicaid Other | Admitting: Registered Nurse

## 2017-08-19 NOTE — Telephone Encounter (Signed)
Placed a call to Mr. Janee Morn via [employer].  Called regarding Mrs. Channell  hypertension. Translator spoke with husband, he refuses ED evaluation at this time. He wants to wait till tomorrow he has appointments with Cardiology and Physical medicine he reports. Marland Kitchen

## 2017-08-19 NOTE — Telephone Encounter (Signed)
[name],RN from [facility] called stating that seen pt in home today. BP sitting 180/88 and standing 182/92, also complaining of dizziness and fatigue. Pt has an appt tomorrow.

## 2017-08-20 ENCOUNTER — Encounter: Payer: Self-pay | Admitting: Registered Nurse

## 2017-08-20 ENCOUNTER — Ambulatory Visit (INDEPENDENT_AMBULATORY_CARE_PROVIDER_SITE_OTHER): Payer: Medicaid Other

## 2017-08-20 ENCOUNTER — Encounter: Payer: Medicaid Other | Attending: Registered Nurse | Admitting: Registered Nurse

## 2017-08-20 ENCOUNTER — Other Ambulatory Visit: Payer: Self-pay | Admitting: Nurse Practitioner

## 2017-08-20 VITALS — BP 189/94 | HR 77 | Resp 14 | Ht 61.0 in | Wt 118.0 lb

## 2017-08-20 DIAGNOSIS — I252 Old myocardial infarction: Secondary | ICD-10-CM | POA: Diagnosis not present

## 2017-08-20 DIAGNOSIS — Z09 Encounter for follow-up examination after completed treatment for conditions other than malignant neoplasm: Secondary | ICD-10-CM | POA: Insufficient documentation

## 2017-08-20 DIAGNOSIS — G479 Sleep disorder, unspecified: Secondary | ICD-10-CM | POA: Diagnosis not present

## 2017-08-20 DIAGNOSIS — E785 Hyperlipidemia, unspecified: Secondary | ICD-10-CM | POA: Diagnosis not present

## 2017-08-20 DIAGNOSIS — Z8673 Personal history of transient ischemic attack (TIA), and cerebral infarction without residual deficits: Secondary | ICD-10-CM | POA: Diagnosis not present

## 2017-08-20 DIAGNOSIS — I639 Cerebral infarction, unspecified: Secondary | ICD-10-CM

## 2017-08-20 DIAGNOSIS — I6340 Cerebral infarction due to embolism of unspecified cerebral artery: Secondary | ICD-10-CM | POA: Diagnosis not present

## 2017-08-20 DIAGNOSIS — I4891 Unspecified atrial fibrillation: Secondary | ICD-10-CM | POA: Diagnosis not present

## 2017-08-20 DIAGNOSIS — I10 Essential (primary) hypertension: Secondary | ICD-10-CM | POA: Diagnosis not present

## 2017-08-20 DIAGNOSIS — Z7902 Long term (current) use of antithrombotics/antiplatelets: Secondary | ICD-10-CM | POA: Diagnosis not present

## 2017-08-20 MED ORDER — LISINOPRIL 10 MG PO TABS: 10.0000 mg | ORAL_TABLET | Freq: Every day | ORAL | 0 refills | Status: DC

## 2017-08-20 NOTE — Progress Notes (Signed)
Subjective:    Patient ID: Erin Patterson, female    DOB: [birth year], 84 y.o.   MRN: [id number]  HPI: [name] is a 84 year old female who is here for transitional care visit in follow up of her CVA, Hypertension ( uncontrolled hypertension today), Dyslipidemia and sleep disturbance. She has been home with home health therapies with [facility], receiving physical, occupational and speech therapy.  [name] arrived hypertensive she is asymptomatic and medication list was reviewed, her follow up appointment with PCP is on September 01, 2017 at [facility] with Erin Patterson. Today Lisinopril increased to 10 mg daily, she will continue current medication and instructed to take two tablets daily, the instructions were translated and they verbalized understanding. BMP ordered today. Also according to discharge instructions  Plavix will be discontinued, they verbalize understanding. The follow up appointment with Neurology is on September 10, 2017.  Ms. Dam reports good appetite and denies pain. She rates her pain 0.    She arrived with [place] translator and friendly friend  Erin Patterson, Erin Patterson and Husband in room. .    Pain Inventory Average Pain 0 Pain Right Now 0 My pain is no pain  In the last 24 hours, has pain interfered with the following? General activity 0 Relation with others 0 Enjoyment of life 0 What TIME of day is your pain at its worst? no pain Sleep (in general) Poor  Pain is worse with: no pain Pain improves with: no pain Relief from Meds: no pain  Mobility walk without assistance walk with assistance use a walker  Function retired Do you have any goals in this area?  no  Neuro/Psych trouble walking  Prior Studies Any changes since last visit?  no  Physicians involved in your care Any changes since last visit?  no   History reviewed. No pertinent family history. Social History   Socioeconomic History  . Marital status: Married    Spouse name:  Not on file  . Number of children: Not on file  . Years of education: Not on file  . Highest education level: Not on file  Occupational History  . Not on file  Social Needs  . Financial resource strain: Not on file  . Food insecurity:    Worry: Not on file    Inability: Not on file  . Transportation needs:    Medical: Not on file    Non-medical: Not on file  Tobacco Use  . Smoking status: Never Smoker  . Smokeless tobacco: Never Used  Substance and Sexual Activity  . Alcohol use: Never    Frequency: Never  . Drug use: Never  . Sexual activity: Not on file  Lifestyle  . Physical activity:    Days per week: Not on file    Minutes per session: Not on file  . Stress: Not on file  Relationships  . Social connections:    Talks on phone: Not on file    Gets together: Not on file    Attends religious service: Not on file    Active member of club or organization: Not on file    Attends meetings of clubs or organizations: Not on file    Relationship status: Not on file  Other Topics Concern  . Not on file  Social History Narrative  . Not on file   Past Surgical History:  Procedure Laterality Date  . TEE WITHOUT CARDIOVERSION N/A 07/30/2017   Procedure: TRANSESOPHAGEAL ECHOCARDIOGRAM (TEE);  Surgeon: Laurey Morale, MD;  Location: [facility] ENDOSCOPY;  Service: Cardiovascular;  Laterality: N/A;   Past Medical History:  Diagnosis Date  . Hypercholesteremia   . Hypertension   . MI (myocardial infarction) (HCC)   . Stroke (cerebrum) (HCC)    years ago--episode of "bleeding from right ear and onset of weakness"    BP (!) 196/101   Pulse 78   Resp 14   Ht 5\' 1"  (1.549 m)   Wt 118 lb (53.5 kg)   SpO2 98%   BMI 22.30 kg/m   Opioid Risk Score:   Fall Risk Score:  `1  Depression screen PHQ 2/9  No flowsheet data found.  Review of Systems  Constitutional: Positive for appetite change.  HENT: Negative.   Eyes: Negative.   Respiratory: Negative.   Cardiovascular: Negative.    Gastrointestinal: Negative.   Endocrine: Negative.   Genitourinary: Negative.   Musculoskeletal: Positive for gait problem.  Skin: Negative.   Allergic/Immunologic: Negative.   Hematological: Negative.   Psychiatric/Behavioral: Negative.        Objective:   Physical Exam  Constitutional: She is oriented to person, place, and time. She appears well-developed and well-nourished.  HENT:  Head: Normocephalic and atraumatic.  Neck: Normal range of motion. Neck supple.  Cardiovascular: Normal rate and regular rhythm.  Pulmonary/Chest: Effort normal and breath sounds normal.  Musculoskeletal:  Normal Muscle Bulk and Muscle Testing Reveals: Upper Extremities: Full ROM and Muscle Strength 5/5 Lower Extremities: Full ROM and Muscle Strength 5/5 Arises from Table with Ease Narrow Based Gait/ Gait is Steady with Normal Steps. \  Neurological: She is alert and oriented to person, place, and time.  Skin: Skin is warm and dry.  Psychiatric: She has a normal mood and affect. Her behavior is normal.  Nursing note and vitals reviewed.         Assessment & Plan:  1. Cerebrovascular accident due to embolism of cerebral artery: Continue Home Health Therapies. Has a follow up appointment with Neurology.  Essential Hypertension/ Uncontrolled Hypertension: Increase Lisinopril to 10 mg Daily. Has a F/U appointment with PCP on 09/01/2017.  2. Dyslipidemia: Continue current medication regime. PCP will be Following 3. Sleep Disturbance: Continue Melatonin as needed. PCP will be followingFollowing.  40 minutes of face to face patient care time was spent during this visit. All questions were encouraged and answered

## 2017-08-21 LAB — BASIC METABOLIC PANEL
BUN/Creatinine Ratio: 20 (ref 12–28)
BUN: 15 mg/dL (ref 8–27)
CALCIUM: 9.2 mg/dL (ref 8.7–10.3)
CO2: 24 mmol/L (ref 20–29)
CREATININE: 0.76 mg/dL (ref 0.57–1.00)
Chloride: 105 mmol/L (ref 96–106)
GFR calc non Af Amer: 73 mL/min/{1.73_m2} (ref >59)
GFR, EST AFRICAN AMERICAN: 84 mL/min/{1.73_m2} (ref >59)
Glucose: 84 mg/dL (ref 65–99)
Potassium: 4.5 mmol/L (ref 3.5–5.2)
Sodium: 144 mmol/L (ref 134–144)

## 2017-08-22 ENCOUNTER — Telehealth: Payer: Self-pay

## 2017-08-22 ENCOUNTER — Telehealth: Payer: Self-pay | Admitting: Nurse Practitioner

## 2017-08-22 NOTE — Telephone Encounter (Signed)
New message     Patient has removed monitor. Would like assistance to resume monitoring. Speak with son [phone]

## 2017-08-22 NOTE — Telephone Encounter (Signed)
Rev[facility] called requesting verbal orders of 1wk2, left verbal orders of VM per discharge summary.

## 2017-08-27 ENCOUNTER — Telehealth: Payer: Self-pay | Admitting: *Deleted

## 2017-08-27 NOTE — Telephone Encounter (Signed)
Amber, PT, [facility] left a message asking for verbal orders for HHPT 2week3 followed by 1week2.  Chart reviewed. Verbal orders given per office protocol

## 2017-09-10 ENCOUNTER — Ambulatory Visit: Payer: Medicaid Other | Admitting: Adult Health

## 2017-09-10 ENCOUNTER — Encounter: Payer: Self-pay | Admitting: Adult Health

## 2017-09-10 VITALS — BP 178/87 | HR 81 | Ht 61.0 in | Wt 116.4 lb

## 2017-09-10 DIAGNOSIS — I6340 Cerebral infarction due to embolism of unspecified cerebral artery: Secondary | ICD-10-CM | POA: Diagnosis not present

## 2017-09-10 DIAGNOSIS — E785 Hyperlipidemia, unspecified: Secondary | ICD-10-CM

## 2017-09-10 DIAGNOSIS — I10 Essential (primary) hypertension: Secondary | ICD-10-CM

## 2017-09-10 NOTE — Progress Notes (Signed)
[facility] [address] [place]. [place] [address] [phone]       OFFICE FOLLOW UP NOTE  Ms. Kingsley Marciano Sequin Date of Birth:  1932-08-02 Medical Record Number:  [id number]   Reason for Referral:  hospital stroke follow up  CHIEF COMPLAINT:  Chief Complaint  Patient presents with  . New Patient (Initial Visit)    New room. Hospital f/u post CVA. Was at [facility] 07/30/17-08/08/17.   Marland Kitchen Cerebrovascular Accident    Patient feels very tired all of the time. She is unsure which medications she is supposed to be taking.     HPI: Nona Oda Placke is being seen today for initial visit in the office for bilateral small embolic infarcts on 07/27/17. History obtained from patient and chart review. Reviewed all radiology images and labs personally.  Ms. Yong Wahlquist Henigan is a 84 y.o. female with history of hypertension, hyperlipidemia, coronary artery disease with previous MI and former stroke who presented with worsening right-sided weakness.  CT has been negative for acute stroke.  CTA head and neck reviewed and was negative for emergent large vessel occlusion but did show intracranial atherosclerosis moderate left M1 and mild right V4 stenosis.  MRI reviewed and showed left lateral thalamic, tiny left posterior inferior cerebellar, right superior cerebellar and punctate right caudate white matter infarcts.  2D echo showed an EF of 65 to 70% without cardiac source of embolus.  TEE negative for PFO with negative for cardiac emboli.  Patient refused loop recorder but was agreeable to 30-day cardiac monitor due to embolic infarcts without known source and suspicion for atrial fibrillation.  LDL 126 and Lipitor was increased to 40 mg daily.  Patient was on antithrombotic PTA and recommended DAPT for 3 weeks and then aspirin alone.  Patient was discharged to CIR in stable condition for continued therapies.  Patient is being seen today for hospital follow-up and is accompanied by interpreter for  [place]  Overall she has been recovering well with only mild right-sided weakness and does use cane for assistance.  She continues to receive home PT/OT/ST.  She continues to take aspirin without side effects of bleeding or bruising.  Continues to take Lipitor without side effects myalgias.  Blood pressure elevated at today's appointment 178/87.  Patient does not currently check this at home and it was undetermined if therapies were monitoring this during therapy sessions.  Per notes, 30-day cardiac monitor placed on 08/20/2017.  Patient states the monitor is at home and it was unable to be determined reasoning she did not have it currently on.  Patient's husband and friend were waiting in the waiting room and her friend spoke small amount of [place].  She states she will ensure heart monitor is hooked back up to patient when they return home.  Patient denies new or worsening stroke/TIA symptoms.  ROS:   14 system review of systems performed and negative with exception of weakness and numbness  PMH:  Past Medical History:  Diagnosis Date  . Hypercholesteremia   . Hypertension   . MI (myocardial infarction) (HCC)   . Stroke (cerebrum) (HCC)    years ago--episode of "bleeding from right ear and onset of weakness"     PSH:  Past Surgical History:  Procedure Laterality Date  . TEE WITHOUT CARDIOVERSION N/A 07/30/2017   Procedure: TRANSESOPHAGEAL ECHOCARDIOGRAM (TEE);  Surgeon: Laurey Morale, MD;  Location: [facility] ENDOSCOPY;  Service: Cardiovascular;  Laterality: N/A;    Social History:  Social History  Socioeconomic History  . Marital status: Married    Spouse name: Not on file  . Number of children: Not on file  . Years of education: Not on file  . Highest education level: Not on file  Occupational History  . Not on file  Social Needs  . Financial resource strain: Not on file  . Food insecurity:    Worry: Not on file    Inability: Not on file  . Transportation needs:    Medical:  Not on file    Non-medical: Not on file  Tobacco Use  . Smoking status: Never Smoker  . Smokeless tobacco: Never Used  Substance and Sexual Activity  . Alcohol use: Never    Frequency: Never  . Drug use: Never  . Sexual activity: Not on file  Lifestyle  . Physical activity:    Days per week: Not on file    Minutes per session: Not on file  . Stress: Not on file  Relationships  . Social connections:    Talks on phone: Not on file    Gets together: Not on file    Attends religious service: Not on file    Active member of club or organization: Not on file    Attends meetings of clubs or organizations: Not on file    Relationship status: Not on file  . Intimate partner violence:    Fear of current or ex partner: Not on file    Emotionally abused: Not on file    Physically abused: Not on file    Forced sexual activity: Not on file  Other Topics Concern  . Not on file  Social History Narrative  . Not on file    Family History: No family history on file.  Medications:   Current Outpatient Medications on File Prior to Visit  Medication Sig Dispense Refill  . aspirin 81 MG EC tablet Take 1 tablet (81 mg total) by mouth daily. 100 tablet 0  . atorvastatin (LIPITOR) 40 MG tablet Take 1 tablet (40 mg total) by mouth daily at 6 PM. 30 tablet 0  . clopidogrel (PLAVIX) 75 MG tablet Take 1 tablet (75 mg total) by mouth daily. (Patient not taking: Reported on 09/10/2017) 30 tablet 0  . lisinopril (PRINIVIL) 10 MG tablet Take 1 tablet (10 mg total) by mouth daily. 30 tablet 0  . meloxicam (MOBIC) 7.5 MG tablet Take 7.5 mg by mouth daily.     No current facility-administered medications on file prior to visit.     Allergies:  No Known Allergies   Physical Exam  Vitals:   09/10/17 1535  BP: (!) 178/87  Pulse: 81  Weight: 116 lb 6.4 oz (52.8 kg)  Height: 5\' 1"  (1.549 m)   Body mass index is 21.99 kg/m. No exam data present  General: Frail elderly [place] female, seated,  in no evident distress Head: head normocephalic and atraumatic.   Neck: supple with no carotid or supraclavicular bruits Cardiovascular: regular rate and rhythm, no murmurs Musculoskeletal: no deformity Skin:  no rash/petichiae Vascular:  Normal pulses all extremities  Neurologic Exam Mental Status: Awake and fully alert. Oriented to place and time per interpreter. Recent and remote memory intact. Attention span, concentration and fund of knowledge appropriate. Mood and affect appropriate.  Cranial Nerves: Fundoscopic exam reveals sharp disc margins. Pupils equal, briskly reactive to light. Extraocular movements full without nystagmus. Visual fields full to confrontation. Hearing intact. Facial sensation intact. Face, tongue, palate moves normally and symmetrically.  Motor:  Normal bulk and tone. Normal strength in all tested extremity muscles. Sensory.:  Decreased sensation on right upper and lower extremity Coordination: Rapid alternating movements normal in all extremities. Finger-to-nose and heel-to-shin performed accurately bilaterally. Gait and Station: Arises from chair without difficulty. Stance is normal. Gait demonstrates normal stride length and balance with assistance of cane and stiffened right leg. Able to heel, toe and tandem walk without difficulty.  Reflexes: 1+ and symmetric. Toes downgoing.    NIHSS  1 Modified Rankin  1   Diagnostic Data (Labs, Imaging, Testing)  CT HEAD WO CONTRAST 07/27/17 IMPRESSION: 1. No evidence of acute intracranial abnormality. 2. ASPECTS is 10.  CT ANGIO HEAD W OR WO CONTRAST CT ANGIO NECK W OR WO CONTRAST 07/27/17 IMPRESSION: 1. No emergent large vessel occlusion. 2. Intracranial atherosclerosis resulting in moderate left M1 and mild right V4 stenoses. 3. Widely patent carotid arteries. 4.  Aortic Atherosclerosis (ICD10-I70.0).  MR BRAIN WO CONTRAST 07/28/17 IMPRESSION: 1 cm acute infarction in the left lateral thalamus.  Subcentimeter acute infarction in the posterior inferior cerebellum on the left and in the superior cerebellum on the right. Punctate acute infarction within the right caudate/adjacent white matter tracks. Anterior and posterior circulation insults suggest embolic disease from the heart or ascending aorta.  ECHOCARDIOGRAM 07/28/17 Study Conclusions - Left ventricle: The cavity size was normal. Wall thickness was   increased in a pattern of mild LVH. Systolic function was   vigorous. The estimated ejection fraction was in the range of 65%   to 70%. Wall motion was normal; there were no regional wall   motion abnormalities. Doppler parameters are consistent with   abnormal left ventricular relaxation (grade 1 diastolic   dysfunction). - Aortic valve: There was mild stenosis. There was mild   regurgitation. Peak velocity (S): 245 cm/s. Mean gradient (S): 12   mm Hg. Valve area (VTI): 1.55 cm^2. Valve area (Vmax): 1.52 cm^2.   Valve area (Vmean): 1.62 cm^2. - Mitral valve: Severely calcified annulus. - Tricuspid valve: There was trivial regurgitation. Impressions: - No cardiac source of emboli was indentified.  ECHO TEE 07/30/17 Study Conclusions - Left ventricle: The cavity size was normal. Wall thickness was   increased in a pattern of moderate LVH. Systolic function was   normal. The estimated ejection fraction was in the range of 55%   to 60%. Diffuse hypokinesis. - Aortic valve: Moderate aortic valve calcification with mild   regurgitation and no stenosis. - Aorta: Grade 3 plaque in the descending thoracic aorta and arch. - Mitral valve: There was mild regurgitation. Moderate   calcification of the mitral valve and annulus but no stenosis. - Left atrium: Normal left atrial size, no LA appendage thrombus. - Right ventricle: The cavity size was normal. Systolic function   was normal. - Right atrium: No evidence of thrombus in the atrial cavity or   appendage. - Atrial septum: No  PFO/ASD by color doppler. Echo contrast study   showed no right-to-left atrial level shunt, at baseline or with   provocation. Impressions: - No cardiac source of emboli was indentified.    ASSESSMENT: Chester Floriano is a 84 y.o. year old female here with bilateral small embolic infarcts on 07/27/17 secondary to embolic due to unknown source. Vascular risk factors include HTN, HLD, and CAD.    PLAN: -Continue aspirin 81 mg daily  and Lipitor for secondary stroke prevention -F/u with PCP regarding your HLD and HTN management -Highly recommended for patient to monitor BP at home  as elevated at this appointment -Continue current home PT/OT/ST -Ensure cardiac monitor continues and should be completed on 09/19/2017 for a total of 30 days monitoring to rule out atrial fibrillation -Maintain strict control of hypertension with blood pressure goal below 130/90, diabetes with hemoglobin A1c goal below 6.5% and cholesterol with LDL cholesterol (bad cholesterol) goal below 70 mg/dL. I also advised the patient to eat a healthy diet with plenty of whole grains, cereals, fruits and vegetables, exercise regularly and maintain ideal body weight.  Follow up in 6 months or call earlier if needed   Greater than 50% of time during this 25 minute visit was spent on counseling,explanation of diagnosis of bilateral embolic infarcts, reviewing risk factor management of HTN and HLD, planning of further management, discussion with patient and family and coordination of care   George Hugh, [facility]  [facility] [address] [place], [place] [address]  Phone [phone] Fax [phone]

## 2017-09-10 NOTE — Patient Instructions (Addendum)
Continue aspirin 81 mg daily  and lipitor  for secondary stroke prevention  Continue to follow up with PCP regarding cholesterol and blood pressure management   Start to monitor blood pressure at home and if continues to be elevated, follow up with PCP for possible medication management  Continue to stay active and eat healthy  Continue home therapies   Maintain strict control of hypertension with blood pressure goal below 130/90, diabetes with hemoglobin A1c goal below 6.5% and cholesterol with LDL cholesterol (bad cholesterol) goal below 70 mg/dL. I also advised the patient to eat a healthy diet with plenty of whole grains, cereals, fruits and vegetables, exercise regularly and maintain ideal body weight.  Followup in the future with me in 6 months or call earlier if needed       Thank you for coming to see [place] at [facility]. I hope we have been able to provide you high quality care today.  You may receive a patient satisfaction survey over the next few weeks. We would appreciate your feedback and comments so that we may continue to improve ourselves and the health of our patients.

## 2017-09-13 NOTE — Progress Notes (Signed)
I agree with the above plan 

## 2017-09-15 ENCOUNTER — Telehealth: Payer: Self-pay | Admitting: *Deleted

## 2017-09-15 NOTE — Telephone Encounter (Signed)
[name] called to continue seeing Erin Erin Patterson 3RA0.  Approval given. Also, Amber reports that Erin Patterson has several blisters on his inner thigh, three on right and one on left.  Family says they have been there about three days.  Please advise.

## 2017-09-15 NOTE — Telephone Encounter (Signed)
Is the patient smoking?

## 2017-09-16 NOTE — Telephone Encounter (Signed)
No, no one in the house is smoking.  Areas do not hurt or itch.

## 2017-09-16 NOTE — Telephone Encounter (Signed)
Would just monitor, if they break apply triple antibiotic ointment  and gauze dressing

## 2017-09-16 NOTE — Telephone Encounter (Signed)
Amber notified

## 2017-09-26 ENCOUNTER — Other Ambulatory Visit: Payer: Self-pay | Admitting: Physical Medicine & Rehabilitation

## 2017-09-26 ENCOUNTER — Ambulatory Visit: Payer: Medicaid Other | Admitting: Physical Medicine & Rehabilitation

## 2017-09-26 ENCOUNTER — Telehealth: Payer: Self-pay | Admitting: *Deleted

## 2017-09-26 MED ORDER — LISINOPRIL 10 MG PO TABS: 10.0000 mg | ORAL_TABLET | Freq: Every day | ORAL | 0 refills | Status: DC

## 2017-09-26 NOTE — Telephone Encounter (Signed)
Patient and family walked into clinic today. Non english speaking with no interpreter.  They presented Erin Patterson with a empty medication vial labeled lisinopril. They conveyed they needed a refill. We consulted patients chart and determined the best option would be to refill medication on a one time courtesy refill basis as Dr. Wynn Banker is on vacation. They have a hospital f/u/ transitional care visit on August 6th with Dr. Wynn Banker.....FYI

## 2017-09-29 NOTE — Telephone Encounter (Signed)
DR Julio Sicks is listed as PCP all refill to him

## 2017-09-30 ENCOUNTER — Ambulatory Visit (HOSPITAL_BASED_OUTPATIENT_CLINIC_OR_DEPARTMENT_OTHER): Payer: Medicaid Other | Admitting: Physical Medicine & Rehabilitation

## 2017-09-30 ENCOUNTER — Encounter: Payer: Medicaid Other | Attending: Registered Nurse

## 2017-09-30 ENCOUNTER — Other Ambulatory Visit: Payer: Self-pay

## 2017-09-30 ENCOUNTER — Encounter: Payer: Self-pay | Admitting: Physical Medicine & Rehabilitation

## 2017-09-30 VITALS — BP 185/105 | HR 74 | Ht 61.0 in | Wt 117.4 lb

## 2017-09-30 DIAGNOSIS — Z8673 Personal history of transient ischemic attack (TIA), and cerebral infarction without residual deficits: Secondary | ICD-10-CM | POA: Diagnosis not present

## 2017-09-30 DIAGNOSIS — G479 Sleep disorder, unspecified: Secondary | ICD-10-CM | POA: Insufficient documentation

## 2017-09-30 DIAGNOSIS — I6340 Cerebral infarction due to embolism of unspecified cerebral artery: Secondary | ICD-10-CM

## 2017-09-30 DIAGNOSIS — I252 Old myocardial infarction: Secondary | ICD-10-CM | POA: Insufficient documentation

## 2017-09-30 DIAGNOSIS — E785 Hyperlipidemia, unspecified: Secondary | ICD-10-CM | POA: Insufficient documentation

## 2017-09-30 DIAGNOSIS — I10 Essential (primary) hypertension: Secondary | ICD-10-CM | POA: Insufficient documentation

## 2017-09-30 DIAGNOSIS — Z7902 Long term (current) use of antithrombotics/antiplatelets: Secondary | ICD-10-CM | POA: Diagnosis not present

## 2017-09-30 DIAGNOSIS — Z09 Encounter for follow-up examination after completed treatment for conditions other than malignant neoplasm: Secondary | ICD-10-CM | POA: Insufficient documentation

## 2017-09-30 NOTE — Patient Instructions (Signed)
Please buy a pill box to help with medication

## 2017-09-30 NOTE — Progress Notes (Signed)
Subjective:    Patient ID: Erin Patterson, female    DOB: 1932-03-28, 84 y.o.   MRN: [id number] 84 year old [place] female with history of HTN, CAD, CVA with mild residual R-HP and memory problems who was admitted on 07/26/2017 with HA, dizziness, difficulty walking with near falls and hypertensive emergency with BP 190/94 at admission. She was noted to be confused with right sided weakness and MRI brain done revealing acute left thalamic infarct, subcentimeter acute infarct in left and superior right cerebellum suggestive of emboli form heart or aorta. TEE done on 06/5 revealing moderate calcifications on MV and AV with grade 3 plaque in descending thoracic aorta and arch and was negative for PFO/ASD. Dr. Roda Shutters felt that bilateral strokes due to emboli from unknown source but suspicious for A fib".  She was started on DAPT X 3 weeks followed by ASA alone. She refused loop recorder and 30 day event monitor recommended after discharge. Patient with deficits in mobility and self care tasks therefore CIR recommended for follow up therapy.    Admit date: 07/30/2017 Discharge date: 08/08/2017  HPI   Patient is accompanied by [place] language interpreter.  Patient does not speak any English  Memory issues persist, daughter still worried about patient using stove ,does not allow patient to cook with the stove or oven  No falls at home.  Continues to receive home health PT OT, speech therapy  Has not seen PCP, we discussed that her Lipitor and lisinopril will need to be filled by PCP or cardiology.  Patient did come into clinic and meds were refilled.  Has seen cardiology and neuro  Discussed med compliance, patient states she cannot remember to take her medications.  Patient continues to complain with some right-sided weakness Pain Inventory Average Pain 0 Pain Right Now 0 My pain is no pain  In the last 24 hours, has pain interfered with the following? General activity 0 Relation with  others 0 Enjoyment of life 0 What TIME of day is your pain at its worst? no pain Sleep (in general) Poor  Pain is worse with: no pain Pain improves with: no pain Relief from Meds: no meds  Mobility walk with assistance use a cane how many minutes can you walk? 10 ability to climb steps?  no do you drive?  no  Function retired  Neuro/Psych trouble walking  Prior Studies Any changes since last visit?  no  Physicians involved in your care Any changes since last visit?  no   No family history on file. Social History   Socioeconomic History  . Marital status: Married    Spouse name: Not on file  . Number of children: Not on file  . Years of education: Not on file  . Highest education level: Not on file  Occupational History  . Not on file  Social Needs  . Financial resource strain: Not on file  . Food insecurity:    Worry: Not on file    Inability: Not on file  . Transportation needs:    Medical: Not on file    Non-medical: Not on file  Tobacco Use  . Smoking status: Never Smoker  . Smokeless tobacco: Never Used  Substance and Sexual Activity  . Alcohol use: Never    Frequency: Never  . Drug use: Never  . Sexual activity: Not on file  Lifestyle  . Physical activity:    Days per week: Not on file    Minutes per session: Not on  file  . Stress: Not on file  Relationships  . Social connections:    Talks on phone: Not on file    Gets together: Not on file    Attends religious service: Not on file    Active member of club or organization: Not on file    Attends meetings of clubs or organizations: Not on file    Relationship status: Not on file  Other Topics Concern  . Not on file  Social History Narrative  . Not on file   Past Surgical History:  Procedure Laterality Date  . TEE WITHOUT CARDIOVERSION N/A 07/30/2017   Procedure: TRANSESOPHAGEAL ECHOCARDIOGRAM (TEE);  Surgeon: Laurey Morale, MD;  Location: [facility] ENDOSCOPY;  Service: Cardiovascular;   Laterality: N/A;   Past Medical History:  Diagnosis Date  . Hypercholesteremia   . Hypertension   . MI (myocardial infarction) (HCC)   . Stroke (cerebrum) (HCC)    years ago--episode of "bleeding from right ear and onset of weakness"    BP (!) 185/105   Pulse 74   Ht 5\' 1"  (1.549 m)   Wt 117 lb 6.4 oz (53.3 kg)   SpO2 97%   BMI 22.18 kg/m   Opioid Risk Score:   Fall Risk Score:  `1  Depression screen PHQ 2/9  Depression screen [facility] 2/9 09/30/2017 08/20/2017  Decreased Interest 0 0  Down, Depressed, Hopeless 0 0  PHQ - 2 Score 0 0  Altered sleeping - 1  Tired, decreased energy - 3  Change in appetite - 1  Feeling bad or failure about yourself  - 0  Trouble concentrating - 0  Moving slowly or fidgety/restless - 1  Suicidal thoughts - 0  PHQ-9 Score - 6    Review of Systems  Constitutional: Negative.   HENT: Negative.   Eyes: Negative.   Respiratory: Negative.   Cardiovascular: Negative.   Gastrointestinal: Negative.   Endocrine: Negative.   Genitourinary: Negative.   Musculoskeletal: Negative.   Skin: Negative.   Allergic/Immunologic: Negative.   Neurological: Negative.   Hematological: Negative.   Psychiatric/Behavioral: Negative.   All other systems reviewed and are negative.      Objective:   Physical Exam  Constitutional: She appears well-developed and well-nourished. No distress.  HENT:  Head: Normocephalic and atraumatic.  Eyes: Pupils are equal, round, and reactive to light. EOM are normal.  Cardiovascular: Normal rate, regular rhythm, normal heart sounds and intact distal pulses.  Pulmonary/Chest: Effort normal and breath sounds normal.  Abdominal: Soft. Bowel sounds are normal. She exhibits no distension. There is no tenderness.  Musculoskeletal: Normal range of motion.  Neurological: She displays no atrophy and no tremor. She exhibits normal muscle tone.  Diminished fine motor right upper extremity finger to thumb opposition Mild  dysdiadochokinesis with rapid alternating supination pronation of the right upper extremity.   Skin: Skin is warm and dry. She is not diaphoretic.  Psychiatric: She has a normal mood and affect.  Nursing note and vitals reviewed.   Motor strength Right upper extremity 4+ in the deltoid bicep tricep grip Right lower extremity 4/5 in the right hip flexor knee extensor ankle dorsiflex 5/5 in left upper and left lower limb Sensation mildly diminished on the right      Assessment & Plan:  1.  Mild residual right hemiparesis left thalamic infarct.  Overall patient is modified independent level.  She has some residual fine motor deficits as well.  Her main functional limitation is her memory.  She has had  several cerebral, subcortical as well as cerebellar infarcts noted on magnetic residence imaging.  This is likely going to be a chronic issue. She will follow-up with primary care and cardiology.  They will prescribe and manage the Lipitor and lisinopril.  I suggested using a pillbox to help with compliance given that the patient forgets to take her medications. Patient will also follow-up with neurology in approximately 6 months. Physical medicine rehab follow-up on as-needed basis.

## 2017-10-02 ENCOUNTER — Telehealth: Payer: Self-pay | Admitting: *Deleted

## 2017-10-02 NOTE — Telephone Encounter (Signed)
Called Language interpreter line, reached [place] interpreter [phone]. No one answered the call to [facility], listed on patient's DPR as her pastor. Per this RN, he left a VM for [name] Le requesting he call nurse Corrie Dandy in reference to patient.

## 2017-10-02 NOTE — Progress Notes (Signed)
Can you please notify patient of this. Thank you.

## 2017-10-02 NOTE — Telephone Encounter (Signed)
Received call back for patient's pastor. Advised him that her cardiac (heart) monitor showed no irregular heart beats, please continue taking medicines as prescribed and follow up with her primary care for BP and lipid control. He repeated results accurately, stated he would notify the patient, verbalzied understanding, appreciation.

## 2017-10-03 ENCOUNTER — Encounter: Payer: Self-pay | Admitting: Cardiology

## 2017-10-03 ENCOUNTER — Ambulatory Visit (INDEPENDENT_AMBULATORY_CARE_PROVIDER_SITE_OTHER): Payer: Medicaid Other | Admitting: Cardiology

## 2017-10-03 VITALS — BP 182/102 | HR 65 | Ht 61.0 in | Wt 118.0 lb

## 2017-10-03 DIAGNOSIS — I6340 Cerebral infarction due to embolism of unspecified cerebral artery: Secondary | ICD-10-CM | POA: Diagnosis not present

## 2017-10-03 DIAGNOSIS — I2510 Atherosclerotic heart disease of native coronary artery without angina pectoris: Secondary | ICD-10-CM | POA: Diagnosis not present

## 2017-10-03 DIAGNOSIS — I639 Cerebral infarction, unspecified: Secondary | ICD-10-CM | POA: Diagnosis not present

## 2017-10-03 DIAGNOSIS — I10 Essential (primary) hypertension: Secondary | ICD-10-CM

## 2017-10-03 MED ORDER — LISINOPRIL 20 MG PO TABS: 20.0000 mg | ORAL_TABLET | Freq: Every day | ORAL | 11 refills | Status: DC

## 2017-10-03 NOTE — Patient Instructions (Signed)
Medication Instructions:  Your physician has recommended you make the following change in your medication:  INCREASE Lisinopril to 20mg  daily. An Rx has been sent to your pharmacy   Labwork: None ordered  Testing/Procedures: None ordered  Follow-Up: Your physician recommends that you schedule a follow-up appointment in: 6-8 weeks with Dr.Christopher or the Pharm D   Any Other Special Instructions Will Be Listed Below (If Applicable).     If you need a refill on your cardiac medications before your next appointment, please call your pharmacy.

## 2017-10-03 NOTE — Progress Notes (Signed)
Cardiology Office Note:    Date:  10/03/2017   ID:  Erin Patterson, DOB [birth year], MRN [id number]  PCP:  Jackie Plum, MD  Cardiologist:  Jodelle Red, MD PhD  Referring MD: Jacquelynn Cree, PA-C   Chief Complaint  Patient presents with  . New evaluation    pt states she was in the hospital, she had a stroke and is having some problems with memory; c/o fatigue and mild swelling in both legs; states left side is very weak.    History of Present Illness:    Erin Patterson is a 84 y.o. female with a hx of ischemic CVA, CAD s/p MI, hypertension, hyperlipidemia who is seen as a new patient at the request of Delle Reining for evaluation of arrhythmia post CVA. She is accompanied by an interpreter today.  Discharge summary from 08/08/17 states that she presented with headache, dizziness, walking difficulty with near fall, and hypertensive emergency (190/94) who was found to have acute left thalamic infarct and left/right cerebellar infarct. Concern was for cardiovascular source. TTE/TEE unrevealing for source, though did show moderate MV/AV calcifications and severe atherosclerosis in descending aorta. Monitor was placed and did not show any atrial fibrillation. Plan per neurology was for three weeks of DAPT (ending 08/20/17) and then continuing aspirin alone. She was discharged on 5 mg of lisinopril, and it appears that this has already been increased once, as she is on 10 mg of lisinopril on arrival today.  Patient today denies chest pain, shortness of breath. Does have some chronic vision changes and weakness now after her prior strokes. Has noticed occasional LE edema. Has PT coming to her house, and they check her blood pressure. She reports difficulty in remembering to take her blood pressure medications. She had not taken today's medication until she was here in the office. She is not sure how often she misses doses of medication. She has been told that her blood pressure is high even  when she is taking the medication. Unclear what her diet is at home. Per notes, she had blood pressures in the 130s/80s on lisinopril 5 mg while in rehabilitation.   Past Medical History:  Diagnosis Date  . Hypercholesteremia   . Hypertension   . MI (myocardial infarction) (HCC)   . Stroke (cerebrum) (HCC)    years ago--episode of "bleeding from right ear and onset of weakness"     Past Surgical History:  Procedure Laterality Date  . TEE WITHOUT CARDIOVERSION N/A 07/30/2017   Procedure: TRANSESOPHAGEAL ECHOCARDIOGRAM (TEE);  Surgeon: Laurey Morale, MD;  Location: [facility] ENDOSCOPY;  Service: Cardiovascular;  Laterality: N/A;    Current Medications: Current Outpatient Medications on File Prior to Visit  Medication Sig  . aspirin 81 MG EC tablet Take 1 tablet (81 mg total) by mouth daily.  Marland Kitchen atorvastatin (LIPITOR) 40 MG tablet Take 1 tablet (40 mg total) by mouth daily at 6 PM.   No current facility-administered medications on file prior to visit.    lisinopril 10 mg daily  Allergies:   Patient has no known allergies.   Social History   Socioeconomic History  . Marital status: Married    Spouse name: Not on file  . Number of children: Not on file  . Years of education: Not on file  . Highest education level: Not on file  Occupational History  . Not on file  Social Needs  . Financial resource strain: Not on file  . Food insecurity:    Worry: Not  on file    Inability: Not on file  . Transportation needs:    Medical: Not on file    Non-medical: Not on file  Tobacco Use  . Smoking status: Never Smoker  . Smokeless tobacco: Never Used  Substance and Sexual Activity  . Alcohol use: Never    Frequency: Never  . Drug use: Never  . Sexual activity: Not on file  Lifestyle  . Physical activity:    Days per week: Not on file    Minutes per session: Not on file  . Stress: Not on file  Relationships  . Social connections:    Talks on phone: Not on file    Gets together: Not  on file    Attends religious service: Not on file    Active member of club or organization: Not on file    Attends meetings of clubs or organizations: Not on file    Relationship status: Not on file  Other Topics Concern  . Not on file  Social History Narrative  . Not on file     Family History: The patient's family history is not on file. No history on her older family members. Has 8 children, 1 deceased at age 36 mos.  ROS:   Please see the history of present illness.  Additional pertinent ROS: Review of Systems  Constitutional: Negative for chills, fever and weight loss.  HENT: Negative for ear pain and hearing loss.   Eyes: Positive for blurred vision. Negative for pain.  Respiratory: Negative for cough and shortness of breath.   Cardiovascular: Positive for leg swelling. Negative for chest pain, palpitations, orthopnea, claudication and PND.  Gastrointestinal: Negative for abdominal pain, blood in stool and melena.  Genitourinary: Negative for hematuria.  Musculoskeletal: Negative for joint pain and myalgias.  Skin: Negative for rash.  Neurological: Positive for focal weakness. Negative for loss of consciousness.  Endo/Heme/Allergies: Does not bruise/bleed easily.     EKGs/Labs/Other Studies Reviewed:    The following studies were reviewed today: Monitor 10/01/17 (resulted) Sinus rhythm No AV block or pauses No sustained arrhythmias No atrial fibrillation  TEE 07/30/17 Left ventricle: The cavity size was normal. Wall thickness was   increased in a pattern of moderate LVH. Systolic function was   normal. The estimated ejection fraction was in the range of 55%   to 60%. Diffuse hypokinesis. - Aortic valve: Moderate aortic valve calcification with mild   regurgitation and no stenosis. - Aorta: Grade 3 plaque in the descending thoracic aorta and arch. - Mitral valve: There was mild regurgitation. Moderate   calcification of the mitral valve and annulus but no stenosis. -  Left atrium: Normal left atrial size, no LA appendage thrombus. - Right ventricle: The cavity size was normal. Systolic function   was normal. - Right atrium: No evidence of thrombus in the atrial cavity or   appendage. - Atrial septum: No PFO/ASD by color doppler. Echo contrast study   showed no right-to-left atrial level shunt, at baseline or with   provocation.  Impressions:  - No cardiac source of emboli was indentified.  TTE 07/28/17 Left ventricle: The cavity size was normal. Wall thickness was   increased in a pattern of mild LVH. Systolic function was   vigorous. The estimated ejection fraction was in the range of 65%   to 70%. Wall motion was normal; there were no regional wall   motion abnormalities. Doppler parameters are consistent with   abnormal left ventricular relaxation (grade 1 diastolic  dysfunction). - Aortic valve: There was mild stenosis. There was mild   regurgitation. Peak velocity (S): 245 cm/s. Mean gradient (S): 12   mm Hg. Valve area (VTI): 1.55 cm^2. Valve area (Vmax): 1.52 cm^2.   Valve area (Vmean): 1.62 cm^2. - Mitral valve: Severely calcified annulus. - Tricuspid valve: There was trivial regurgitation.  Impressions:  - No cardiac source of emboli was indentified.  EKG:  EKG is ordered today.  The ekg ordered today demonstrates normal sinus rhythm, LVH with repolarization changes  Recent Labs: 07/26/2017: B Natriuretic Peptide 385.4; Magnesium 1.7; TSH 0.762 08/04/2017: ALT 103 08/07/2017: Hemoglobin 11.9; Platelets 232 08/20/2017: BUN 15; Creatinine, Ser 0.76; Potassium 4.5; Sodium 144  Recent Lipid Panel    Component Value Date/Time   CHOL 201 (H) 07/27/2017 0712   TRIG 44 07/27/2017 0712   HDL 66 07/27/2017 0712   CHOLHDL 3.0 07/27/2017 0712   VLDL 9 07/27/2017 0712   LDLCALC 126 (H) 07/27/2017 0712    Physical Exam:    VS:  BP (!) 182/102 (BP Location: Right Arm)   Pulse 65   Ht 5\' 1"  (1.549 m)   Wt 118 lb (53.5 kg)   BMI 22.30  kg/m     Wt Readings from Last 3 Encounters:  10/03/17 118 lb (53.5 kg)  09/30/17 117 lb 6.4 oz (53.3 kg)  09/10/17 116 lb 6.4 oz (52.8 kg)     GEN: Frail appearing, in no acute distress HEENT: Normal NECK: No JVD; No carotid bruits LYMPHATICS: No lymphadenopathy CARDIAC: regular rhythm, normal S1 and S2, no rubs, gallops. 2/6 early systolic and 2/6 holosystolic murmurs heard. Radial and DP pulses 2+ bilaterally. RESPIRATORY:  Clear to auscultation without rales, wheezing or rhonchi  ABDOMEN: Soft, non-tender, non-distended MUSCULOSKELETAL:  No edema; No deformity  SKIN: Warm and dry NEUROLOGIC:  Decreased strength on RUE and RLE side. PSYCHIATRIC:  Normal affect  ASSESSMENT:    1. Benign essential HTN   2. Hypertension, poor control   3. Coronary artery disease involving native heart without angina pectoris, unspecified vessel or lesion type   4. Ischemic cerebrovascular accident (CVA) (HCC)   5. Cerebrovascular accident (CVA) due to embolism of cerebral artery (HCC)    PLAN:    1. Hypertension: very poorly controlled. Difficult to assess how often she is actually taking her medication. She thinks that her BP has been elevated even when she does remember to take her medication. However, she appeared to be near her BP goal while in the hospital for stroke rehab. Do not want to overprescribe if this is nonadherence, but given the amount of elevation of her BP, suspect that she needs a higher dose of lisinopril to maintain control. Recent renal function normal. -Epworth sleep apnea score is 0. Do not suspect component of sleep apnea -increased lisinopril to 20 mg daily. Would avoid increasing any further unless we can clearly verify that she has been taking this dose routinely and still has elevated BP -recheck renal panel at next visit. She does not think she has a primary care doctor. She does not see neurology until January. Will have her follow up here in the office in 6-8 weeks  to monitor BP -discussed strategies to try to assist with adherence. No family members present today at visit. May need medication assistance at home.  2. History of prior CVAs: most recent appeared to match embolic pattern. No cardiac etiology noted, negative TTE/TEE/monitor. Did have significant atherosclerosis on TEE. -continue aspirin, statin -BP management as  above -has residual deficits, receiving home PT  Plan for follow up: 6-8 weeks to monitor response to medication changes, verify and assist with adherence strategies, and recheck BMET.  Medication Adjustments/Labs and Tests Ordered: Current medicines are reviewed at length with the patient today.  Concerns regarding medicines are outlined above.  Orders Placed This Encounter  Procedures  . EKG 12-Lead   Meds ordered this encounter  Medications  . lisinopril (PRINIVIL,ZESTRIL) 20 MG tablet    Sig: Take 1 tablet (20 mg total) by mouth daily.    Dispense:  30 tablet    Refill:  11    Patient Instructions  Medication Instructions:  Your physician has recommended you make the following change in your medication:  INCREASE Lisinopril to 20mg  daily. An Rx has been sent to your pharmacy   Labwork: None ordered  Testing/Procedures: None ordered  Follow-Up: Your physician recommends that you schedule a follow-up appointment in: 6-8 weeks with Dr.Braxden Lovering or the Pharm D   Any Other Special Instructions Will Be Listed Below (If Applicable).     If you need a refill on your cardiac medications before your next appointment, please call your pharmacy.      Signed, Jodelle Red, MD PhD 10/03/2017 11:05 AM     [facility]

## 2017-11-20 ENCOUNTER — Ambulatory Visit (INDEPENDENT_AMBULATORY_CARE_PROVIDER_SITE_OTHER): Payer: Medicaid Other | Admitting: Pharmacist Clinician (PhC)/ Clinical Pharmacy Specialist

## 2017-11-20 ENCOUNTER — Other Ambulatory Visit: Payer: Self-pay | Admitting: Cardiology

## 2017-11-20 VITALS — BP 170/90 | HR 76

## 2017-11-20 DIAGNOSIS — I10 Essential (primary) hypertension: Secondary | ICD-10-CM | POA: Diagnosis not present

## 2017-11-20 MED ORDER — CHLORTHALIDONE 25 MG PO TABS: 25.0000 mg | ORAL_TABLET | Freq: Every day | ORAL | 3 refills | Status: DC

## 2017-11-20 NOTE — Progress Notes (Signed)
     11/21/2017 Erin Patterson 28-Apr-1932 [id number]   HPI:  Erin Patterson is a 84 y.o. female patient of Dr Cristal Deer, with a PMH below who presents today for hypertension clinic evaluation.  In addition to hypertension, her medical history is significant for recent CVA (ischemic June 2019), CAD s/p MI (date unknown), hypertension and hyperlipidemia.  She does not speak [place] and was here today with a friend/translator.    There was some concern about her compliance with medications, but this morning she states that she takes both of her medications (lisinopril and atorvastatin) each morning.     Blood Pressure Goal:  130/80  Current Medications:  Lisinopril 20 mg qd - takes every morning  Social Hx:  No tobacco; no alcohol; coffee in the mornings. Diet:  High in rice, fish, vegetables; does not eat out  Exercise:  Walks at home, no formal exercise  Home BP readings:  Has home cuff, did not bring or record any readings, states many readings 120-130's  Intolerances:   nkda  Labs:  07/2017:  Na 144, K 4.5, Glu 84, BUN 15, SCr 0.76 CrCl 47.4 Wt Readings from Last 3 Encounters:  10/03/17 118 lb (53.5 kg)  09/30/17 117 lb 6.4 oz (53.3 kg)  09/10/17 116 lb 6.4 oz (52.8 kg)   BP Readings from Last 3 Encounters:  11/20/17 (!) 170/90  10/03/17 (!) 182/102  09/30/17 (!) 185/105   Pulse Readings from Last 3 Encounters:  11/20/17 76  10/03/17 65  09/30/17 74    Current Outpatient Medications  Medication Sig Dispense Refill  . aspirin 81 MG EC tablet Take 1 tablet (81 mg total) by mouth daily. 100 tablet 0  . atorvastatin (LIPITOR) 40 MG tablet Take 1 tablet (40 mg total) by mouth daily at 6 PM. 30 tablet 0  . chlorthalidone (HYGROTON) 25 MG tablet Take 1 tablet (25 mg total) by mouth daily. 15 tablet 3  . lisinopril (PRINIVIL,ZESTRIL) 20 MG tablet Take 1 tablet (20 mg total) by mouth daily. 30 tablet 11   No current facility-administered medications for this visit.      No Known Allergies  Past Medical History:  Diagnosis Date  . Hypercholesteremia   . Hypertension   . MI (myocardial infarction) (HCC)   . Stroke (cerebrum) (HCC)    years ago--episode of "bleeding from right ear and onset of weakness"     Blood pressure (!) 170/90, pulse 76. standing 162/88  Benign essential HTN Patient with uncontrolled hypertension, although she states home readings are much better.  Will add chlorthalidone 12.5 mg daily and have her go to the lab in 10-14 days for repeat BMET.  She will follow up with [place] in CVRR in about 4 weeks.   I have asked her to monitor her BP daily at home and she is to write those numbers down and bring, along with her home cuff, to her next appointment.     Phillips Hay PharmD CPP [facility] [address] [place], [place] [address] [phone]

## 2017-11-20 NOTE — Patient Instructions (Addendum)
Return for a a follow up appointment in 4 weeks  Go to the lab in 10-14 days.   Your blood pressure today is 170/90   Check your blood pressure at home daily and keep record of the readings.  Take your BP meds as follows:  Continue taking lisinopril and atorvastatin.  Start chlorthalidone 12.5 mg (1/2 tablet) daily  In the mornings  Bring all of your meds, your BP cuff and your record of home blood pressures to your next appointment.  Exercise as you're able, try to walk approximately 30 minutes per day.  Keep salt intake to a minimum, especially watch canned and prepared boxed foods.  Eat more fresh fruits and vegetables and fewer canned items.  Avoid eating in fast food restaurants.    HOW TO TAKE YOUR BLOOD PRESSURE: . Rest 5 minutes before taking your blood pressure. .  Don't smoke or drink caffeinated beverages for at least 30 minutes before. . Take your blood pressure before (not after) you eat. . Sit comfortably with your back supported and both feet on the floor (don't cross your legs). . Elevate your arm to heart level on a table or a desk. . Use the proper sized cuff. It should fit smoothly and snugly around your bare upper arm. There should be enough room to slip a fingertip under the cuff. The bottom edge of the cuff should be 1 inch above the crease of the elbow. . Ideally, take 3 measurements at one sitting and record the average.  \

## 2017-11-21 ENCOUNTER — Encounter: Payer: Self-pay | Admitting: Pharmacist Clinician (PhC)/ Clinical Pharmacy Specialist

## 2017-11-21 NOTE — Assessment & Plan Note (Signed)
Patient with uncontrolled hypertension, although she states home readings are much better.  Will add chlorthalidone 12.5 mg daily and have her go to the lab in 10-14 days for repeat BMET.  She will follow up with [place] in CVRR in about 4 weeks.   I have asked her to monitor her BP daily at home and she is to write those numbers down and bring, along with her home cuff, to her next appointment.

## 2017-12-05 LAB — BASIC METABOLIC PANEL
BUN / CREAT RATIO: 35 — AB (ref 12–28)
BUN: 33 mg/dL — AB (ref 8–27)
CO2: 24 mmol/L (ref 20–29)
CREATININE: 0.94 mg/dL (ref 0.57–1.00)
Calcium: 9.6 mg/dL (ref 8.7–10.3)
Chloride: 97 mmol/L (ref 96–106)
GFR, EST AFRICAN AMERICAN: 65 mL/min/{1.73_m2} (ref >59)
GFR, EST NON AFRICAN AMERICAN: 56 mL/min/{1.73_m2} — AB (ref >59)
Glucose: 94 mg/dL (ref 65–99)
Potassium: 4 mmol/L (ref 3.5–5.2)
SODIUM: 138 mmol/L (ref 134–144)

## 2017-12-30 ENCOUNTER — Ambulatory Visit (INDEPENDENT_AMBULATORY_CARE_PROVIDER_SITE_OTHER): Payer: Medicaid Other | Admitting: Pharmacist Clinician (PhC)/ Clinical Pharmacy Specialist

## 2017-12-30 DIAGNOSIS — I10 Essential (primary) hypertension: Secondary | ICD-10-CM

## 2017-12-30 MED ORDER — CHLORTHALIDONE 25 MG PO TABS: 12.5000 mg | ORAL_TABLET | Freq: Every day | ORAL | 3 refills | Status: DC

## 2017-12-30 NOTE — Assessment & Plan Note (Addendum)
Patient with some component of white coat hypertension.  Her average home reading from the past 6 weeks is 30 points lower than what we got in the office today.  Will cut the chlorthalidone back to 12.5 mg daily and have her return in another month to see if she is feeling better.  At that time, we may need to consider stopping the chlorthalidone.  Will also repeat BMET at that time to monitor serum creatinine.

## 2017-12-30 NOTE — Assessment & Plan Note (Deleted)
Patient with essential hypertension and some measure of white coat hypertension, as her office reading was 30 points higher systolic than the home average from the past month.  I will have her cut the chlorthalidone to 12.5 mg.  We will see her back in a month for follow up.  At that time I will have her get a follow up metabolic panel.

## 2017-12-30 NOTE — Patient Instructions (Signed)
Return for a a follow up appointment in December  Your blood pressure today is 142/78  Check your blood pressure at home daily and keep record of the readings.  Take your BP meds as follows:  Continue lisinopril  Cut the chlorthalidone tablet in half - take 1/2 tablet each morning  Bring all of your meds, your BP cuff and your record of home blood pressures to your next appointment.  Exercise as you're able, try to walk approximately 30 minutes per day.  Keep salt intake to a minimum, especially watch canned and prepared boxed foods.  Eat more fresh fruits and vegetables and fewer canned items.  Avoid eating in fast food restaurants.    HOW TO TAKE YOUR BLOOD PRESSURE: . Rest 5 minutes before taking your blood pressure. .  Don't smoke or drink caffeinated beverages for at least 30 minutes before. . Take your blood pressure before (not after) you eat. . Sit comfortably with your back supported and both feet on the floor (don't cross your legs). . Elevate your arm to heart level on a table or a desk. . Use the proper sized cuff. It should fit smoothly and snugly around your bare upper arm. There should be enough room to slip a fingertip under the cuff. The bottom edge of the cuff should be 1 inch above the crease of the elbow. . Ideally, take 3 measurements at one sitting and record the average.

## 2017-12-30 NOTE — Progress Notes (Signed)
12/30/2017 Erin Patterson [birth year] [id number]   HPI:  Erin Patterson is a 84 y.o. female patient of Dr Cristal Deer, with a PMH below who presents today for hypertension clinic evaluation.  In addition to hypertension, her medical history is significant for recent CVA (ischemic June 2019), CAD s/p MI (date unknown), hypertension and hyperlipidemia.   At the last visit I recommended starting chlorthalidone 12.5mg , however inadvertently sent in to take 25 mg daily, which she did.  Labs repeated after 14 days showed just over 20% increase in serum creatinine, from 0.76 to 0.94.  Otherwise labs were stable.   Erin Patterson does not speak English and is here today with a Nurse, learning disability.   She reports feeling easily fatigued, will often have to sit and rest.  She does this as she worries about having a fall when she gets so tired.   She has no other changes in medication.   Blood Pressure Goal:  130/80  Current Medications:  Lisinopril 20 mg qd - am  Chlorthalidone 25 mg qd - am  Social Hx:  No tobacco; no alcohol; coffee in the mornings. Diet:  High in rice, fish, vegetables; does not eat out  Exercise:  Walks at home, no formal exercise  Home BP readings:  Home cuff, arm model, about 82 years old - read within 2 points systolic and 10 points diastolic.  She had 39 home readings since last visit with average of 113/71 and range 87-145/58-93.  Only one reading was >130 systolic.     Intolerances:   nkda  Labs:  07/2017:  Na 144, K 4.5, Glu 84, BUN 15, SCr 0.76 CrCl 47.4 Wt Readings from Last 3 Encounters:  10/03/17 118 lb (53.5 kg)  09/30/17 117 lb 6.4 oz (53.3 kg)  09/10/17 116 lb 6.4 oz (52.8 kg)   BP Readings from Last 3 Encounters:  12/30/17 (!) 142/76  11/20/17 (!) 170/90  10/03/17 (!) 182/102   Pulse Readings from Last 3 Encounters:  12/30/17 74  11/20/17 76  10/03/17 65    Current Outpatient Medications  Medication Sig Dispense Refill  . aspirin 81 MG EC tablet Take 1  tablet (81 mg total) by mouth daily. 100 tablet 0  . atorvastatin (LIPITOR) 40 MG tablet Take 1 tablet (40 mg total) by mouth daily at 6 PM. 30 tablet 0  . chlorthalidone (HYGROTON) 25 MG tablet Take 0.5 tablets (12.5 mg total) by mouth daily. 45 tablet 3  . lisinopril (PRINIVIL,ZESTRIL) 20 MG tablet Take 1 tablet (20 mg total) by mouth daily. 30 tablet 11   No current facility-administered medications for this visit.     No Known Allergies  Past Medical History:  Diagnosis Date  . Hypercholesteremia   . Hypertension   . MI (myocardial infarction) (HCC)   . Stroke (cerebrum) (HCC)    years ago--episode of "bleeding from right ear and onset of weakness"     Blood pressure (!) 142/76, pulse 74. standing 162/88  Benign essential HTN Patient with some component of white coat hypertension.  Her average home reading from the past 6 weeks is 30 points lower than what we got in the office today.  Will cut the chlorthalidone back to 12.5 mg daily and have her return in another month to see if she is feeling better.  At that time, we may need to consider stopping the chlorthalidone.  Will also repeat BMET at that time to monitor serum creatinine.   Phillips Hay PharmD CPP  [facility] [address] [place], [place] [address] [phone]

## 2018-01-27 ENCOUNTER — Ambulatory Visit (INDEPENDENT_AMBULATORY_CARE_PROVIDER_SITE_OTHER): Payer: Medicaid Other | Admitting: Pharmacist Clinician (PhC)/ Clinical Pharmacy Specialist

## 2018-01-27 VITALS — BP 138/80 | HR 83 | Resp 17

## 2018-01-27 DIAGNOSIS — I10 Essential (primary) hypertension: Secondary | ICD-10-CM | POA: Diagnosis not present

## 2018-01-27 LAB — BASIC METABOLIC PANEL
BUN / CREAT RATIO: 32 — AB (ref 12–28)
BUN: 29 mg/dL — ABNORMAL HIGH (ref 8–27)
CALCIUM: 9.6 mg/dL (ref 8.7–10.3)
CHLORIDE: 99 mmol/L (ref 96–106)
CO2: 25 mmol/L (ref 20–29)
Creatinine, Ser: 0.92 mg/dL (ref 0.57–1.00)
GFR calc non Af Amer: 58 mL/min/{1.73_m2} — ABNORMAL LOW (ref >59)
GFR, EST AFRICAN AMERICAN: 67 mL/min/{1.73_m2} (ref >59)
Glucose: 93 mg/dL (ref 65–99)
POTASSIUM: 4.2 mmol/L (ref 3.5–5.2)
Sodium: 139 mmol/L (ref 134–144)

## 2018-01-27 NOTE — Patient Instructions (Addendum)
Call if you notice your home blood pressure goes above 140/90 on a regular basis  Go to the lab today for blood work to check your kidney function  Your blood pressure today is 138/80 in the office  Check your blood pressure at home daily (if able) and keep record of the readings.  Take your BP meds as follows:   Lisinopril 20 mg (1 tablet) daily  Chlorthalidone 12.5 mg (1/2 tablet) daily  Bring all of your meds, your BP cuff and your record of home blood pressures to your next appointment.  Exercise as you're able, try to walk approximately 30 minutes per day.  Keep salt intake to a minimum, especially watch canned and prepared boxed foods.  Eat more fresh fruits and vegetables and fewer canned items.  Avoid eating in fast food restaurants.    HOW TO TAKE YOUR BLOOD PRESSURE: . Rest 5 minutes before taking your blood pressure. .  Don't smoke or drink caffeinated beverages for at least 30 minutes before. . Take your blood pressure before (not after) you eat. . Sit comfortably with your back supported and both feet on the floor (don't cross your legs). . Elevate your arm to heart level on a table or a desk. . Use the proper sized cuff. It should fit smoothly and snugly around your bare upper arm. There should be enough room to slip a fingertip under the cuff. The bottom edge of the cuff should be 1 inch above the crease of the elbow. . Ideally, take 3 measurements at one sitting and record the average.

## 2018-01-27 NOTE — Progress Notes (Signed)
     01/27/2018 Erin Patterson 1932/08/09 [id number]   HPI:  Erin Patterson is a 84 y.o. female patient of Dr Cristal Deer, with a PMH below who presents today for hypertension clinic evaluation.  In addition to hypertension, her medical history is significant for recent CVA (ischemic June 2019), CAD s/p MI (date unknown), hypertension and hyperlipidemia.   When she was first seen in [facility] we added chlorthalidone 25 mg daily, which resulted in a 20% increase in SCr.  The dose was cut to 12.5 mg daily at her last visit, which is continues to take.     [name] does not speak English and is here today with a Nurse, learning disability.   She continues to be easily fatigued but overall has no complaints.  No CP, SOB, LEE, dizziness or lightheadedness.   She has no other changes in medication.   Blood Pressure Goal:  130/80  Current Medications:  Lisinopril 20 mg qd - am  Chlorthalidone 12.5 mg qd - am  Social Hx:  No tobacco; no alcohol; coffee in the mornings. Diet:  High in rice, fish, vegetables; does not eat out  Exercise:  Walks at home, no formal exercise  Home BP readings:  Home cuff, arm model, about 82 years old - read within 2 points systolic and 10 points diastolic.   Since her last visit she has 14 readings with an average of 116/75.  This is compared to an average home reading of 113/71 last month.   Intolerances:   nkda  Labs:  07/2017:  Na 144, K 4.5, Glu 84, BUN 15, SCr 0.76 CrCl 47.4 Wt Readings from Last 3 Encounters:  10/03/17 118 lb (53.5 kg)  09/30/17 117 lb 6.4 oz (53.3 kg)  09/10/17 116 lb 6.4 oz (52.8 kg)   BP Readings from Last 3 Encounters:  01/27/18 138/80  12/30/17 (!) 142/76  11/20/17 (!) 170/90   Pulse Readings from Last 3 Encounters:  01/27/18 83  12/30/17 74  11/20/17 76    Current Outpatient Medications  Medication Sig Dispense Refill  . aspirin 81 MG EC tablet Take 1 tablet (81 mg total) by mouth daily. 100 tablet 0  . atorvastatin (LIPITOR) 40 MG tablet  Take 1 tablet (40 mg total) by mouth daily at 6 PM. 30 tablet 0  . chlorthalidone (HYGROTON) 25 MG tablet Take 0.5 tablets (12.5 mg total) by mouth daily. 45 tablet 3  . lisinopril (PRINIVIL,ZESTRIL) 20 MG tablet Take 1 tablet (20 mg total) by mouth daily. 30 tablet 11   No current facility-administered medications for this visit.     No Known Allergies  Past Medical History:  Diagnosis Date  . Hypercholesteremia   . Hypertension   . MI (myocardial infarction) (HCC)   . Stroke (cerebrum) (HCC)    years ago--episode of "bleeding from right ear and onset of weakness"     Blood pressure 138/80, pulse 83, resp. rate 17.   Benign essential HTN Patient with essential hypertension, well controlled at home with verified accurate meter.  She will continue with her current regimen of lisinopril 20 mg and chlorthalidone 12.5 mg, both once daily.   Will repeat BMET today and have her follow up with [facility] as needed if home BP readings become elevated.     Phillips Hay PharmD CPP [facility] [address] [place], [place] [address] [phone]

## 2018-01-27 NOTE — Assessment & Plan Note (Signed)
Patient with essential hypertension, well controlled at home with verified accurate meter.  She will continue with her current regimen of lisinopril 20 mg and chlorthalidone 12.5 mg, both once daily.   Will repeat BMET today and have her follow up with [facility] as needed if home BP readings become elevated.

## 2018-03-18 ENCOUNTER — Ambulatory Visit: Payer: Medicaid Other | Admitting: Adult Health

## 2018-03-18 ENCOUNTER — Telehealth: Payer: Self-pay

## 2018-03-18 NOTE — Progress Notes (Deleted)
[facility] [address] [place] [id number] [phone]       OFFICE FOLLOW UP NOTE  [name] Date of Birth:  12/15/1932 Medical Record Number:  [id number]   Reason for Referral:  hospital stroke follow up  CHIEF COMPLAINT:  No chief complaint on file.   HPI:  Interval history 03/18/2018: Patient is being seen today for follow-up visit and is accompanied by an interpreter.  She continues to do well from a stroke standpoint ***.  Continues on aspirin without side effects of bleeding or bruising.  Continues on atorvastatin without side effects myalgias.  Blood pressure today ***.  30-day cardiac event monitor negative for atrial fibrillation.  Denies new or worsening stroke/TIA symptoms.  09/10/2017 visit: Patient is being seen today for hospital follow-up and is accompanied by interpreter for [place].  Overall she has been recovering well with only mild right-sided weakness and does use cane for assistance.  She continues to receive home PT/OT/ST.  She continues to take aspirin without side effects of bleeding or bruising.  Continues to take Lipitor without side effects myalgias.  Blood pressure elevated at today's appointment 178/87.  Patient does not currently check this at home and it was undetermined if therapies were monitoring this during therapy sessions.  Per notes, 30-day cardiac monitor placed on 08/20/2017.  Patient states the monitor is at home and it was unable to be determined reasoning she did not have it currently on.  Patient's husband and friend were waiting in the waiting room and her friend spoke small amount of [place].  She states she will ensure heart monitor is hooked back up to patient when they return home.  Patient denies new or worsening stroke/TIA symptoms.   Erin Patterson is being seen today for initial visit in the office for bilateral small embolic infarcts on 07/27/17. History obtained from patient and chart review. Reviewed  all radiology images and labs personally.  Erin Patterson is a 85 y.o. female with history of hypertension, hyperlipidemia, coronary artery disease with previous MI and former stroke who presented with worsening right-sided weakness.  CT has been negative for acute stroke.  CTA head and neck reviewed and was negative for emergent large vessel occlusion but did show intracranial atherosclerosis moderate left M1 and mild right V4 stenosis.  MRI reviewed and showed left lateral thalamic, tiny left posterior inferior cerebellar, right superior cerebellar and punctate right caudate white matter infarcts.  2D echo showed an EF of 65 to 70% without cardiac source of embolus.  TEE negative for PFO with negative for cardiac emboli.  Patient refused loop recorder but was agreeable to 30-day cardiac monitor due to embolic infarcts without known source and suspicion for atrial fibrillation.  LDL 126 and Lipitor was increased to 40 mg daily.  Patient was on antithrombotic PTA and recommended DAPT for 3 weeks and then aspirin alone.  Patient was discharged to CIR in stable condition for continued therapies.    ROS:   14 system review of systems performed and negative with exception of weakness and numbness  PMH:  Past Medical History:  Diagnosis Date  . Hypercholesteremia   . Hypertension   . MI (myocardial infarction) (HCC)   . Stroke (cerebrum) (HCC)    years ago--episode of "bleeding from right ear and onset of weakness"     PSH:  Past Surgical History:  Procedure Laterality Date  . TEE WITHOUT CARDIOVERSION N/A 07/30/2017   Procedure: TRANSESOPHAGEAL ECHOCARDIOGRAM (TEE);  Surgeon: Laurey Morale, MD;  Location: [facility] ENDOSCOPY;  Service: Cardiovascular;  Laterality: N/A;    Social History:  Social History   Socioeconomic History  . Marital status: Married    Spouse name: Not on file  . Number of children: Not on file  . Years of education: Not on file  . Highest education level: Not on file    Occupational History  . Not on file  Social Needs  . Financial resource strain: Not on file  . Food insecurity:    Worry: Not on file    Inability: Not on file  . Transportation needs:    Medical: Not on file    Non-medical: Not on file  Tobacco Use  . Smoking status: Never Smoker  . Smokeless tobacco: Never Used  Substance and Sexual Activity  . Alcohol use: Never    Frequency: Never  . Drug use: Never  . Sexual activity: Not on file  Lifestyle  . Physical activity:    Days per week: Not on file    Minutes per session: Not on file  . Stress: Not on file  Relationships  . Social connections:    Talks on phone: Not on file    Gets together: Not on file    Attends religious service: Not on file    Active member of club or organization: Not on file    Attends meetings of clubs or organizations: Not on file    Relationship status: Not on file  . Intimate partner violence:    Fear of current or ex partner: Not on file    Emotionally abused: Not on file    Physically abused: Not on file    Forced sexual activity: Not on file  Other Topics Concern  . Not on file  Social History Narrative  . Not on file    Family History: No family history on file.  Medications:   Current Outpatient Medications on File Prior to Visit  Medication Sig Dispense Refill  . aspirin 81 MG EC tablet Take 1 tablet (81 mg total) by mouth daily. 100 tablet 0  . atorvastatin (LIPITOR) 40 MG tablet Take 1 tablet (40 mg total) by mouth daily at 6 PM. 30 tablet 0  . chlorthalidone (HYGROTON) 25 MG tablet Take 0.5 tablets (12.5 mg total) by mouth daily. 45 tablet 3  . lisinopril (PRINIVIL,ZESTRIL) 20 MG tablet Take 1 tablet (20 mg total) by mouth daily. 30 tablet 11   No current facility-administered medications on file prior to visit.     Allergies:  No Known Allergies   Physical Exam  There were no vitals filed for this visit. There is no height or weight on file to calculate BMI. No exam  data present  General: Frail elderly [place] female, seated, in no evident distress Head: head normocephalic and atraumatic.   Neck: supple with no carotid or supraclavicular bruits Cardiovascular: regular rate and rhythm, no murmurs Musculoskeletal: no deformity Skin:  no rash/petichiae Vascular:  Normal pulses all extremities  Neurologic Exam Mental Status: Awake and fully alert. Oriented to place and time per interpreter. Recent and remote memory intact. Attention span, concentration and fund of knowledge appropriate. Mood and affect appropriate.  Cranial Nerves: Fundoscopic exam reveals sharp disc margins. Pupils equal, briskly reactive to light. Extraocular movements full without nystagmus. Visual fields full to confrontation. Hearing intact. Facial sensation intact. Face, tongue, palate moves normally and symmetrically.  Motor: Normal bulk and tone. Normal strength in all tested extremity  muscles. Sensory.:  Decreased sensation on right upper and lower extremity Coordination: Rapid alternating movements normal in all extremities. Finger-to-nose and heel-to-shin performed accurately bilaterally. Gait and Station: Arises from chair without difficulty. Stance is normal. Gait demonstrates normal stride length and balance with assistance of cane and stiffened right leg. Able to heel, toe and tandem walk without difficulty.  Reflexes: 1+ and symmetric. Toes downgoing.    NIHSS  1 Modified Rankin  1   Diagnostic Data (Labs, Imaging, Testing)  CT HEAD WO CONTRAST 07/27/17 IMPRESSION: 1. No evidence of acute intracranial abnormality. 2. ASPECTS is 10.  CT ANGIO HEAD W OR WO CONTRAST CT ANGIO NECK W OR WO CONTRAST 07/27/17 IMPRESSION: 1. No emergent large vessel occlusion. 2. Intracranial atherosclerosis resulting in moderate left M1 and mild right V4 stenoses. 3. Widely patent carotid arteries. 4.  Aortic Atherosclerosis (ICD10-I70.0).  MR BRAIN WO  CONTRAST 07/28/17 IMPRESSION: 1 cm acute infarction in the left lateral thalamus. Subcentimeter acute infarction in the posterior inferior cerebellum on the left and in the superior cerebellum on the right. Punctate acute infarction within the right caudate/adjacent white matter tracks. Anterior and posterior circulation insults suggest embolic disease from the heart or ascending aorta.  ECHOCARDIOGRAM 07/28/17 Study Conclusions - Left ventricle: The cavity size was normal. Wall thickness was   increased in a pattern of mild LVH. Systolic function was   vigorous. The estimated ejection fraction was in the range of 65%   to 70%. Wall motion was normal; there were no regional wall   motion abnormalities. Doppler parameters are consistent with   abnormal left ventricular relaxation (grade 1 diastolic   dysfunction). - Aortic valve: There was mild stenosis. There was mild   regurgitation. Peak velocity (S): 245 cm/s. Mean gradient (S): 12   mm Hg. Valve area (VTI): 1.55 cm^2. Valve area (Vmax): 1.52 cm^2.   Valve area (Vmean): 1.62 cm^2. - Mitral valve: Severely calcified annulus. - Tricuspid valve: There was trivial regurgitation. Impressions: - No cardiac source of emboli was indentified.  ECHO TEE 07/30/17 Study Conclusions - Left ventricle: The cavity size was normal. Wall thickness was   increased in a pattern of moderate LVH. Systolic function was   normal. The estimated ejection fraction was in the range of 55%   to 60%. Diffuse hypokinesis. - Aortic valve: Moderate aortic valve calcification with mild   regurgitation and no stenosis. - Aorta: Grade 3 plaque in the descending thoracic aorta and arch. - Mitral valve: There was mild regurgitation. Moderate   calcification of the mitral valve and annulus but no stenosis. - Left atrium: Normal left atrial size, no LA appendage thrombus. - Right ventricle: The cavity size was normal. Systolic function   was normal. - Right atrium:  No evidence of thrombus in the atrial cavity or   appendage. - Atrial septum: No PFO/ASD by color doppler. Echo contrast study   showed no right-to-left atrial level shunt, at baseline or with   provocation. Impressions: - No cardiac source of emboli was indentified.    ASSESSMENT: Erin Patterson is a 85 y.o. year old female here with bilateral small embolic infarcts on 07/27/17 secondary to embolic due to unknown source. Vascular risk factors include HTN, HLD, and CAD.    PLAN: -Continue aspirin 81 mg daily  and Lipitor for secondary stroke prevention -F/u with PCP regarding your HLD and HTN management -Highly recommended for patient to monitor BP at home as elevated at this appointment -Continue current home PT/OT/ST -Ensure  cardiac monitor continues and should be completed on 09/19/2017 for a total of 30 days monitoring to rule out atrial fibrillation -Maintain strict control of hypertension with blood pressure goal below 130/90, diabetes with hemoglobin A1c goal below 6.5% and cholesterol with LDL cholesterol (bad cholesterol) goal below 70 mg/dL. I also advised the patient to eat a healthy diet with plenty of whole grains, cereals, fruits and vegetables, exercise regularly and maintain ideal body weight.  Follow up in 6 months or call earlier if needed   Greater than 50% of time during this 25 minute visit was spent on counseling,explanation of diagnosis of bilateral embolic infarcts, reviewing risk factor management of HTN and HLD, planning of further management, discussion with patient and family and coordination of care   George Hugh, [facility]  [facility] [address] [place], [place] [address]  Phone [phone] Fax [phone]

## 2018-03-18 NOTE — Telephone Encounter (Signed)
Pt no show for appt today.

## 2018-03-19 ENCOUNTER — Encounter: Payer: Self-pay | Admitting: Adult Health

## 2018-04-03 ENCOUNTER — Encounter: Payer: Self-pay | Admitting: Adult Health

## 2018-04-03 ENCOUNTER — Ambulatory Visit: Payer: Medicaid Other | Admitting: Adult Health

## 2018-04-03 VITALS — BP 140/80 | HR 77 | Ht 61.0 in | Wt 121.6 lb

## 2018-04-03 DIAGNOSIS — I6340 Cerebral infarction due to embolism of unspecified cerebral artery: Secondary | ICD-10-CM | POA: Diagnosis not present

## 2018-04-03 DIAGNOSIS — R42 Dizziness and giddiness: Secondary | ICD-10-CM | POA: Diagnosis not present

## 2018-04-03 DIAGNOSIS — E785 Hyperlipidemia, unspecified: Secondary | ICD-10-CM | POA: Diagnosis not present

## 2018-04-03 DIAGNOSIS — I10 Essential (primary) hypertension: Secondary | ICD-10-CM

## 2018-04-03 NOTE — Progress Notes (Signed)
[facility] [address] [place]. [place] [address] [phone]       OFFICE FOLLOW UP NOTE  [name] Date of Birth:  30-Sep-1932 Medical Record Number:  [id number]   Reason for Referral:  hospital stroke follow up  CHIEF COMPLAINT:  Chief Complaint  Patient presents with  . Follow-up    6 month follow up. Interpreter present. Rm 9. Patient has some concerns about fatigue.     HPI:  Interval history 04/03/2018: Erin Patterson is being seen today for follow-up visit and is accompanied by an interpreter. She does endorse increased fatigue and dizziness in the past 3-4 days. She denies any type of sickness recently but does endorse recent change in antihypertensives approximately 3 to 4 days ago by her PCP with discontinuing lisinopril and chlorthalidone and initiated lisinopril-hydrochlorothiazide.  This fatigue and dizziness typically occurs when she ambulates short distance.  She does endorse monitoring BP at home but unable to state levels as her husband typically assist with monitoring.  Today's BP 140/80 but has not taken antihypertensives this morning.  She is able to continue ambulating without device and denies any recent falls but does ambulate slowly.  History of cerebellar infarcts but denies dizziness or balance difficulties post stroke.  She was previously seen in hypertension clinic with last appointment 01/27/2018 with recommendations of continuation of current antihypertensives but per patient, recent antihypertensive change initiated by PCP.  She denies any other neurological symptom such as weakness, numbness, speech difficulties or headache.  She continues on aspirin without side effects of bleeding or bruising. Continues on atorvastatin without side effects myalgias. 30-day cardiac event monitor negative for atrial fibrillation.  No further concerns at this time.  09/10/2017 visit: Patient is being seen today for hospital follow-up and is accompanied by  interpreter for [place]  Overall she has been recovering well with only mild right-sided weakness and does use cane for assistance.  She continues to receive home PT/OT/ST.  She continues to take aspirin without side effects of bleeding or bruising.  Continues to take Lipitor without side effects myalgias.  Blood pressure elevated at today's appointment 178/87.  Patient does not currently check this at home and it was undetermined if therapies were monitoring this during therapy sessions.  Per notes, 30-day cardiac monitor placed on 08/20/2017.  Patient states the monitor is at home and it was unable to be determined reasoning she did not have it currently on.  Patient's husband and friend were waiting in the waiting room and her friend spoke small amount of [place].  She states she will ensure heart monitor is hooked back up to patient when they return home.  Patient denies new or worsening stroke/TIA symptoms.   Erin Patterson is being seen today for initial visit in the office for bilateral small embolic infarcts on 07/27/17. History obtained from patient and chart review. Reviewed all radiology images and labs personally.  Erin Patterson is a 85 y.o. female with history of hypertension, hyperlipidemia, coronary artery disease with previous MI and former stroke who presented with worsening right-sided weakness.  CT has been negative for acute stroke.  CTA head and neck reviewed and was negative for emergent large vessel occlusion but did show intracranial atherosclerosis moderate left M1 and mild right V4 stenosis.  MRI reviewed and showed left lateral thalamic, tiny left posterior inferior cerebellar, right superior cerebellar and punctate right caudate white matter infarcts.  2D echo showed an EF of 65 to 70%  without cardiac source of embolus.  TEE negative for PFO with negative for cardiac emboli.  Patient refused loop recorder but was agreeable to 30-day cardiac monitor due to embolic infarcts without  known source and suspicion for atrial fibrillation.  LDL 126 and Lipitor was increased to 40 mg daily.  Patient was on antithrombotic PTA and recommended DAPT for 3 weeks and then aspirin alone.  Patient was discharged to CIR in stable condition for continued therapies.    ROS:   14 system review of systems performed and negative with exception of fatigue, constipation, anemia, memory loss and weakness  PMH:  Past Medical History:  Diagnosis Date  . Hypercholesteremia   . Hypertension   . MI (myocardial infarction) (HCC)   . Stroke (cerebrum) (HCC)    years ago--episode of "bleeding from right ear and onset of weakness"     PSH:  Past Surgical History:  Procedure Laterality Date  . TEE WITHOUT CARDIOVERSION N/A 07/30/2017   Procedure: TRANSESOPHAGEAL ECHOCARDIOGRAM (TEE);  Surgeon: Laurey Morale, MD;  Location: [facility] ENDOSCOPY;  Service: Cardiovascular;  Laterality: N/A;    Social History:  Social History   Socioeconomic History  . Marital status: Married    Spouse name: Not on file  . Number of children: Not on file  . Years of education: Not on file  . Highest education level: Not on file  Occupational History  . Not on file  Social Needs  . Financial resource strain: Not on file  . Food insecurity:    Worry: Not on file    Inability: Not on file  . Transportation needs:    Medical: Not on file    Non-medical: Not on file  Tobacco Use  . Smoking status: Never Smoker  . Smokeless tobacco: Never Used  Substance and Sexual Activity  . Alcohol use: Never    Frequency: Never  . Drug use: Never  . Sexual activity: Not on file  Lifestyle  . Physical activity:    Days per week: Not on file    Minutes per session: Not on file  . Stress: Not on file  Relationships  . Social connections:    Talks on phone: Not on file    Gets together: Not on file    Attends religious service: Not on file    Active member of club or organization: Not on file    Attends meetings of  clubs or organizations: Not on file    Relationship status: Not on file  . Intimate partner violence:    Fear of current or ex partner: Not on file    Emotionally abused: Not on file    Physically abused: Not on file    Forced sexual activity: Not on file  Other Topics Concern  . Not on file  Social History Narrative  . Not on file    Family History: History reviewed. No pertinent family history.  Medications:   Current Outpatient Medications on File Prior to Visit  Medication Sig Dispense Refill  . aspirin 81 MG EC tablet Take 1 tablet (81 mg total) by mouth daily. 100 tablet 0  . atorvastatin (LIPITOR) 40 MG tablet Take 1 tablet (40 mg total) by mouth daily at 6 PM. 30 tablet 0  . lisinopril-hydrochlorothiazide (PRINZIDE,ZESTORETIC) 20-12.5 MG tablet Take 1 tablet by mouth daily.     No current facility-administered medications on file prior to visit.     Allergies:  No Known Allergies   Physical Exam  Vitals:  04/03/18 0806  BP: 140/80  Pulse: 77  Weight: 121 lb 9.6 oz (55.2 kg)  Height: 5\' 1"  (1.549 m)   Body mass index is 22.98 kg/m. No exam data present  General: Frail pleasant elderly [place] female, seated, in no evident distress Head: head normocephalic and atraumatic.   Neck: supple with no carotid or supraclavicular bruits Cardiovascular: regular rate and rhythm, no murmurs Musculoskeletal: no deformity Skin:  no rash/petichiae Vascular:  Normal pulses all extremities  Neurologic Exam Mental Status: Awake and fully alert. Oriented to place and time per interpreter. Recent and remote memory intact. Attention span, concentration and fund of knowledge appropriate. Mood and affect appropriate.  Cranial Nerves:  Extraocular movements full without nystagmus. Visual fields full to confrontation. Hearing intact. Facial sensation intact. Face, tongue, palate moves normally and symmetrically.  Motor: Normal bulk and tone. Normal strength in all tested  extremity muscles. Sensory.:  Sensation equal in all tested extremities Coordination: Rapid alternating movements normal in all extremities. Finger-to-nose and heel-to-shin performed accurately bilaterally. Gait and Station: Arises from chair without difficulty. Stance is slightly hunched.  Gait demonstrates broad-based gait without assistive device Reflexes: 1+ and symmetric. Toes downgoing.      Diagnostic Data (Labs, Imaging, Testing)  CT HEAD WO CONTRAST 07/27/17 IMPRESSION: 1. No evidence of acute intracranial abnormality. 2. ASPECTS is 10.  CT ANGIO HEAD W OR WO CONTRAST CT ANGIO NECK W OR WO CONTRAST 07/27/17 IMPRESSION: 1. No emergent large vessel occlusion. 2. Intracranial atherosclerosis resulting in moderate left M1 and mild right V4 stenoses. 3. Widely patent carotid arteries. 4.  Aortic Atherosclerosis (ICD10-I70.0).  MR BRAIN WO CONTRAST 07/28/17 IMPRESSION: 1 cm acute infarction in the left lateral thalamus. Subcentimeter acute infarction in the posterior inferior cerebellum on the left and in the superior cerebellum on the right. Punctate acute infarction within the right caudate/adjacent white matter tracks. Anterior and posterior circulation insults suggest embolic disease from the heart or ascending aorta.  ECHOCARDIOGRAM 07/28/17 Study Conclusions - Left ventricle: The cavity size was normal. Wall thickness was   increased in a pattern of mild LVH. Systolic function was   vigorous. The estimated ejection fraction was in the range of 65%   to 70%. Wall motion was normal; there were no regional wall   motion abnormalities. Doppler parameters are consistent with   abnormal left ventricular relaxation (grade 1 diastolic   dysfunction). - Aortic valve: There was mild stenosis. There was mild   regurgitation. Peak velocity (S): 245 cm/s. Mean gradient (S): 12   mm Hg. Valve area (VTI): 1.55 cm^2. Valve area (Vmax): 1.52 cm^2.   Valve area (Vmean): 1.62 cm^2. -  Mitral valve: Severely calcified annulus. - Tricuspid valve: There was trivial regurgitation. Impressions: - No cardiac source of emboli was indentified.  ECHO TEE 07/30/17 Study Conclusions - Left ventricle: The cavity size was normal. Wall thickness was   increased in a pattern of moderate LVH. Systolic function was   normal. The estimated ejection fraction was in the range of 55%   to 60%. Diffuse hypokinesis. - Aortic valve: Moderate aortic valve calcification with mild   regurgitation and no stenosis. - Aorta: Grade 3 plaque in the descending thoracic aorta and arch. - Mitral valve: There was mild regurgitation. Moderate   calcification of the mitral valve and annulus but no stenosis. - Left atrium: Normal left atrial size, no LA appendage thrombus. - Right ventricle: The cavity size was normal. Systolic function   was normal. - Right atrium: No  evidence of thrombus in the atrial cavity or   appendage. - Atrial septum: No PFO/ASD by color doppler. Echo contrast study   showed no right-to-left atrial level shunt, at baseline or with   provocation. Impressions: - No cardiac source of emboli was indentified.    ASSESSMENT: Erin Patterson is a 85 y.o. year old female here with bilateral small embolic infarcts on 07/27/17 secondary to embolic due to unknown source. Vascular risk factors include HTN, HLD, and CAD.  She is being seen today for follow-up visit with complaints of 3 to 4-day symptoms of fatigue and dizziness with recent change in antihypertensives 3 to 4 days ago per patient.   PLAN: -Dizziness/fatigue: Likely due to medication changes and possible side effects of new antihypertensives.  Highly encouraged scheduling appointment with PCP or hypertension clinic for need of dose adjustment versus change in therapy.  Due to this recent change, imaging or lab work is not indicated at this time but did recommend continued evaluation with PCP if symptoms do not resolve with  possible need of imaging or lab work -Continue aspirin 81 mg daily  and Lipitor for secondary stroke prevention -F/u with PCP regarding your HLD and HTN management -Advised to monitor BP at home with use of log which was provided at today's appointment.  Highly encouraged scheduling appointment with PCP or hypertension clinic for further management and to ensure she brings her BP logs to appointment -Maintain strict control of hypertension with blood pressure goal below 130/90, diabetes with hemoglobin A1c goal below 6.5% and cholesterol with LDL cholesterol (bad cholesterol) goal below 70 mg/dL. I also advised the patient to eat a healthy diet with plenty of whole grains, cereals, fruits and vegetables, exercise regularly and maintain ideal body weight.  As she is currently stable from a stroke standpoint, recommended to call office or follow-up as needed   Greater than 50% of time during this 25 minute visit was spent on counseling,explanation of diagnosis of bilateral embolic infarcts, discussion regarding likely medication adverse reactions contributing to dizziness and fatigue, reviewing risk factor management of HTN and HLD, planning of further management, discussion with patient and family and coordination of care   George Hugh, [facility]  [facility] [address] [place], [place] [address]  Phone [phone] Fax [phone]

## 2018-04-03 NOTE — Patient Instructions (Signed)
Continue aspirin 81 mg daily  and atorvastatin for secondary stroke prevention  Continue to follow up with PCP regarding cholesterol and blood pressure management   Contact primary care provider for follow up appt regarding recent medication change with symptoms or follow up with hypertension clinic  Most likely these symptoms are related to your recent medication changes due to side effects or potentially decreasing your blood pressure too much  Continue to monitor blood pressure at home and record readings - when you follow up with primary please bring this information to your follow up visit   Maintain strict control of hypertension with blood pressure goal below 130/90, diabetes with hemoglobin A1c goal below 6.5% and cholesterol with LDL cholesterol (bad cholesterol) goal below 70 mg/dL. I also advised the patient to eat a healthy diet with plenty of whole grains, cereals, fruits and vegetables, exercise regularly and maintain ideal body weight.  Followup in the future with me as needed as stable from stroke standpoint or call earlier if needed       Thank you for coming to see [place] at [facility]. I hope we have been able to provide you high quality care today.  You may receive a patient satisfaction survey over the next few weeks. We would appreciate your feedback and comments so that we may continue to improve ourselves and the health of our patients.

## 2018-04-06 NOTE — Progress Notes (Signed)
I agree with the above plan 

## 2019-04-07 IMAGING — MR MR HEAD W/O CM
10 of 11 series · 42 of 48 positions shown · non-contrast
Comparison: CT studies done yesterday.  MRI 07/12/2015.

CLINICAL DATA: Followup stroke. Right-sided weakness presenting
yesterday.

EXAM:
MRI HEAD WITHOUT CONTRAST
TECHNIQUE: Multiplanar, multiecho pulse sequences of the brain and surrounding
structures were obtained without intravenous contrast.

[Series 5: ax dwi_tracew · axial · 3.0mm · 1.50mm/px · z∈[-50,+85]mm · 9 of 80 slices shown]
[im 1/80]
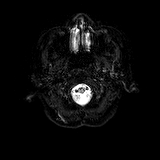
[im 10/80]
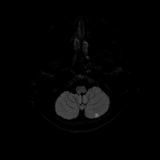
[im 20/80]
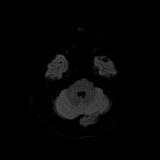
[im 30/80]
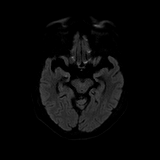
[im 40/80]
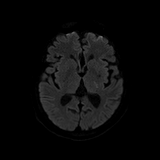
[im 50/80]
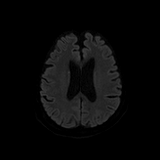
[im 60/80]
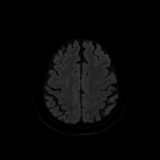
[im 70/80]
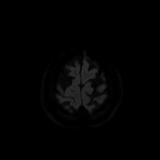
[im 80/80]
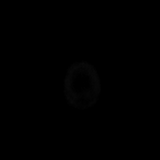

[Series 6: ax dwi_adc · axial · 3.0mm · 1.50mm/px · z∈[-50,+85]mm · 4 of 40 slices shown]
[im 1/40]
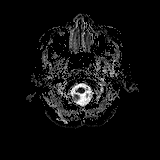
[im 14/40]
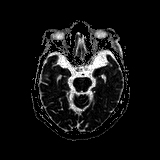
[im 27/40]
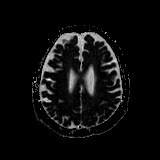
[im 40/40]
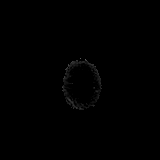

[Series 7: cor dwi_tracew · coronal · 5.0mm · 1.44mm/px · 6 of 64 slices shown]
[im 1/64]
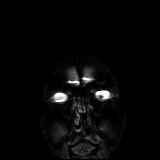
[im 13/64]
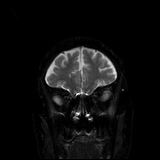
[im 26/64]
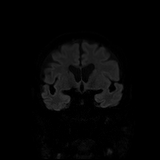
[im 38/64]
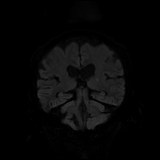
[im 51/64]
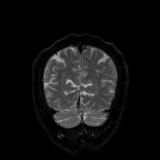
[im 64/64]
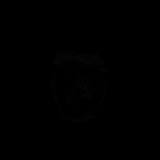

[Series 8: cor dwi_adc · coronal · 5.0mm · 1.44mm/px · 3 of 32 slices shown]
[im 1/32]
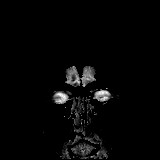
[im 16/32]
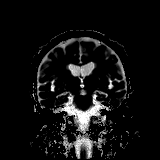
[im 32/32]
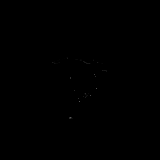

[Series 9: T1 · sagittal · 5.0mm · 0.75mm/px · 2 of 23 slices shown]
[im 1/23]
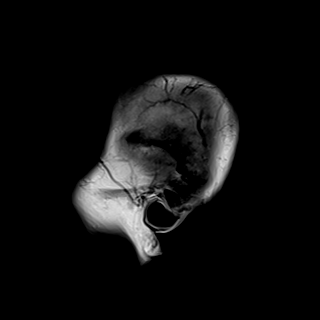
[im 23/23]
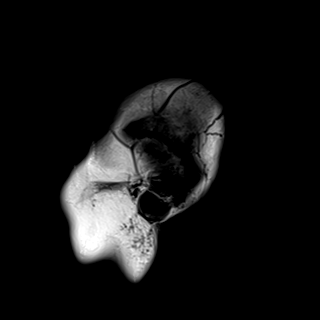

[Series 10: T2 · axial · 5.0mm · 0.69mm/px · z∈[-49,+89]mm · 2 of 25 slices shown (1 of 2)]
[im 1/25]
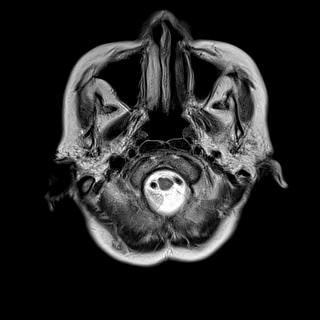
[im 25/25]
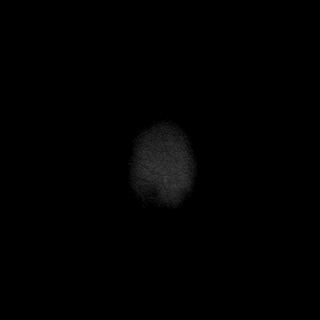

[Series 11: FLAIR · axial · 5.0mm · 0.43mm/px · z∈[-49,+90]mm · 2 of 25 slices shown]
[im 1/25]
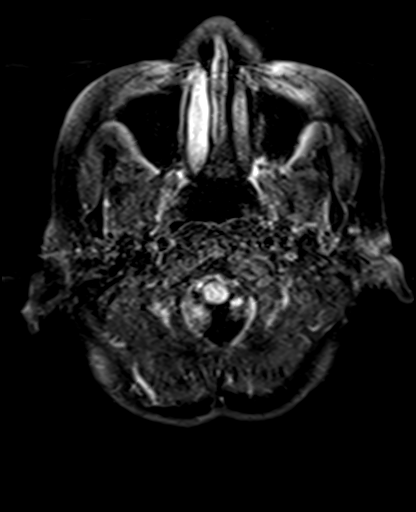
[im 25/25]
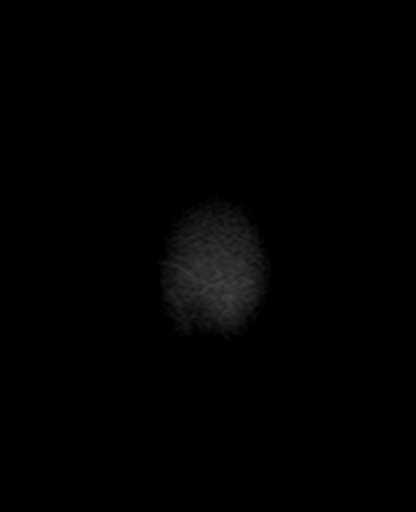

[Series 12: swi_images · axial · 3.0mm · 0.86mm/px · z∈[-65,+105]mm · 6 of 60 slices shown]
[im 1/60]
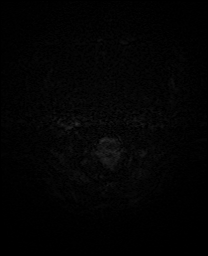
[im 12/60]
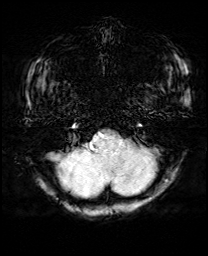
[im 24/60]
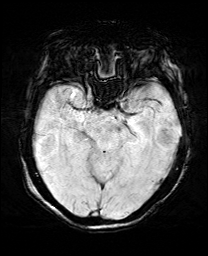
[im 36/60]
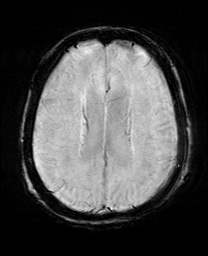
[im 48/60]
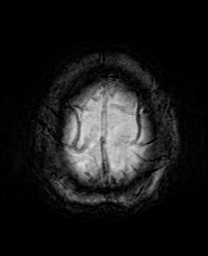
[im 60/60]
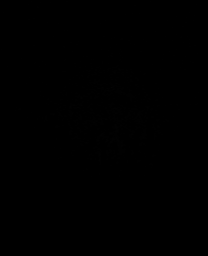

[Series 13: mip_images(sw) · axial · 24.0mm · 0.86mm/px · z∈[-55,+95]mm · 5 of 53 slices shown]
[im 1/53]
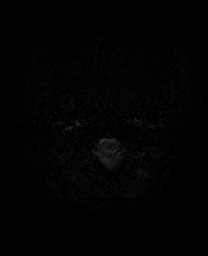
[im 14/53]
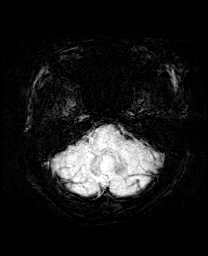
[im 27/53]
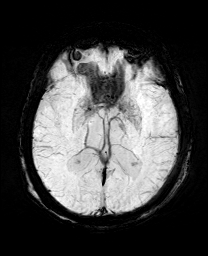
[im 40/53]
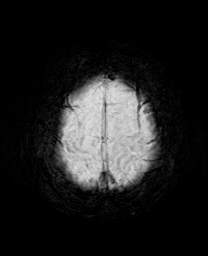
[im 53/53]
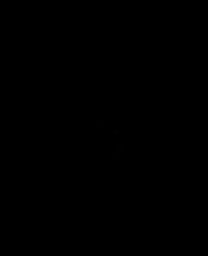

[Series 15: T2 · coronal · 5.0mm · 0.34mm/px · 3 of 29 slices shown (2 of 2)]
[im 1/29]
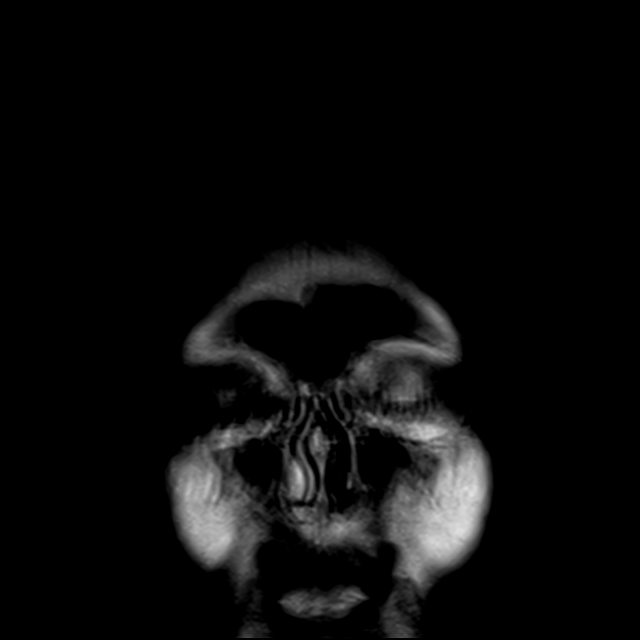
[im 15/29]
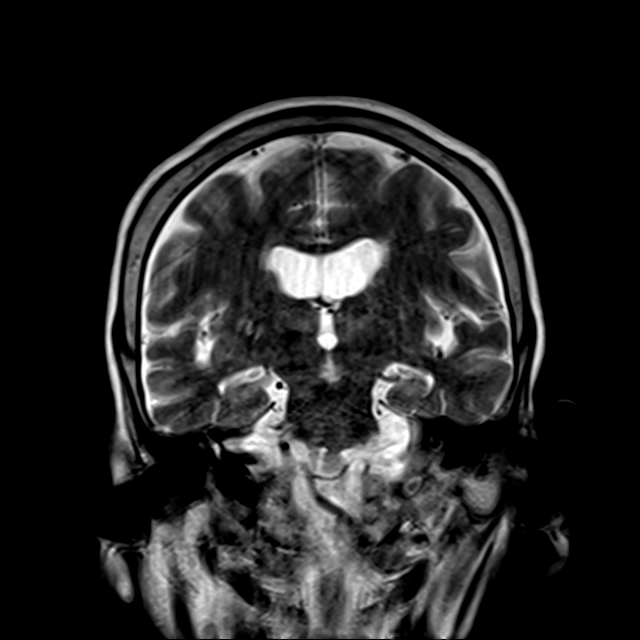
[im 29/29]
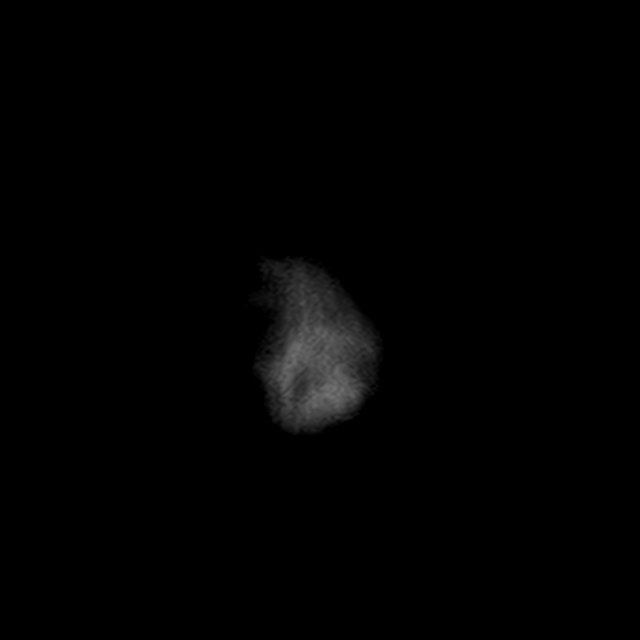

[42 of 48 positions shown; findings below may reference images not displayed]

FINDINGS: Brain: Diffusion imaging shows an acute small vessel stroke within
the periphery of the inferior cerebellum on the left and superior
cerebellum on the right. There is a 1 cm acute infarction in the
left lateral thalamus. Punctate acute infarction in the right
caudate/adjacent white matter tracks. No other acute infarction. No
evidence of swelling or hemorrhage. Chronic small-vessel ischemic
changes affect the pons. There are a few old small vessel cerebellar
infarctions. There chronic small-vessel ischemic changes of the
thalami, basal ganglia and hemispheric white matter. No large vessel
territory infarction. No mass lesion, hemorrhage, hydrocephalus or
extra-axial collection.

Vascular: Major vessels at the base of the brain show flow.

Skull and upper cervical spine: Negative

Sinuses/Orbits: Clear/normal

Other: None
IMPRESSION: 1 cm acute infarction in the left lateral thalamus. Subcentimeter
acute infarction in the posterior inferior cerebellum on the left
and in the superior cerebellum on the right. Punctate acute
infarction within the right caudate/adjacent white matter tracks.
Anterior and posterior circulation insults suggest embolic disease
from the heart or ascending aorta.

## 2019-08-11 ENCOUNTER — Other Ambulatory Visit: Payer: Self-pay

## 2019-09-14 ENCOUNTER — Encounter: Payer: Self-pay | Admitting: Hematology & Oncology

## 2019-09-14 ENCOUNTER — Inpatient Hospital Stay: Payer: Medicaid Other | Attending: Hematology & Oncology | Admitting: Hematology & Oncology

## 2019-09-14 ENCOUNTER — Other Ambulatory Visit: Payer: Self-pay

## 2019-09-14 ENCOUNTER — Inpatient Hospital Stay: Payer: Medicaid Other

## 2019-09-14 VITALS — BP 160/74 | HR 83 | Temp 98.7°F | Resp 16 | Ht 61.0 in | Wt 118.0 lb

## 2019-09-14 DIAGNOSIS — Z8673 Personal history of transient ischemic attack (TIA), and cerebral infarction without residual deficits: Secondary | ICD-10-CM | POA: Diagnosis not present

## 2019-09-14 DIAGNOSIS — I10 Essential (primary) hypertension: Secondary | ICD-10-CM

## 2019-09-14 DIAGNOSIS — Z79899 Other long term (current) drug therapy: Secondary | ICD-10-CM | POA: Insufficient documentation

## 2019-09-14 DIAGNOSIS — Z7982 Long term (current) use of aspirin: Secondary | ICD-10-CM | POA: Insufficient documentation

## 2019-09-14 DIAGNOSIS — R768 Other specified abnormal immunological findings in serum: Secondary | ICD-10-CM

## 2019-09-14 DIAGNOSIS — D696 Thrombocytopenia, unspecified: Secondary | ICD-10-CM

## 2019-09-14 DIAGNOSIS — E7800 Pure hypercholesterolemia, unspecified: Secondary | ICD-10-CM | POA: Diagnosis not present

## 2019-09-14 DIAGNOSIS — I252 Old myocardial infarction: Secondary | ICD-10-CM | POA: Diagnosis not present

## 2019-09-14 LAB — CBC WITH DIFFERENTIAL (CANCER CENTER ONLY)
Abs Immature Granulocytes: 0.01 10*3/uL (ref 0.00–0.07)
Basophils Absolute: 0 10*3/uL (ref 0.0–0.1)
Basophils Relative: 0 %
Eosinophils Absolute: 0.1 10*3/uL (ref 0.0–0.5)
Eosinophils Relative: 1 %
HCT: 33.6 % — ABNORMAL LOW (ref 36.0–46.0)
Hemoglobin: 11.4 g/dL — ABNORMAL LOW (ref 12.0–15.0)
Immature Granulocytes: 0 %
Lymphocytes Relative: 26 %
Lymphs Abs: 1.2 10*3/uL (ref 0.7–4.0)
MCH: 27.3 pg (ref 26.0–34.0)
MCHC: 33.9 g/dL (ref 30.0–36.0)
MCV: 80.6 fL (ref 80.0–100.0)
Monocytes Absolute: 0.5 10*3/uL (ref 0.1–1.0)
Monocytes Relative: 11 %
Neutro Abs: 2.9 10*3/uL (ref 1.7–7.7)
Neutrophils Relative %: 62 %
Platelet Count: 135 10*3/uL — ABNORMAL LOW (ref 150–400)
RBC: 4.17 MIL/uL (ref 3.87–5.11)
RDW: 15 % (ref 11.5–15.5)
WBC Count: 4.8 10*3/uL (ref 4.0–10.5)
nRBC: 0 % (ref 0.0–0.2)

## 2019-09-14 LAB — CMP (CANCER CENTER ONLY)
ALT: 42 U/L (ref 0–44)
AST: 77 U/L — ABNORMAL HIGH (ref 15–41)
Albumin: 3.4 g/dL — ABNORMAL LOW (ref 3.5–5.0)
Alkaline Phosphatase: 73 U/L (ref 38–126)
Anion gap: 7 (ref 5–15)
BUN: 20 mg/dL (ref 8–23)
CO2: 26 mmol/L (ref 22–32)
Calcium: 9.4 mg/dL (ref 8.9–10.3)
Chloride: 105 mmol/L (ref 98–111)
Creatinine: 0.88 mg/dL (ref 0.44–1.00)
GFR, Est AFR Am: >60 mL/min (ref >60)
GFR, Estimated: 60 mL/min — ABNORMAL LOW (ref >60)
Glucose, Bld: 94 mg/dL (ref 70–99)
Potassium: 3.7 mmol/L (ref 3.5–5.1)
Sodium: 138 mmol/L (ref 135–145)
Total Bilirubin: 0.9 mg/dL (ref 0.3–1.2)
Total Protein: 7.9 g/dL (ref 6.5–8.1)

## 2019-09-14 NOTE — Progress Notes (Signed)
Pt. Accompanied by [place],the interpreter.

## 2019-09-14 NOTE — Progress Notes (Signed)
Referral MD  Reason for Referral: Abnormal protein electrophoresis-polyclonal increase in immunoglobulins  Chief Complaint  Patient presents with  . New Patient (Initial Visit)  : Patient states that she feels tired.  HPI: Erin Patterson is a very charming 86 year old [place] woman. She does not speak [place]. Thankfully, the interpreter was very good.  She was referred to the [facility] because she had an abnormal protein electrophoresis. She is followed by Dr. Orma Render. He did a protein electrophoresis on her about a month ago. There is no monoclonal spike in her blood. She had an an elevated IgG of 3220 mg/dL. Her IgA was elevated at 465 mg/dL. She had both elevated kappa and light chains. However, the immunofixation did not show any monoclonal spike. This was all polyclonal.  I suspect that this is all inflammatory.  She does not smoke. She has been married 33 years. She has 9 children. 8 are still back in [place]. She last went to [place] a couple years ago.  She has had no weight loss. She is not a vegetarian. She has had no change in bowel or bladder habits. There is been no bony pain. She may have little bit of pain in the right knee.  She has had no cough. There is been no problems with the coronavirus.  She has had no rashes. There has been no problem with headaches.  Overall, her performance status is ECOG 1.   Past Medical History:  Diagnosis Date  . Hypercholesteremia   . Hypertension   . MI (myocardial infarction) ([place])   . Stroke (cerebrum) ([place])    years ago--episode of "bleeding from right ear and onset of weakness"   :  Past Surgical History:  Procedure Laterality Date  . TEE WITHOUT CARDIOVERSION N/A 07/30/2017   Procedure: TRANSESOPHAGEAL ECHOCARDIOGRAM (TEE);  Surgeon: Larey Dresser, MD;  Location: [facility] ENDOSCOPY;  Service: Cardiovascular;  Laterality: N/A;  :   Current Outpatient Medications:  .  aspirin 81 MG EC tablet, Take 1 tablet (81  mg total) by mouth daily., Disp: 100 tablet, Rfl: 0 .  atorvastatin (LIPITOR) 40 MG tablet, Take 1 tablet (40 mg total) by mouth daily at 6 PM., Disp: 30 tablet, Rfl: 0 .  lisinopril-hydrochlorothiazide (PRINZIDE,ZESTORETIC) 20-12.5 MG tablet, Take 1 tablet by mouth daily., Disp: , Rfl: :  :  No Known Allergies:  History reviewed. No pertinent family history.:  Social History   Socioeconomic History  . Marital status: Married    Spouse name: Not on file  . Number of children: Not on file  . Years of education: Not on file  . Highest education level: Not on file  Occupational History  . Not on file  Tobacco Use  . Smoking status: Never Smoker  . Smokeless tobacco: Never Used  Vaping Use  . Vaping Use: Never used  Substance and Sexual Activity  . Alcohol use: Never  . Drug use: Never  . Sexual activity: Not on file  Other Topics Concern  . Not on file  Social History Narrative  . Not on file   Social Determinants of Health   Financial Resource Strain:   . Difficulty of Paying Living Expenses:   Food Insecurity:   . Worried About Charity fundraiser in the Last Year:   . Arboriculturist in the Last Year:   Transportation Needs:   . Film/video editor (Medical):   Marland Kitchen Lack of Transportation (Non-Medical):   Physical Activity:   .  Days of Exercise per Week:   . Minutes of Exercise per Session:   Stress:   . Feeling of Stress :   Social Connections:   . Frequency of Communication with Friends and Family:   . Frequency of Social Gatherings with Friends and Family:   . Attends Religious Services:   . Active Member of Clubs or Organizations:   . Attends Archivist Meetings:   Marland Kitchen Marital Status:   Intimate Partner Violence:   . Fear of Current or Ex-Partner:   . Emotionally Abused:   Marland Kitchen Physically Abused:   . Sexually Abused:   :  Review of Systems  Constitutional: Positive for malaise/fatigue.  HENT: Negative.   Eyes: Negative.   Respiratory:  Negative.   Cardiovascular: Negative.   Gastrointestinal: Negative.   Genitourinary: Negative.   Musculoskeletal: Negative.   Skin: Negative.   Neurological: Negative.   Endo/Heme/Allergies: Negative.   Psychiatric/Behavioral: Negative.      Exam:  Physical Exam Vitals reviewed.  HENT:     Head: Normocephalic and atraumatic.  Eyes:     Pupils: Pupils are equal, round, and reactive to light.  Cardiovascular:     Rate and Rhythm: Normal rate and regular rhythm.     Heart sounds: Normal heart sounds.  Pulmonary:     Effort: Pulmonary effort is normal.     Breath sounds: Normal breath sounds.  Abdominal:     General: Bowel sounds are normal.     Palpations: Abdomen is soft.  Musculoskeletal:        General: No tenderness or deformity. Normal range of motion.     Cervical back: Normal range of motion.  Lymphadenopathy:     Cervical: No cervical adenopathy.  Skin:    General: Skin is warm and dry.     Findings: No erythema or rash.  Neurological:     Mental Status: She is alert and oriented to person, place, and time.  Psychiatric:        Behavior: Behavior normal.        Thought Content: Thought content normal.        Judgment: Judgment normal.     _0 @   Recent Labs    09/14/19 1047  WBC 4.8  HGB 11.4*  HCT 33.6*  PLT 135*   Recent Labs    09/14/19 1047  NA 138  K 3.7  CL 105  CO2 26  GLUCOSE 94  BUN 20  CREATININE 0.88  CALCIUM 9.4    Blood smear review: She has a normochromic and normocytic population of red blood cells. She has a couple target cells. There is very little anisocytosis and poikilocytosis. There is no rouleaux formation. I see no nucleated red blood cells. White blood cells appear normal in morphology and maturation. She has no hypersegmented polys. There is no immature myeloid or lymphoid cells. Platelets appear adequate in number and size. Platelets are well granulated.  Pathology: None    Assessment and Plan: Erin Patterson  is a nice 86 year old vitamins woman. She has a polyclonal increase in immunoglobulins. Whenever I see this, it is typically inflammatory. Rarely, will you have a polyclonal myeloma. I just find that hard to believe with her.  I do suspect that she probably has iron deficiency. I am not sure when she has had, or if she has had a colonoscopy.  Given that she is from [place], she probably has a hemoglobinopathy, specifically hemoglobin E.  Her blood smear, however, is not of  a hemoglobinopathy.  I do not see a need for a bone marrow biopsy.  Again a polyclonal increase in immunoglobulins is a typical indication of them for chronic inflammatory state.  She is very nice. The interpreter was very helpful.  I would like to see her back in about 3 months. We will recheck her iron studies. I told her to take some over-the-counter iron pills.  I

## 2019-09-15 LAB — PROTEIN ELECTROPHORESIS, SERUM, WITH REFLEX
A/G Ratio: 0.7 (ref 0.7–1.7)
Albumin ELP: 3.1 g/dL (ref 2.9–4.4)
Alpha-1-Globulin: 0.2 g/dL (ref 0.0–0.4)
Alpha-2-Globulin: 0.5 g/dL (ref 0.4–1.0)
Beta Globulin: 0.9 g/dL (ref 0.7–1.3)
Gamma Globulin: 2.7 g/dL — ABNORMAL HIGH (ref 0.4–1.8)
Globulin, Total: 4.2 g/dL — ABNORMAL HIGH (ref 2.2–3.9)
Total Protein ELP: 7.3 g/dL (ref 6.0–8.5)

## 2019-09-15 LAB — IGG, IGA, IGM
IgA: 438 mg/dL — ABNORMAL HIGH (ref 64–422)
IgG (Immunoglobin G), Serum: 2803 mg/dL — ABNORMAL HIGH (ref 586–1602)
IgM (Immunoglobulin M), Srm: 130 mg/dL (ref 26–217)

## 2019-09-15 LAB — KAPPA/LAMBDA LIGHT CHAINS
Kappa free light chain: 61.9 mg/L — ABNORMAL HIGH (ref 3.3–19.4)
Kappa, lambda light chain ratio: 1.12 (ref 0.26–1.65)
Lambda free light chains: 55.5 mg/L — ABNORMAL HIGH (ref 5.7–26.3)

## 2019-12-10 ENCOUNTER — Inpatient Hospital Stay: Payer: Medicaid Other | Attending: Hematology & Oncology

## 2019-12-10 ENCOUNTER — Inpatient Hospital Stay: Payer: Medicaid Other | Admitting: Hematology & Oncology

## 2020-06-21 ENCOUNTER — Inpatient Hospital Stay (HOSPITAL_COMMUNITY)
Admission: EM | Admit: 2020-06-21 | Discharge: 2020-06-27 | DRG: 291 | Disposition: A | Discharge status: Home / Self Care | Payer: Medicaid Other | Attending: Internal Medicine | Admitting: Internal Medicine

## 2020-06-21 ENCOUNTER — Emergency Department (HOSPITAL_COMMUNITY): Payer: Medicaid Other

## 2020-06-21 DIAGNOSIS — Z8673 Personal history of transient ischemic attack (TIA), and cerebral infarction without residual deficits: Secondary | ICD-10-CM

## 2020-06-21 DIAGNOSIS — R5383 Other fatigue: Secondary | ICD-10-CM | POA: Diagnosis not present

## 2020-06-21 DIAGNOSIS — I252 Old myocardial infarction: Secondary | ICD-10-CM

## 2020-06-21 DIAGNOSIS — R0602 Shortness of breath: Secondary | ICD-10-CM

## 2020-06-21 DIAGNOSIS — I509 Heart failure, unspecified: Secondary | ICD-10-CM

## 2020-06-21 DIAGNOSIS — N179 Acute kidney failure, unspecified: Secondary | ICD-10-CM | POA: Diagnosis present

## 2020-06-21 DIAGNOSIS — Z79899 Other long term (current) drug therapy: Secondary | ICD-10-CM | POA: Diagnosis not present

## 2020-06-21 DIAGNOSIS — I110 Hypertensive heart disease with heart failure: Principal | ICD-10-CM | POA: Diagnosis present

## 2020-06-21 DIAGNOSIS — I10 Essential (primary) hypertension: Secondary | ICD-10-CM | POA: Diagnosis present

## 2020-06-21 DIAGNOSIS — E871 Hypo-osmolality and hyponatremia: Secondary | ICD-10-CM | POA: Diagnosis present

## 2020-06-21 DIAGNOSIS — M7989 Other specified soft tissue disorders: Secondary | ICD-10-CM | POA: Diagnosis not present

## 2020-06-21 DIAGNOSIS — E785 Hyperlipidemia, unspecified: Secondary | ICD-10-CM | POA: Diagnosis present

## 2020-06-21 DIAGNOSIS — T502X5A Adverse effect of carbonic-anhydrase inhibitors, benzothiadiazides and other diuretics, initial encounter: Secondary | ICD-10-CM | POA: Diagnosis present

## 2020-06-21 DIAGNOSIS — I5031 Acute diastolic (congestive) heart failure: Secondary | ICD-10-CM | POA: Diagnosis present

## 2020-06-21 DIAGNOSIS — I679 Cerebrovascular disease, unspecified: Secondary | ICD-10-CM

## 2020-06-21 DIAGNOSIS — R601 Generalized edema: Secondary | ICD-10-CM | POA: Diagnosis not present

## 2020-06-21 DIAGNOSIS — E876 Hypokalemia: Secondary | ICD-10-CM | POA: Diagnosis present

## 2020-06-21 DIAGNOSIS — E7800 Pure hypercholesterolemia, unspecified: Secondary | ICD-10-CM | POA: Diagnosis present

## 2020-06-21 DIAGNOSIS — D509 Iron deficiency anemia, unspecified: Secondary | ICD-10-CM | POA: Diagnosis not present

## 2020-06-21 DIAGNOSIS — K219 Gastro-esophageal reflux disease without esophagitis: Secondary | ICD-10-CM | POA: Diagnosis present

## 2020-06-21 DIAGNOSIS — Z20822 Contact with and (suspected) exposure to covid-19: Secondary | ICD-10-CM | POA: Diagnosis present

## 2020-06-21 DIAGNOSIS — R590 Localized enlarged lymph nodes: Secondary | ICD-10-CM | POA: Diagnosis present

## 2020-06-21 LAB — HEPATIC FUNCTION PANEL
ALT: 63 U/L — ABNORMAL HIGH (ref 0–44)
AST: 128 U/L — ABNORMAL HIGH (ref 15–41)
Albumin: 2.3 g/dL — ABNORMAL LOW (ref 3.5–5.0)
Alkaline Phosphatase: 93 U/L (ref 38–126)
Bilirubin, Direct: 0.2 mg/dL (ref 0.0–0.2)
Indirect Bilirubin: 0.8 mg/dL (ref 0.3–0.9)
Total Bilirubin: 1 mg/dL (ref 0.3–1.2)
Total Protein: 6.7 g/dL (ref 6.5–8.1)

## 2020-06-21 LAB — CBC WITH DIFFERENTIAL/PLATELET
Abs Immature Granulocytes: 0.03 10*3/uL (ref 0.00–0.07)
Basophils Absolute: 0 10*3/uL (ref 0.0–0.1)
Basophils Relative: 1 %
Eosinophils Absolute: 0.1 10*3/uL (ref 0.0–0.5)
Eosinophils Relative: 1 %
HCT: 32 % — ABNORMAL LOW (ref 36.0–46.0)
Hemoglobin: 11.2 g/dL — ABNORMAL LOW (ref 12.0–15.0)
Immature Granulocytes: 1 %
Lymphocytes Relative: 34 %
Lymphs Abs: 2.2 10*3/uL (ref 0.7–4.0)
MCH: 27 pg (ref 26.0–34.0)
MCHC: 35 g/dL (ref 30.0–36.0)
MCV: 77.1 fL — ABNORMAL LOW (ref 80.0–100.0)
Monocytes Absolute: 1 10*3/uL (ref 0.1–1.0)
Monocytes Relative: 15 %
Neutro Abs: 3.2 10*3/uL (ref 1.7–7.7)
Neutrophils Relative %: 48 %
Platelets: 223 10*3/uL (ref 150–400)
RBC: 4.15 MIL/uL (ref 3.87–5.11)
RDW: 17.4 % — ABNORMAL HIGH (ref 11.5–15.5)
WBC: 6.6 10*3/uL (ref 4.0–10.5)
nRBC: 0.5 % — ABNORMAL HIGH (ref 0.0–0.2)

## 2020-06-21 LAB — BASIC METABOLIC PANEL
Anion gap: 5 (ref 5–15)
BUN: 11 mg/dL (ref 8–23)
CO2: 29 mmol/L (ref 22–32)
Calcium: 8.1 mg/dL — ABNORMAL LOW (ref 8.9–10.3)
Chloride: 99 mmol/L (ref 98–111)
Creatinine, Ser: 0.73 mg/dL (ref 0.44–1.00)
GFR, Estimated: >60 mL/min (ref >60)
Glucose, Bld: 96 mg/dL (ref 70–99)
Potassium: 4.2 mmol/L (ref 3.5–5.1)
Sodium: 133 mmol/L — ABNORMAL LOW (ref 135–145)

## 2020-06-21 LAB — BRAIN NATRIURETIC PEPTIDE: B Natriuretic Peptide: 550.4 pg/mL — ABNORMAL HIGH (ref 0.0–100.0)

## 2020-06-21 MED ORDER — SODIUM CHLORIDE 0.9% FLUSH
3.0000 mL | Freq: Two times a day (BID) | INTRAVENOUS | Status: DC
  Administered 2020-06-21 – 2020-06-27 (×10): 3 mL via INTRAVENOUS

## 2020-06-21 MED ORDER — ACETAMINOPHEN 325 MG PO TABS
650.0000 mg | ORAL_TABLET | ORAL | Status: DC | PRN
  Administered 2020-06-24: 650 mg via ORAL
  Filled 2020-06-21: qty 2

## 2020-06-21 MED ORDER — FUROSEMIDE 10 MG/ML IJ SOLN
40.0000 mg | INTRAMUSCULAR | Status: AC
  Administered 2020-06-21: 40 mg via INTRAVENOUS
  Filled 2020-06-21: qty 4

## 2020-06-21 MED ORDER — SODIUM CHLORIDE 0.9 % IV SOLN: 250.0000 mL | INTRAVENOUS | Status: DC | PRN

## 2020-06-21 MED ORDER — ONDANSETRON HCL 4 MG/2ML IJ SOLN: 4.0000 mg | Freq: Four times a day (QID) | INTRAMUSCULAR | Status: DC | PRN

## 2020-06-21 MED ORDER — LISINOPRIL 20 MG PO TABS
20.0000 mg | ORAL_TABLET | Freq: Every day | ORAL | Status: DC
  Administered 2020-06-22 – 2020-06-27 (×6): 20 mg via ORAL
  Filled 2020-06-21 (×6): qty 1

## 2020-06-21 MED ORDER — LISINOPRIL-HYDROCHLOROTHIAZIDE 20-12.5 MG PO TABS: 1.0000 | ORAL_TABLET | Freq: Every day | ORAL | Status: DC

## 2020-06-21 MED ORDER — CARVEDILOL 3.125 MG PO TABS
3.1250 mg | ORAL_TABLET | Freq: Two times a day (BID) | ORAL | Status: DC
  Administered 2020-06-22 – 2020-06-27 (×11): 3.125 mg via ORAL
  Filled 2020-06-21 (×11): qty 1

## 2020-06-21 MED ORDER — ATORVASTATIN CALCIUM 40 MG PO TABS
40.0000 mg | ORAL_TABLET | Freq: Every day | ORAL | Status: DC
  Administered 2020-06-22 – 2020-06-26 (×5): 40 mg via ORAL
  Filled 2020-06-21 (×5): qty 1

## 2020-06-21 MED ORDER — SODIUM CHLORIDE 0.9% FLUSH: 3.0000 mL | INTRAVENOUS | Status: DC | PRN

## 2020-06-21 MED ORDER — FUROSEMIDE 10 MG/ML IJ SOLN
40.0000 mg | Freq: Every day | INTRAMUSCULAR | Status: DC
  Administered 2020-06-22 – 2020-06-24 (×3): 40 mg via INTRAVENOUS
  Filled 2020-06-21 (×3): qty 4

## 2020-06-21 MED ORDER — ENOXAPARIN SODIUM 40 MG/0.4ML ~~LOC~~ SOLN
40.0000 mg | SUBCUTANEOUS | Status: DC
  Administered 2020-06-21 – 2020-06-26 (×6): 40 mg via SUBCUTANEOUS
  Filled 2020-06-21 (×6): qty 0.4

## 2020-06-21 MED ORDER — HYDROCHLOROTHIAZIDE 12.5 MG PO CAPS
12.5000 mg | ORAL_CAPSULE | Freq: Every day | ORAL | Status: DC
  Administered 2020-06-22 – 2020-06-24 (×3): 12.5 mg via ORAL
  Filled 2020-06-21 (×3): qty 1

## 2020-06-21 MED ORDER — ASPIRIN EC 81 MG PO TBEC
81.0000 mg | DELAYED_RELEASE_TABLET | Freq: Every day | ORAL | Status: DC
  Administered 2020-06-21 – 2020-06-27 (×7): 81 mg via ORAL
  Filled 2020-06-21 (×7): qty 1

## 2020-06-21 NOTE — H&P (Addendum)
History and Physical    Erin Patterson YPP:[id number] DOB: Jun 06, 1932 DOA: 06/21/2020  PCP: Jackie Plum, MD  Patient coming from: Home  I have personally briefly reviewed patient's old medical records in [facility]  Chief Complaint: Leg swelling  HPI: Erin Patterson is a 87 y.o. female with medical history significant of HTN, HLD, prior stroke and MI.  Pt presents to ED with c/o increased BLE edema and fatigue.  Symptoms onset about 1 month ago.  Worsening, persistent, now severe.  Associated SOB that is worse when she lays down flat.  No CP.  No cough, no fevers nor chills.   ED Course: Pt with BNP 500, mild CHF findings on CXR.   Review of Systems: As per HPI, otherwise all review of systems negative.  Past Medical History:  Diagnosis Date  . Hypercholesteremia   . Hypertension   . MI (myocardial infarction) ([facility])   . Stroke (cerebrum) ([facility])    years ago--episode of "bleeding from right ear and onset of weakness"     Past Surgical History:  Procedure Laterality Date  . TEE WITHOUT CARDIOVERSION N/A 07/30/2017   Procedure: TRANSESOPHAGEAL ECHOCARDIOGRAM (TEE);  Surgeon: Laurey Morale, MD;  Location: [facility] ENDOSCOPY;  Service: Cardiovascular;  Laterality: N/A;     reports that she has never smoked. She has never used smokeless tobacco. She reports that she does not drink alcohol and does not use drugs.  No Known Allergies  Family History  Problem Relation Age of Onset  . Hypertension Neg Hx   . Stroke Neg Hx      Prior to Admission medications   Medication Sig Start Date End Date Taking? Authorizing Provider  lisinopril-hydrochlorothiazide (PRINZIDE,ZESTORETIC) 20-12.5 MG tablet Take 1 tablet by mouth daily. 03/31/18  Yes [provider]  aspirin 81 MG EC tablet Take 1 tablet (81 mg total) by mouth daily. Patient not taking: Reported on 06/21/2020 08/08/17   Love, Evlyn Kanner, PA-C  atorvastatin (LIPITOR) 40 MG tablet Take 1 tablet (40 mg total) by  mouth daily at 6 PM. 08/08/17   Love, Evlyn Kanner, PA-C  furosemide (LASIX) 40 MG tablet Take 40 mg by mouth 2 (two) times daily. 01/25/20   [provider]  potassium chloride SA (KLOR-CON) 20 MEQ tablet Take 20 mEq by mouth daily. 05/12/20   [provider]    Physical Exam: Vitals:   06/21/20 1608 06/21/20 1859 06/21/20 2056  BP: (!) 190/91 (!) 176/92 (!) 178/88  Pulse: 92 93 96  Resp: 16 16 16   Temp: 98.4 F (36.9 C)    TempSrc: Oral    SpO2: 97% 100% 99%    Constitutional: NAD, calm, comfortable Eyes: PERRL, lids and conjunctivae normal ENMT: Mucous membranes are moist. Posterior pharynx clear of any exudate or lesions.Normal dentition.  Neck: normal, supple, no masses, no thyromegaly Respiratory: clear to auscultation bilaterally, no wheezing, no crackles. Normal respiratory effort. No accessory muscle use.  Cardiovascular: Regular rate and rhythm, no murmurs / rubs / gallops. Anasarca. 2+ pedal pulses. No carotid bruits.  Abdomen: no tenderness, no masses palpated. No hepatosplenomegaly. Bowel sounds positive.  Musculoskeletal: no clubbing / cyanosis. No joint deformity upper and lower extremities. Good ROM, no contractures. Normal muscle tone.  Skin: no rashes, lesions, ulcers. No induration Neurologic: CN 2-12 grossly intact. Sensation intact, DTR normal. Strength 5/5 in all 4.  Psychiatric: Normal judgment and insight. Alert and oriented x 3. Normal mood.    Labs on Admission: I have personally reviewed  following labs and imaging studies  CBC: Recent Labs  Lab 06/21/20 1616  WBC 6.6  NEUTROABS 3.2  HGB 11.2*  HCT 32.0*  MCV 77.1*  PLT 223   Basic Metabolic Panel: Recent Labs  Lab 06/21/20 1616  NA 133*  K 4.2  CL 99  CO2 29  GLUCOSE 96  BUN 11  CREATININE 0.73  CALCIUM 8.1*   GFR: CrCl cannot be calculated (Unknown ideal weight.). Liver Function Tests: No results for input(s): AST, ALT, ALKPHOS, BILITOT, PROT, ALBUMIN in the last 168  hours. No results for input(s): LIPASE, AMYLASE in the last 168 hours. No results for input(s): AMMONIA in the last 168 hours. Coagulation Profile: No results for input(s): INR, PROTIME in the last 168 hours. Cardiac Enzymes: No results for input(s): CKTOTAL, CKMB, CKMBINDEX, TROPONINI in the last 168 hours. BNP (last 3 results) No results for input(s): PROBNP in the last 8760 hours. HbA1C: No results for input(s): HGBA1C in the last 72 hours. CBG: No results for input(s): GLUCAP in the last 168 hours. Lipid Profile: No results for input(s): CHOL, HDL, LDLCALC, TRIG, CHOLHDL, LDLDIRECT in the last 72 hours. Thyroid Function Tests: No results for input(s): TSH, T4TOTAL, FREET4, T3FREE, THYROIDAB in the last 72 hours. Anemia Panel: No results for input(s): VITAMINB12, FOLATE, FERRITIN, TIBC, IRON, RETICCTPCT in the last 72 hours. Urine analysis:    Component Value Date/Time   COLORURINE COLORLESS (A) 07/26/2017 0950   APPEARANCEUR CLEAR 07/26/2017 0950   LABSPEC 1.004 (L) 07/26/2017 0950   PHURINE 8.0 07/26/2017 0950   GLUCOSEU 50 (A) 07/26/2017 0950   HGBUR SMALL (A) 07/26/2017 0950   BILIRUBINUR NEGATIVE 07/26/2017 0950   KETONESUR NEGATIVE 07/26/2017 0950   PROTEINUR 30 (A) 07/26/2017 0950   UROBILINOGEN 1.0 12/10/2014 1225   NITRITE NEGATIVE 07/26/2017 0950   LEUKOCYTESUR TRACE (A) 07/26/2017 0950    Radiological Exams on Admission: DG Chest 2 View  Result Date: 06/21/2020 CLINICAL DATA:  Edema.  Bilateral leg swelling. EXAM: CHEST - 2 VIEW COMPARISON:  07/26/2017 FINDINGS: Mild cardiomegaly. Moderate aortic atherosclerosis. Mitral annulus calcifications are seen. There are small bilateral pleural effusions and adjacent atelectasis. Fluid tracks in the right minor fissure. Vascular congestion with mild peribronchial thickening. No confluent consolidation. No pneumothorax. No acute osseous abnormalities are seen. IMPRESSION: Mild CHF. Cardiomegaly, vascular congestion, and  small pleural effusions. Electronically Signed   By: Narda Rutherford M.D.   On: 06/21/2020 16:55    EKG: Independently reviewed. LVH findings seen on 2019 EKGs are no longer as apparent.  Pt now has Q waves in lead III that wernt present previously.  Assessment/Plan Principal Problem:   New onset of congestive heart failure (HCC) Active Problems:   H/O: stroke   Essential hypertension    1. New onset CHF - 1. CHF pathway 2. Lasix 40mg  IV now then daily 3. Tele monitor 4. 2d echo 5. Strict intake and output 6. Daily BMP 2. HTN - 1. Cont home zestoretic 2. Add low dose coreg 3. H/o Stroke - 1. Resume ASA 81 2. Resume Statin  DVT prophylaxis: Lovenox Code Status: Full Family Communication: Husband at bedside Disposition Plan: Home after admit Consults called: None Admission status: Admit to inpatient  Severity of Illness: The appropriate patient status for this patient is INPATIENT. Inpatient status is judged to be reasonable and necessary in order to provide the required intensity of service to ensure the patient's safety. The patient's presenting symptoms, physical exam findings, and initial radiographic and laboratory data in  the context of their chronic comorbidities is felt to place them at high risk for further clinical deterioration. Furthermore, it is not anticipated that the patient will be medically stable for discharge from the hospital within 2 midnights of admission. The following factors support the patient status of inpatient.   IP status for new onset of CHF.   * I certify that at the point of admission it is my clinical judgment that the patient will require inpatient hospital care spanning beyond 2 midnights from the point of admission due to high intensity of service, high risk for further deterioration and high frequency of surveillance required.*    Arling Cerone M. DO [facility]  How to contact the [facility] Attending or Consulting provider 7A -  7P or covering provider during after hours 7P -7A, for this patient?  1. Check the care team in [facility] and look for a) attending/consulting [facility] provider listed and b) the [facility] team listed 2. Log into [email or web address]  Amion Physician Scheduling and messaging for groups and whole hospitals  On call and physician scheduling software for group practices, residents, hospitalists and other medical providers for call, clinic, rotation and shift schedules. On[employer] is a hospital-wide system for scheduling doctors and paging doctors on call. EasyPlot is for scientific plotting and data analysis.  [email or web address]  and use [facility]'s universal password to access. If you do not have the password, please contact the hospital operator.  3. Locate the [facility] provider you are looking for under [facility] and page to a number that you can be directly reached. 4. If you still have difficulty reaching the provider, please page the [facility] (Director on Call) for the Hospitalists listed on amion for assistance.  06/21/2020, 9:47 PM

## 2020-06-21 NOTE — ED Provider Notes (Signed)
[facility] EMERGENCY DEPARTMENT Provider Note   CSN: [id number] Arrival date & time: 06/21/20  1520     History No chief complaint on file.   Erin Patterson is a 87 y.o. female.  HPI   A translator was used for the entirety of my patient counter.  This is an 87 year old female presenting in the care of her husband from home with significant increase in her edema.  The patient is followed by local family doctor, Dr. Julio Sicks.  She has recently over the last month developed increasing fatigue, she does not leave the house, she has had increasing swelling which started in her feet and is moved its way of her legs.  It is now up onto her abdomen.  She denies fevers or chills, has no coughing or fevers, no diarrhea, no nausea, she has had some shortness of breath that is worse when she lays down.  I have reviewed the medical record and it appears that she had an echocardiogram approximately 3 years ago showing an ejection fraction of 55 to 60%, no other signs of congestive heart failure.  She does take antihypertensives including lisinopril hydrochlorothiazide.  The patient denies any active chest pain, symptoms are persistent and gradually worsening and have now become severe.  Past Medical History:  Diagnosis Date  . Hypercholesteremia   . Hypertension   . MI (myocardial infarction) (HCC)   . Stroke (cerebrum) (HCC)    years ago--episode of "bleeding from right ear and onset of weakness"     Patient Active Problem List   Diagnosis Date Noted  . New onset of congestive heart failure (HCC) 06/21/2020  . Abnormal LFTs 08/11/2017  . Embolic stroke (HCC) 07/30/2017  . Thrombocytopenia (HCC)   . Essential hypertension   . Sleep disturbance   . Dyslipidemia   . H/O: stroke   . Intracranial vascular stenosis   . Ischemic cerebrovascular accident (CVA) (HCC)   . Benign essential HTN   . AKI (acute kidney injury) (HCC)   . Cerebrovascular accident (CVA) due to  embolism of cerebral artery (HCC)   . Hypophosphatemia   . Elevated brain natriuretic peptide (BNP) level   . Cardiomegaly   . Hypertensive urgency   . Leg cramp   . Dizziness 07/26/2017  . Weakness   . Hypokalemia   . Headache 12/10/2014  . Fever 12/10/2014  . Vision loss 12/10/2014  . HLD (hyperlipidemia) 12/10/2014  . MI (myocardial infarction) (HCC) 12/10/2014  . CAD (coronary artery disease) 12/10/2014    Past Surgical History:  Procedure Laterality Date  . TEE WITHOUT CARDIOVERSION N/A 07/30/2017   Procedure: TRANSESOPHAGEAL ECHOCARDIOGRAM (TEE);  Surgeon: Laurey Morale, MD;  Location: [facility] ENDOSCOPY;  Service: Cardiovascular;  Laterality: N/A;     OB History   No obstetric history on file.     No family history on file.  Social History   Tobacco Use  . Smoking status: Never Smoker  . Smokeless tobacco: Never Used  Vaping Use  . Vaping Use: Never used  Substance Use Topics  . Alcohol use: Never  . Drug use: Never    Home Medications Prior to Admission medications   Medication Sig Start Date End Date Taking? Authorizing Provider  aspirin 81 MG EC tablet Take 1 tablet (81 mg total) by mouth daily. 08/08/17   Love, Evlyn Kanner, PA-C  atorvastatin (LIPITOR) 40 MG tablet Take 1 tablet (40 mg total) by mouth daily at 6 PM. 08/08/17   Love, Evlyn Kanner,  PA-C  lisinopril-hydrochlorothiazide (PRINZIDE,ZESTORETIC) 20-12.5 MG tablet Take 1 tablet by mouth daily. 03/31/18   [provider]    Allergies    Patient has no known allergies.  Review of Systems   Review of Systems  All other systems reviewed and are negative.   Physical Exam Updated Vital Signs BP (!) 178/88 (BP Location: Right Arm)   Pulse 96   Temp 98.4 F (36.9 C) (Oral)   Resp 16   SpO2 99%   Physical Exam Vitals and nursing note reviewed.  Constitutional:      General: She is not in acute distress.    Appearance: She is well-developed.  HENT:     Head: Normocephalic and atraumatic.      Mouth/Throat:     Pharynx: No oropharyngeal exudate.  Eyes:     General: No scleral icterus.       Right eye: No discharge.        Left eye: No discharge.     Conjunctiva/sclera: Conjunctivae normal.     Pupils: Pupils are equal, round, and reactive to light.  Neck:     Thyroid: No thyromegaly.     Vascular: No JVD.  Cardiovascular:     Rate and Rhythm: Normal rate and regular rhythm.     Heart sounds: Normal heart sounds. No murmur heard. No friction rub. No gallop.   Pulmonary:     Effort: Pulmonary effort is normal. No respiratory distress.     Breath sounds: Rales present. No wheezing.  Abdominal:     General: Bowel sounds are normal. There is no distension.     Palpations: Abdomen is soft. There is no mass.     Tenderness: There is no abdominal tenderness.  Musculoskeletal:        General: Swelling present. No tenderness. Normal range of motion.     Cervical back: Normal range of motion and neck supple.     Right lower leg: Edema present.     Left lower leg: Edema present.  Lymphadenopathy:     Cervical: No cervical adenopathy.  Skin:    General: Skin is warm and dry.     Findings: No erythema or rash.  Neurological:     Mental Status: She is alert.     Coordination: Coordination normal.  Psychiatric:        Behavior: Behavior normal.     ED Results / Procedures / Treatments   Labs (all labs ordered are listed, but only abnormal results are displayed) Labs Reviewed  BASIC METABOLIC PANEL - Abnormal; Notable for the following components:      Result Value   Sodium 133 (*)    Calcium 8.1 (*)    All other components within normal limits  CBC WITH DIFFERENTIAL/PLATELET - Abnormal; Notable for the following components:   Hemoglobin 11.2 (*)    HCT 32.0 (*)    MCV 77.1 (*)    RDW 17.4 (*)    nRBC 0.5 (*)    All other components within normal limits  BRAIN NATRIURETIC PEPTIDE - Abnormal; Notable for the following components:   B Natriuretic Peptide 550.4 (*)     All other components within normal limits  HEPATIC FUNCTION PANEL  BASIC METABOLIC PANEL    EKG EKG Interpretation  Date/Time:  Wednesday June 21 2020 16:12:11 EDT Ventricular Rate:  90 PR Interval:  184 QRS Duration: 82 QT Interval:  364 QTC Calculation: 445 R Axis:   -19 Text Interpretation: Sinus rhythm with occasional Premature ventricular  complexes Possible Anterior infarct , age undetermined Abnormal ECG Confirmed by Eber Hong ([id number]) on 06/21/2020 9:11:12 PM   Radiology DG Chest 2 View  Result Date: 06/21/2020 CLINICAL DATA:  Edema.  Bilateral leg swelling. EXAM: CHEST - 2 VIEW COMPARISON:  07/26/2017 FINDINGS: Mild cardiomegaly. Moderate aortic atherosclerosis. Mitral annulus calcifications are seen. There are small bilateral pleural effusions and adjacent atelectasis. Fluid tracks in the right minor fissure. Vascular congestion with mild peribronchial thickening. No confluent consolidation. No pneumothorax. No acute osseous abnormalities are seen. IMPRESSION: Mild CHF. Cardiomegaly, vascular congestion, and small pleural effusions. Electronically Signed   By: Narda Rutherford M.D.   On: 06/21/2020 16:55    Procedures Procedures   Medications Ordered in ED Medications  furosemide (LASIX) injection 40 mg (has no administration in time range)  sodium chloride flush (NS) 0.9 % injection 3 mL (has no administration in time range)  sodium chloride flush (NS) 0.9 % injection 3 mL (has no administration in time range)  0.9 %  sodium chloride infusion (has no administration in time range)  acetaminophen (TYLENOL) tablet 650 mg (has no administration in time range)  ondansetron (ZOFRAN) injection 4 mg (has no administration in time range)  enoxaparin (LOVENOX) injection 40 mg (has no administration in time range)  furosemide (LASIX) injection 40 mg (has no administration in time range)  carvedilol (COREG) tablet 3.125 mg (has no administration in time range)    ED Course   I have reviewed the triage vital signs and the nursing notes.  Pertinent labs & imaging results that were available during my care of the patient were reviewed by me and considered in my medical decision making (see chart for details).    MDM Rules/Calculators/A&P                          This patient has multiple abnormal findings including some rales, she does not appear to be in respiratory distress but has edema and anasarca that tracks all the way up to her lower thoracic area posteriorly and anteriorly onto her abdominal wall.  Her woody edema is consistent with severe edema likely from some element of congestive heart failure.  She may also have low albumin, added a metabolic panel hepatic function, other labs are rather unremarkable.  She will need to be admitted for formal diuresis and echocardiogram.  The patient is agreeable to the plan.  I reviewed the EKG which does not show any signs of ischemia or significant arrhythmia.  Patient will be admitted by Dr. Julian Reil, labs are consistent with congestive heart failure, thankfully no significant electrolyte abnormalities, mild signs of pulmonary edema on the x-ray, no hypoxia, patient agreeable to the plan  Final Clinical Impression(s) / ED Diagnoses Final diagnoses:  Acute congestive heart failure, unspecified heart failure type (HCC)  Anasarca      Eber Hong, MD 06/21/20 2130

## 2020-06-21 NOTE — ED Triage Notes (Signed)
Pt here from home with c/o bil leg swelling and redness and states that she feels weak

## 2020-06-21 NOTE — ED Notes (Signed)
Patient walked to bathroom with one assist. Steady gait.

## 2020-06-21 NOTE — ED Triage Notes (Signed)
Emergency Medicine Provider Triage Evaluation Note  Erin Patterson , a 87 y.o. female  was evaluated in triage.  Pt complains of swelling of legs to abdomen x 3 days. Interpreter used for history and exam. Review of Systems  Positive: edema Negative: CP, SHOB, abdominal pain  Physical Exam  There were no vitals taken for this visit. Gen:   Awake, no distress   HEENT:  Atraumatic  Resp:  Normal effort  Cardiac:  Normal rate  Abd:   Nondistended, nontender  MSK:   Moves extremities without difficulty  Neuro:  Speech clear  Pitting edema to mid abdomen, legs are warm to the touch  Medical Decision Making  Medically screening exam initiated at 4:07 PM.  Appropriate orders placed.  Erin Patterson was informed that the remainder of the evaluation will be completed by another provider, this initial triage assessment does not replace that evaluation, and the importance of remaining in the ED until their evaluation is complete.  Clinical Impression     Jeannie Fend, PA-C 06/21/20 1615

## 2020-06-22 ENCOUNTER — Encounter (HOSPITAL_COMMUNITY): Payer: Self-pay | Admitting: Internal Medicine

## 2020-06-22 ENCOUNTER — Inpatient Hospital Stay (HOSPITAL_COMMUNITY): Payer: Medicaid Other

## 2020-06-22 DIAGNOSIS — I5031 Acute diastolic (congestive) heart failure: Secondary | ICD-10-CM | POA: Diagnosis not present

## 2020-06-22 DIAGNOSIS — R601 Generalized edema: Secondary | ICD-10-CM | POA: Insufficient documentation

## 2020-06-22 LAB — BASIC METABOLIC PANEL
Anion gap: 6 (ref 5–15)
BUN: 8 mg/dL (ref 8–23)
CO2: 27 mmol/L (ref 22–32)
Calcium: 7.8 mg/dL — ABNORMAL LOW (ref 8.9–10.3)
Chloride: 99 mmol/L (ref 98–111)
Creatinine, Ser: 0.69 mg/dL (ref 0.44–1.00)
GFR, Estimated: >60 mL/min (ref >60)
Glucose, Bld: 91 mg/dL (ref 70–99)
Potassium: 3.5 mmol/L (ref 3.5–5.1)
Sodium: 132 mmol/L — ABNORMAL LOW (ref 135–145)

## 2020-06-22 LAB — ECHOCARDIOGRAM COMPLETE
AR max vel: 1.91 cm2
AV Area VTI: 1.91 cm2
AV Area mean vel: 1.91 cm2
AV Mean grad: 8 mmHg
AV Peak grad: 13.8 mmHg
Ao pk vel: 1.86 m/s
Area-P 1/2: 3.21 cm2
Calc EF: 67.5 %
P 1/2 time: 488 msec
S' Lateral: 2.2 cm
Single Plane A2C EF: 66.6 %
Single Plane A4C EF: 67.8 %
Weight: 2292.78 oz

## 2020-06-22 LAB — SARS CORONAVIRUS 2 (TAT 6-24 HRS): SARS Coronavirus 2: NEGATIVE

## 2020-06-22 NOTE — ED Notes (Signed)
Walked patient to the bathroom patient did well patient is now back in bed on the monitor and daughter at bedside and call bell in reach

## 2020-06-22 NOTE — Progress Notes (Signed)
PROGRESS NOTE    Erin Patterson  INO:[id number] DOB: 1932-11-14 DOA: 06/21/2020 PCP: Jackie Plum, MD   Brief Narrative:  On 06/21/2020 by Dr. Lyda Perone Erin Patterson is a 87 y.o. female with medical history significant of HTN, HLD, prior stroke and MI.  Pt presents to ED with c/o increased BLE edema and fatigue.  Symptoms onset about 1 month ago.  Worsening, persistent, now severe.  Associated SOB that is worse when she lays down flat.  No CP.  No cough, no fevers nor chills.  Interim history Patient presented with leg swelling and fatigue.  Found to have elevated BNP and mild CHF findings on x-ray.  Patient admitted with suspected new onset CHF.  Pending work-up.  On IV Lasix. Assessment & Plan   Lower extremity swelling, suspect new onset CHF -Patient with no history of CHF -Last echocardiogram/TEE done 07/30/2017 which shows an EF of 55 to 60%, diffuse hypokinesis -Patient presented with lower extremity swelling as well as fatigue. -BNP 550.4 -Chest x-ray showed mild CHF, vascular congestion and small pleural effusions -Continue IV Lasix -Pending echocardiogram -Monitor intake and output, daily weights  Essential hypertension -Continue Coreg, lisinopril, HCTZ -Continue IV Lasix  History of CVA -Continue statin, aspirin  Mild hyponatremia -Likely secondary to hypervolemia versus diuretics -Continue to monitor BMP  DVT Prophylaxis Lovenox  Code Status: Full  Family Communication: Husband at bedside  Disposition Plan:  Status is: Inpatient  Remains inpatient appropriate because:IV treatments appropriate due to intensity of illness or inability to take PO   Dispo: The patient is from: Home              Anticipated d/c is to: Home              Patient currently is not medically stable to d/c.   Difficult to place patient No   Consultants None  Procedures  None  Antibiotics   Anti-infectives (From admission, onward)   None      Subjective:    [name] seen and examined today.  Translator used.  Patient continues to complain of lower leg swelling as well as fatigue.  Has some shortness of breath with lying flat.  Has some chest pain in the mid sternal area, however cannot characterize her pain.  Denies any dizziness, abdominal pain, nausea or vomiting, diarrhea or constipation.    Objective:   Vitals:   06/22/20 0615 06/22/20 0630 06/22/20 0645 06/22/20 0742  BP: 123/70 (!) 115/58 128/64 124/66  Pulse: 84 82 83 86  Resp: 11 12 10 16   Temp:    98.9 F (37.2 C)  TempSrc:    Oral  SpO2: 98% 97% 99% 98%    Intake/Output Summary (Last 24 hours) at 06/22/2020 0845 Last data filed at 06/22/2020 0216 Gross per 24 hour  Intake --  Output 2000 ml  Net -2000 ml   There were no vitals filed for this visit.  Exam  General: Well developed, well nourished, elderly, NAD  HEENT: NCAT,, mucous membranes moist.   Cardiovascular: S1 S2 auscultated, RRR, soft SEM  Respiratory: Clear to auscultation bilaterally with e, no wheezing or rhonchi  Abdomen: Soft, nontender, nondistended, + bowel sounds  Extremities: warm dry without cyanosis clubbing. +++LE Edema B/L  Neuro: AAOx3, nonfocal  Psych: appropriate mood and affect, pleasant   Data Reviewed: I have personally reviewed following labs and imaging studies  CBC: Recent Labs  Lab 06/21/20 1616  WBC 6.6  NEUTROABS 3.2  HGB 11.2*  HCT 32.0*  MCV 77.1*  PLT 223   Basic Metabolic Panel: Recent Labs  Lab 06/21/20 1616 06/22/20 0135  NA 133* 132*  K 4.2 3.5  CL 99 99  CO2 29 27  GLUCOSE 96 91  BUN 11 8  CREATININE 0.73 0.69  CALCIUM 8.1* 7.8*   GFR: CrCl cannot be calculated (Unknown ideal weight.). Liver Function Tests: Recent Labs  Lab 06/21/20 1616  AST 128*  ALT 63*  ALKPHOS 93  BILITOT 1.0  PROT 6.7  ALBUMIN 2.3*   No results for input(s): LIPASE, AMYLASE in the last 168 hours. No results for input(s): AMMONIA in the last 168  hours. Coagulation Profile: No results for input(s): INR, PROTIME in the last 168 hours. Cardiac Enzymes: No results for input(s): CKTOTAL, CKMB, CKMBINDEX, TROPONINI in the last 168 hours. BNP (last 3 results) No results for input(s): PROBNP in the last 8760 hours. HbA1C: No results for input(s): HGBA1C in the last 72 hours. CBG: No results for input(s): GLUCAP in the last 168 hours. Lipid Profile: No results for input(s): CHOL, HDL, LDLCALC, TRIG, CHOLHDL, LDLDIRECT in the last 72 hours. Thyroid Function Tests: No results for input(s): TSH, T4TOTAL, FREET4, T3FREE, THYROIDAB in the last 72 hours. Anemia Panel: No results for input(s): VITAMINB12, FOLATE, FERRITIN, TIBC, IRON, RETICCTPCT in the last 72 hours. Urine analysis:    Component Value Date/Time   COLORURINE COLORLESS (A) 07/26/2017 0950   APPEARANCEUR CLEAR 07/26/2017 0950   LABSPEC 1.004 (L) 07/26/2017 0950   PHURINE 8.0 07/26/2017 0950   GLUCOSEU 50 (A) 07/26/2017 0950   HGBUR SMALL (A) 07/26/2017 0950   BILIRUBINUR NEGATIVE 07/26/2017 0950   KETONESUR NEGATIVE 07/26/2017 0950   PROTEINUR 30 (A) 07/26/2017 0950   UROBILINOGEN 1.0 12/10/2014 1225   NITRITE NEGATIVE 07/26/2017 0950   LEUKOCYTESUR TRACE (A) 07/26/2017 0950   Sepsis Labs: @LABRCNTIP (procalcitonin:4,lacticidven:4)  ) Recent Results (from the past 240 hour(s))  SARS CORONAVIRUS 2 (TAT 6-24 HRS) Nasopharyngeal Nasopharyngeal Swab     Status: None   Collection Time: 06/21/20 11:24 PM   Specimen: Nasopharyngeal Swab  Result Value Ref Range Status   SARS Coronavirus 2 NEGATIVE NEGATIVE Final    Comment: (NOTE) SARS-CoV-2 target nucleic acids are NOT DETECTED.  The SARS-CoV-2 RNA is generally detectable in upper and lower respiratory specimens during the acute phase of infection. Negative results do not preclude SARS-CoV-2 infection, do not rule out co-infections with other pathogens, and should not be used as the sole basis for treatment or other  patient management decisions. Negative results must be combined with clinical observations, patient history, and epidemiological information. The expected result is Negative.  Fact Sheet for Patients: 06/23/20  Fact Sheet for Healthcare Providers: HairSlick.no  This test is not yet approved or cleared by the quierodirigir.com FDA and  has been authorized for detection and/or diagnosis of SARS-CoV-2 by FDA under an Emergency Use Authorization (EUA). This EUA will remain  in effect (meaning this test can be used) for the duration of the COVID-19 declaration under Se ction 564(b)(1) of the Act, 21 U.S.C. section 360bbb-3(b)(1), unless the authorization is terminated or revoked sooner.  Performed at [facility], [address], [place], [place]       Radiology Studies: DG Chest 2 View  Result Date: 06/21/2020 CLINICAL DATA:  Edema.  Bilateral leg swelling. EXAM: CHEST - 2 VIEW COMPARISON:  07/26/2017 FINDINGS: Mild cardiomegaly. Moderate aortic atherosclerosis. Mitral annulus calcifications are seen. There are small bilateral pleural effusions  and adjacent atelectasis. Fluid tracks in the right minor fissure. Vascular congestion with mild peribronchial thickening. No confluent consolidation. No pneumothorax. No acute osseous abnormalities are seen. IMPRESSION: Mild CHF. Cardiomegaly, vascular congestion, and small pleural effusions. Electronically Signed   By: Narda Rutherford M.D.   On: 06/21/2020 16:55     Scheduled Meds: . aspirin EC  81 mg Oral Daily  . atorvastatin  40 mg Oral q1800  . carvedilol  3.125 mg Oral BID WC  . enoxaparin (LOVENOX) injection  40 mg Subcutaneous Q24H  . furosemide  40 mg Intravenous Daily  . lisinopril  20 mg Oral Daily   And  . hydrochlorothiazide  12.5 mg Oral Daily  . sodium chloride flush  3 mL Intravenous Q12H   Continuous Infusions: . sodium chloride       LOS: 1  day   Time Spent in minutes   45 minutes  Ainsleigh Kakos D.O. on 06/22/2020 at 8:45 AM  Between 7am to 7pm - Please see pager noted on [email or web address]  After 7pm go to [email or web address]  And look for the night coverage person covering for me after hours  [facility] Office  [phone]

## 2020-06-22 NOTE — Progress Notes (Signed)
Pt is refusing initial unit assessment until she is finished eating will continue to monitor. vitals checked, pt on cardiac monitor.

## 2020-06-22 NOTE — ED Notes (Signed)
Walked patient to the bathroom patient did well patient is now back in bed on the monitor with daughter at bedside

## 2020-06-23 ENCOUNTER — Inpatient Hospital Stay (HOSPITAL_COMMUNITY): Payer: Medicaid Other

## 2020-06-23 ENCOUNTER — Encounter (HOSPITAL_COMMUNITY): Payer: Self-pay | Admitting: Internal Medicine

## 2020-06-23 DIAGNOSIS — M7989 Other specified soft tissue disorders: Secondary | ICD-10-CM

## 2020-06-23 DIAGNOSIS — I5031 Acute diastolic (congestive) heart failure: Secondary | ICD-10-CM

## 2020-06-23 DIAGNOSIS — R0602 Shortness of breath: Secondary | ICD-10-CM

## 2020-06-23 LAB — CBC
HCT: 27.9 % — ABNORMAL LOW (ref 36.0–46.0)
Hemoglobin: 10.2 g/dL — ABNORMAL LOW (ref 12.0–15.0)
MCH: 27.2 pg (ref 26.0–34.0)
MCHC: 36.6 g/dL — ABNORMAL HIGH (ref 30.0–36.0)
MCV: 74.4 fL — ABNORMAL LOW (ref 80.0–100.0)
Platelets: 164 10*3/uL (ref 150–400)
RBC: 3.75 MIL/uL — ABNORMAL LOW (ref 3.87–5.11)
RDW: 16.6 % — ABNORMAL HIGH (ref 11.5–15.5)
WBC: 6.1 10*3/uL (ref 4.0–10.5)
nRBC: 0 % (ref 0.0–0.2)

## 2020-06-23 LAB — BASIC METABOLIC PANEL
Anion gap: 7 (ref 5–15)
BUN: 18 mg/dL (ref 8–23)
CO2: 29 mmol/L (ref 22–32)
Calcium: 7.9 mg/dL — ABNORMAL LOW (ref 8.9–10.3)
Chloride: 95 mmol/L — ABNORMAL LOW (ref 98–111)
Creatinine, Ser: 0.9 mg/dL (ref 0.44–1.00)
GFR, Estimated: >60 mL/min (ref >60)
Glucose, Bld: 84 mg/dL (ref 70–99)
Potassium: 3.2 mmol/L — ABNORMAL LOW (ref 3.5–5.1)
Sodium: 131 mmol/L — ABNORMAL LOW (ref 135–145)

## 2020-06-23 LAB — TSH: TSH: 1.575 u[IU]/mL (ref 0.350–4.500)

## 2020-06-23 LAB — VITAMIN D 25 HYDROXY (VIT D DEFICIENCY, FRACTURES): Vit D, 25-Hydroxy: 31.12 ng/mL (ref 30–100)

## 2020-06-23 LAB — VITAMIN B12: Vitamin B-12: 867 pg/mL (ref 180–914)

## 2020-06-23 LAB — MAGNESIUM: Magnesium: 1.5 mg/dL — ABNORMAL LOW (ref 1.7–2.4)

## 2020-06-23 MED ORDER — MAGNESIUM SULFATE 4 GM/100ML IV SOLN
4.0000 g | Freq: Once | INTRAVENOUS | Status: AC
  Administered 2020-06-23: 4 g via INTRAVENOUS
  Filled 2020-06-23: qty 100

## 2020-06-23 MED ORDER — POTASSIUM CHLORIDE CRYS ER 20 MEQ PO TBCR
40.0000 meq | EXTENDED_RELEASE_TABLET | Freq: Once | ORAL | Status: AC
  Administered 2020-06-23: 40 meq via ORAL
  Filled 2020-06-23: qty 2

## 2020-06-23 NOTE — Progress Notes (Addendum)
Heart Failure Stewardship Pharmacist Progress Note   PCP: Jackie Plum, MD PCP-Cardiologist: No primary care provider on file.    HPI:  87 yo F with PMH of HTN, HLD, prior stroke, and MI. He presented to the ED on 06/21/20 with LE edema and fatigue. She has been admitted for new onset CHF. An ECHO was done on 06/22/20 and LVEF was 60-65%. CXR on 06/21/20 with mild CHF.   Current HF Medications: Furosemide 40 mg IV daily Carvedilol 3.125 mg BID Lisinopril 20 mg daily  Prior to admission HF Medications: Lisinopril 20 mg daily  Pertinent Lab Values: . Serum creatinine 0.90, BUN 18, Potassium 3.2, Sodium 131, BNP 550.4, Magnesium 1.5  Vital Signs: . Weight: 139 lbs (admission weight: 143 lbs) . Blood pressure: 120-130/60s  . Heart rate: 80s   Medication Assistance / Insurance Benefits Check: Does the patient have prescription insurance?  Yes Type of insurance plan: [place] Medicaid  Outpatient Pharmacy:  Prior to admission outpatient pharmacy: [facility] Is the patient willing to use [facility] T[facility] pharmacy at discharge? Yes Is the patient willing to transition their outpatient pharmacy to utilize a [facility] outpatient pharmacy?   Pending    Assessment: 1. Acute on chronic diastolic CHF (EF 01-74%), due to presumed NICM. NYHA class II symptoms. - Continue furosemide 40 mg IV daily. Potassium 40 mEq x 1 given. Magnesium not yet replaced.  - Continue carvedilol 3.125 mg BID - Continue lisinopril 20 mg daily - Consider starting Jardiance 10 mg daily prior to discharge for HFpEF   Plan: 1) Medication changes recommended at this time: - Replace magnesium  - Add Jardiance 10 mg daily prior to discharge  2) Patient assistance: - Patient has Medicaid - all prescriptions should be $0-3 each per month  3)  Education  - To be completed prior to discharge  Sharen Hones, PharmD, BCPS Heart Failure Stewardship Pharmacist Phone [phone]

## 2020-06-23 NOTE — Progress Notes (Signed)
Lower extremity venous bilateral study completed.   Please see CV Proc for preliminary results.   Dagoberto Nealy, RDMS, RVT  

## 2020-06-23 NOTE — Consult Note (Addendum)
Cardiology Consultation:   Patient ID: Ninfa Giannelli MRN: [id number]; DOB: 11-May-1932  Admit date: 06/21/2020 Date of Consult: 06/23/2020  PCP:  Jackie Plum, MD   [facility]  Cardiologist:  Jodelle Red, MD  Advanced Practice Provider:  No care team member to display Electrophysiologist:  None    Patient Profile:   Erin Patterson is a 87 y.o. female with a hx of hypertension, hyperlipidemia, and history of ischemic CVA who is being seen today for the evaluation of acute CHF at the request of Dr. Catha Gosselin.  History of Present Illness:   Erin Patterson is a 87 year old female with past medical history of  hypertension, hyperlipidemia, and history of ischemic CVA.  Although previous note mention she had CAD s/p MI, however patient absolutely denies this.  She says she was evaluated several years ago in [place] for palpitation however never went through invasive procedures such as cardiac catheterization, nor was she ever told she had a heart attack.  She was first evaluated by Dr. Jodelle Red on 10/03/2017 as follow-up after her stroke in June 2019.  As part of her stroke eval, she had both TTE and TEE which showed moderate MV and AV calcification with normal EF.  Neurology service recommended 3 weeks of dual antiplatelet therapy with aspirin alone after that.  Her monitor did not show any atrial fibrillation.  She was discharged on 5 mg of lisinopril, however due to severely uncontrolled high blood pressure, lisinopril was eventually increased to 20 mg daily.  Chlorthalidone 12.5 mg daily was later added to her medical regimen.  Her blood pressure improved to 130s after medication adjustment.  By early 2020, her PCP has consolidated lisinopril and chlorthalidone to lisinopril-hydrochlorothiazide combination pill.  She has been taking her blood pressure medication religiously and her systolic blood pressure has been ranging in the 120s to 130s range.  She  has been lost to follow-up since 2019.  Patient was evaluated by Dr. Myna Hidalgo of oncology service on 09/14/2019 due to abnormal protein electrophoresis.  Most likely etiology was attributed to inflammation, suspicion for polyclonal myeloma was low very low.  Patient was in her usual state of health until this past week, she started noticing increasing lower extremity edema, worsening dyspnea on exertion and fatigue.  She denies any chest pain.  She does not have any fever, chill, cough, orthopnea or PND.  Patient eventually sought medical attention at [facility].  On arrival, her BNP was 550.4.  AST elevated at 128, ALT 63.  Hemoglobin 11.2.  COVID test negative.  Chest x-ray shows mild CHF with small pleural effusions.  Echocardiogram obtained on 06/22/2020 showed EF 60 to 65%, moderate LVH, diastolic function could not be evaluated, normal RV size and RV systolic function, mild MR, mild to moderate mitral annular calcification, mild AI.  Venous Doppler of lower extremity was negative for DVT.  Cardiology has been consulted for acute CHF exacerbation.   Past Medical History:  Diagnosis Date  . Hypercholesteremia   . Hypertension   . MI (myocardial infarction) (HCC)   . Stroke (cerebrum) (HCC)    years ago--episode of "bleeding from right ear and onset of weakness"     Past Surgical History:  Procedure Laterality Date  . TEE WITHOUT CARDIOVERSION N/A 07/30/2017   Procedure: TRANSESOPHAGEAL ECHOCARDIOGRAM (TEE);  Surgeon: Laurey Morale, MD;  Location: [facility] ENDOSCOPY;  Service: Cardiovascular;  Laterality: N/A;     Home Medications:  Prior to Admission medications  Medication Sig Start Date End Date Taking? Authorizing Provider  lisinopril-hydrochlorothiazide (PRINZIDE,ZESTORETIC) 20-12.5 MG tablet Take 1 tablet by mouth daily. 03/31/18  Yes [provider]  aspirin 81 MG EC tablet Take 1 tablet (81 mg total) by mouth daily. Patient not taking: No sig reported 08/08/17   Love,  Evlyn Kanner, PA-C  atorvastatin (LIPITOR) 40 MG tablet Take 1 tablet (40 mg total) by mouth daily at 6 PM. Patient not taking: Reported on 06/21/2020 08/08/17   Jacquelynn Cree, PA-C    Inpatient Medications: Scheduled Meds: . aspirin EC  81 mg Oral Daily  . atorvastatin  40 mg Oral q1800  . carvedilol  3.125 mg Oral BID WC  . enoxaparin (LOVENOX) injection  40 mg Subcutaneous Q24H  . furosemide  40 mg Intravenous Daily  . lisinopril  20 mg Oral Daily   And  . hydrochlorothiazide  12.5 mg Oral Daily  . sodium chloride flush  3 mL Intravenous Q12H   Continuous Infusions: . sodium chloride    . magnesium sulfate bolus IVPB     PRN Meds: sodium chloride, acetaminophen, ondansetron (ZOFRAN) IV, sodium chloride flush  Allergies:   No Known Allergies  Social History:   Social History   Socioeconomic History  . Marital status: Married    Spouse name: Not on file  . Number of children: Not on file  . Years of education: Not on file  . Highest education level: Not on file  Occupational History  . Not on file  Tobacco Use  . Smoking status: Never Smoker  . Smokeless tobacco: Never Used  Vaping Use  . Vaping Use: Never used  Substance and Sexual Activity  . Alcohol use: Never  . Drug use: Never  . Sexual activity: Not on file  Other Topics Concern  . Not on file  Social History Narrative  . Not on file   Social Determinants of Health   Financial Resource Strain: Not on file  Food Insecurity: Not on file  Transportation Needs: Not on file  Physical Activity: Not on file  Stress: Not on file  Social Connections: Not on file  Intimate Partner Violence: Not on file    Family History:    Family History  Problem Relation Age of Onset  . Hypertension Neg Hx   . Stroke Neg Hx      ROS:  Please see the history of present illness.   All other ROS reviewed and negative.     Physical Exam/Data:   Vitals:   06/22/20 2349 06/23/20 0405 06/23/20 0823 06/23/20 0943  BP:  117/65 117/64 122/64 130/67  Pulse: 80 80 78 77  Resp: 18 18  20   Temp: 98.7 F (37.1 C) 98.9 F (37.2 C) 98.1 F (36.7 C) (!) 97.5 F (36.4 C)  TempSrc: Oral Oral Oral Oral  SpO2: 98% 97% 98% 99%  Weight:  63.3 kg      Intake/Output Summary (Last 24 hours) at 06/23/2020 1459 Last data filed at 06/22/2020 1940 Gross per 24 hour  Intake 240 ml  Output --  Net 240 ml   Last 3 Weights 06/23/2020 06/22/2020 09/14/2019  Weight (lbs) 139 lb 8.8 oz 143 lb 4.8 oz 118 lb  Weight (kg) 63.3 kg 65 kg 53.524 kg     Body mass index is 26.37 kg/m.  General:  Well nourished, well developed, in no acute distress HEENT: normal Lymph: no adenopathy Neck: no JVD Endocrine:  No thryomegaly Vascular: No carotid bruits; FA pulses 2+  bilaterally without bruits  Cardiac:  normal S1, S2; RRR; no murmur  Lungs:  clear to auscultation bilaterally, no wheezing, rhonchi or rales  Abd: soft, nontender, no hepatomegaly  Ext: 1-2+ leg edema Musculoskeletal:  No deformities, BUE and BLE strength normal and equal Skin: warm and dry  Neuro:  CNs 2-12 intact, no focal abnormalities noted Psych:  Normal affect   EKG:  The EKG was personally reviewed and demonstrates: Normal sinus rhythm without significant ST-T wave changes Telemetry:  Telemetry was personally reviewed and demonstrates: Normal sinus rhythm without significant ventricular ectopy  Relevant CV Studies:  Echo 06/22/2020 1. Left ventricular ejection fraction, by estimation, is 60 to 65%. The  left ventricle has normal function. The left ventricle has no regional  wall motion abnormalities. There is moderate concentric left ventricular  hypertrophy. Left ventricular  diastolic function could not be evaluated.  2. Right ventricular systolic function is normal. The right ventricular  size is normal.  3. The mitral valve is degenerative. Mild mitral valve regurgitation. No  evidence of mitral stenosis. Moderate to severe mitral annular   calcification.  4. The aortic valve is tricuspid. There is mild calcification of the  aortic valve. Aortic valve regurgitation is mild. No aortic stenosis is  present.   Laboratory Data:  High Sensitivity Troponin:  No results for input(s): TROPONINIHS in the last 720 hours.   Chemistry Recent Labs  Lab 06/21/20 1616 06/22/20 0135 06/23/20 0416  NA 133* 132* 131*  K 4.2 3.5 3.2*  CL 99 99 95*  CO2 29 27 29   GLUCOSE 96 91 84  BUN 11 8 18   CREATININE 0.73 0.69 0.90  CALCIUM 8.1* 7.8* 7.9*  GFRNONAA >60 >60 >60  ANIONGAP 5 6 7     Recent Labs  Lab 06/21/20 1616  PROT 6.7  ALBUMIN 2.3*  AST 128*  ALT 63*  ALKPHOS 93  BILITOT 1.0   Hematology Recent Labs  Lab 06/21/20 1616 06/23/20 0416  WBC 6.6 6.1  RBC 4.15 3.75*  HGB 11.2* 10.2*  HCT 32.0* 27.9*  MCV 77.1* 74.4*  MCH 27.0 27.2  MCHC 35.0 36.6*  RDW 17.4* 16.6*  PLT 223 164   BNP Recent Labs  Lab 06/21/20 1616  BNP 550.4*    DDimer No results for input(s): DDIMER in the last 168 hours.   Radiology/Studies:  DG Chest 2 View  Result Date: 06/21/2020 CLINICAL DATA:  Edema.  Bilateral leg swelling. EXAM: CHEST - 2 VIEW COMPARISON:  07/26/2017 FINDINGS: Mild cardiomegaly. Moderate aortic atherosclerosis. Mitral annulus calcifications are seen. There are small bilateral pleural effusions and adjacent atelectasis. Fluid tracks in the right minor fissure. Vascular congestion with mild peribronchial thickening. No confluent consolidation. No pneumothorax. No acute osseous abnormalities are seen. IMPRESSION: Mild CHF. Cardiomegaly, vascular congestion, and small pleural effusions. Electronically Signed   By: Narda Rutherford M.D.   On: 06/21/2020 16:55   ECHOCARDIOGRAM COMPLETE  Result Date: 06/22/2020    ECHOCARDIOGRAM REPORT   Patient Name:   Erin Patterson [facility] Date of Exam: 06/22/2020 Medical Rec #:  [id number]      Height:       61.0 in Accession #:    [id number]     Weight:       118.0 lb Date of Birth:  November 06, 1932        BSA:          1.509 m Patient Age:    87 years       BP:  124/86 mmHg Patient Gender: F              HR:           82 bpm. Exam Location:  Inpatient Procedure: 2D Echo, Cardiac Doppler and Color Doppler Indications:    CHF  History:        Patient has prior history of Echocardiogram examinations, most                 recent 07/28/2017. Previous Myocardial Infarction, Stroke; Risk                 Factors:Hypertension.  Sonographer:    Shirlean KellyJohn Mendel Brown Referring Phys: [phone] [id number] JARED M GARDNER IMPRESSIONS  1. Left ventricular ejection fraction, by estimation, is 60 to 65%. The left ventricle has normal function. The left ventricle has no regional wall motion abnormalities. There is moderate concentric left ventricular hypertrophy. Left ventricular diastolic function could not be evaluated.  2. Right ventricular systolic function is normal. The right ventricular size is normal.  3. The mitral valve is degenerative. Mild mitral valve regurgitation. No evidence of mitral stenosis. Moderate to severe mitral annular calcification.  4. The aortic valve is tricuspid. There is mild calcification of the aortic valve. Aortic valve regurgitation is mild. No aortic stenosis is present. FINDINGS  Left Ventricle: Left ventricular ejection fraction, by estimation, is 60 to 65%. The left ventricle has normal function. The left ventricle has no regional wall motion abnormalities. The left ventricular internal cavity size was normal in size. There is  moderate concentric left ventricular hypertrophy. Left ventricular diastolic function could not be evaluated due to mitral annular calcification (moderate or greater). Left ventricular diastolic function could not be evaluated. Right Ventricle: The right ventricular size is normal. No increase in right ventricular wall thickness. Right ventricular systolic function is normal. Left Atrium: Left atrial size was normal in size. Right Atrium: Right atrial size was normal in size.  Pericardium: Trivial pericardial effusion is present. Presence of pericardial fat pad. Mitral Valve: The mitral valve is degenerative in appearance. Moderate to severe mitral annular calcification. Mild mitral valve regurgitation. No evidence of mitral valve stenosis. Tricuspid Valve: The tricuspid valve is grossly normal. Tricuspid valve regurgitation is trivial. No evidence of tricuspid stenosis. Aortic Valve: The aortic valve is tricuspid. There is mild calcification of the aortic valve. Aortic valve regurgitation is mild. Aortic regurgitation PHT measures 488 msec. No aortic stenosis is present. Aortic valve mean gradient measures 8.0 mmHg. Aortic valve peak gradient measures 13.8 mmHg. Aortic valve area, by VTI measures 1.91 cm. Pulmonic Valve: The pulmonic valve was grossly normal. Pulmonic valve regurgitation is not visualized. No evidence of pulmonic stenosis. Aorta: The aortic root and ascending aorta are structurally normal, with no evidence of dilitation. Venous: The inferior vena cava was not well visualized. IAS/Shunts: The atrial septum is grossly normal. Additional Comments: There is a small pleural effusion in the left lateral region.  LEFT VENTRICLE PLAX 2D LVIDd:         3.50 cm     Diastology LVIDs:         2.20 cm     LV e' medial:    4.13 cm/s LV PW:         1.60 cm     LV E/e' medial:  25.2 LV IVS:        1.40 cm     LV e' lateral:   5.98 cm/s LVOT diam:     2.00 cm  LV E/e' lateral: 17.4 LV SV:         69 LV SV Index:   46 LVOT Area:     3.14 cm  LV Volumes (MOD) LV vol d, MOD A2C: 60.8 ml LV vol d, MOD A4C: 61.4 ml LV vol s, MOD A2C: 20.3 ml LV vol s, MOD A4C: 19.8 ml LV SV MOD A2C:     40.5 ml LV SV MOD A4C:     61.4 ml LV SV MOD BP:      42.2 ml RIGHT VENTRICLE RV Basal diam:  3.30 cm RV S prime:     13.30 cm/s TAPSE (M-mode): 2.5 cm LEFT ATRIUM             Index       RIGHT ATRIUM           Index LA diam:        3.80 cm 2.52 cm/m  RA Area:     12.70 cm LA Vol (A2C):   56.5 ml 37.43  ml/m RA Volume:   27.10 ml  17.95 ml/m LA Vol (A4C):   41.6 ml 27.56 ml/m LA Biplane Vol: 50.4 ml 33.39 ml/m  AORTIC VALVE AV Area (Vmax):    1.91 cm AV Area (Vmean):   1.91 cm AV Area (VTI):     1.91 cm AV Vmax:           186.00 cm/s AV Vmean:          132.000 cm/s AV VTI:            0.360 m AV Peak Grad:      13.8 mmHg AV Mean Grad:      8.0 mmHg LVOT Vmax:         113.00 cm/s LVOT Vmean:        80.100 cm/s LVOT VTI:          0.219 m LVOT/AV VTI ratio: 0.61 AI PHT:            488 msec  AORTA Ao Root diam: 3.20 cm MITRAL VALVE                TRICUSPID VALVE MV Area (PHT): 3.21 cm     TR Peak grad:   17.3 mmHg MV Decel Time: 236 msec     TR Vmax:        208.00 cm/s MV E velocity: 104.00 cm/s MV A velocity: 134.00 cm/s  SHUNTS MV E/A ratio:  0.78         Systemic VTI:  0.22 m                             Systemic Diam: 2.00 cm Lennie Odor MD Electronically signed by Lennie Odor MD Signature Date/Time: 06/22/2020/3:59:06 PM    Final    VAS [place] LOWER EXTREMITY VENOUS (DVT)  Result Date: 06/23/2020  Lower Venous DVT Study Patient Name:  Erin Patterson [facility]  Date of Exam:   06/23/2020 Medical Rec #: [id number]       Accession #:    [id number] Date of Birth: [birth year]        Patient Gender: F Patient Age:   79Y Exam Location:  [facility] Procedure:      VAS [place] LOWER EXTREMITY VENOUS (DVT) Referring Phys: [id number] [facility] MIKHAIL --------------------------------------------------------------------------------  Indications: Swelling, and SOB.  Limitations: Poor ultrasound/tissue interface. Comparison Study: No prior studies. Performing Technologist: Jean Rosenthal RDMS,RVT  Examination Guidelines: A complete evaluation includes B-mode imaging, spectral Doppler, color Doppler, and power Doppler as needed of all accessible portions of each vessel. Bilateral testing is considered an integral part of a complete examination. Limited examinations for reoccurring indications may be performed as noted. The reflux  portion of the exam is performed with the patient in reverse Trendelenburg.  +---------+---------------+---------+-----------+----------+-------------------+ RIGHT    CompressibilityPhasicitySpontaneityPropertiesThrombus Aging      +---------+---------------+---------+-----------+----------+-------------------+ CFV      Full           Yes      Yes                                      +---------+---------------+---------+-----------+----------+-------------------+ SFJ      Full                                                             +---------+---------------+---------+-----------+----------+-------------------+ FV Prox  Full                                                             +---------+---------------+---------+-----------+----------+-------------------+ FV Mid   Full                                                             +---------+---------------+---------+-----------+----------+-------------------+ FV DistalFull                                                             +---------+---------------+---------+-----------+----------+-------------------+ PFV      Full                                                             +---------+---------------+---------+-----------+----------+-------------------+ POP      Full           Yes      Yes                                      +---------+---------------+---------+-----------+----------+-------------------+ PTV      Full                                                             +---------+---------------+---------+-----------+----------+-------------------+ PERO  Yes      Yes                  Not well visualized +---------+---------------+---------+-----------+----------+-------------------+   +---------+---------------+---------+-----------+----------+-------------------+ LEFT     CompressibilityPhasicitySpontaneityPropertiesThrombus Aging       +---------+---------------+---------+-----------+----------+-------------------+ CFV      Full           Yes      Yes                                      +---------+---------------+---------+-----------+----------+-------------------+ SFJ      Full                                                             +---------+---------------+---------+-----------+----------+-------------------+ FV Prox  Full                                                             +---------+---------------+---------+-----------+----------+-------------------+ FV Mid   Full                                                             +---------+---------------+---------+-----------+----------+-------------------+ FV DistalFull                                                             +---------+---------------+---------+-----------+----------+-------------------+ PFV      Full                                                             +---------+---------------+---------+-----------+----------+-------------------+ POP      Full           Yes      Yes                                      +---------+---------------+---------+-----------+----------+-------------------+ PTV                     Yes      Yes                  Not well visualized +---------+---------------+---------+-----------+----------+-------------------+ PERO                    Yes      Yes                  Not well visualized +---------+---------------+---------+-----------+----------+-------------------+    Summary:  RIGHT: - There is no evidence of deep vein thrombosis in the lower extremity. However, portions of this examination were limited- see technologist comments above.  - A cystic structure is found in the popliteal fossa. - Ultrasound characteristics of enlarged lymph nodes are noted in the groin.  LEFT: - There is no evidence of deep vein thrombosis in the lower extremity. However, portions of this  examination were limited- see technologist comments above.  - A cystic structure is found in the popliteal fossa. - Ultrasound characteristics of enlarged lymph nodes noted in the groin.  *See table(s) above for measurements and observations.    Preliminary      Assessment and Plan:   1. Most likely acute diastolic heart failure:  -Unclear precipitating factor.  Despite her history of high blood pressure, her blood pressure has been well controlled in the 120s to 130s recently according to the patient.  -She has been compliant with lisinopril-hydrochlorothiazide at home, chest x-ray concerning for pulmonary edema.  -Home medication has been continued, low-dose carvedilol was added to her medical regimen.  She has also been started on IV Lasix 40 mg daily.  -Echocardiogram shows normal ejection fraction, normal RVSP, mild valve disease.  -We will continue with IV diuresis for now.  2. Hypertension: Low-dose carvedilol added to home regimen of lisinopril-hydrochlorothiazide, blood pressure very well controlled .  3. Hyperlipidemia: On 40 mg daily of Lipitor at home.  AST elevated on arrival.  Consider repeat lab work prior to discharge.  4. History of CVA in 2019: On aspirin and Lipitor.   Risk Assessment/Risk Scores:        New York Heart Association (NYHA) Functional Class NYHA Class III        For questions or updates, please contact [facility] Please consult [email or web address] for contact info under    Signed, Azalee Course, [place]  06/23/2020 2:59 PM  Personally seen and examined. Agree with above.  87 year old here with acute diastolic heart failure with lower extremity edema, weakness fatigue shortness of breath.  Overall she has improved slightly since being admitted but she is still tired.  IV diuretics have been utilized.  Slight increase in creatinine noted on labs reviewed as above.  She has had difficulties with severely high blood pressure in the past but this seems to be  fairly well controlled currently.  Medications reviewed as above.  On exam, comfortable in bed normal respiratory effort alert and oriented x3.  We utilized a Systems developer on iPad device to help [place].  2+ fairly tight lower extremity edema.  Echo EF 60% with moderate LVH.  Assessment and plan:  Acute diastolic heart failure Hypertension Hyperlipidemia History of CVA  -Lets continue with IV Lasix 40 mg for now.  Still has some lower extremity edema despite laying in bed.  Chest x-ray was concerning previously for pulmonary edema.  Blood pressure under excellent control currently.  Echocardiogram also reassuring.  Minimal valvular disease.  Continue with atorvastatin.  Liver functions were mildly elevated.  Donato Schultz, MD

## 2020-06-23 NOTE — Progress Notes (Signed)
PROGRESS NOTE    Erin LambUong Thi Patterson  ZOX:[id number] DOB: 02/06/1933 DOA: 06/21/2020 PCP: Erin Plumsei-Bonsu, George, MD   Brief Narrative:  On 06/21/2020 by Dr. Lyda PeroneJared Patterson Erin Patterson is a 87 y.o. female with medical history significant of HTN, HLD, prior stroke and MI.  Pt presents to ED with c/o increased BLE edema and fatigue.  Symptoms onset about 1 month ago.  Worsening, persistent, now severe.  Associated SOB that is worse when she lays down flat.  No CP.  No cough, no fevers nor chills.  Interim history Patient presented with leg swelling and fatigue.  Found to have elevated BNP and mild CHF findings on x-ray.  Patient admitted with suspected new onset CHF.  Pending work-up.  On IV Lasix. Assessment & Plan   Lower extremity edema/anasarca, suspect new onset diastolic CHF -Patient with no history of CHF -Last echocardiogram/TEE done 07/30/2017 which shows an EF of 55 to 60%, diffuse hypokinesis -Patient presented with lower extremity swelling as well as fatigue. -BNP 550.4 -Chest x-ray showed mild CHF, vascular congestion and small pleural effusions -Continue IV Lasix 40 mg daily -Echocardiogram this admission showed an EF of 60 to 65% no regional wall motion abnormalities.  LVH.  LV diastolic function could not be evaluated.  RV systolic function normal.  Mitral valve is degenerative.  Mild mitral valve regurgitation.  Moderate to severe mitral annular calcification -Monitor intake and output, daily weights-weight down approximately 2 kg since admission -Pending lower extremity Doppler -Cardiology consulted and appreciated  Fatigue -Patient continues to complain of feeling weak and tired especially with walking -Suspect secondary to the above -Will obtain TSH, vitamin B12 level, vitamin D level  Essential hypertension -Continue Coreg, lisinopril, HCTZ -Continue IV Lasix  History of CVA -Continue statin, aspirin  Mild hyponatremia -Likely secondary to hypervolemia versus  diuretics -Continue to monitor BMP  Hypokalemia -Likely secondary to diarrhea -Will replace and continue to monitor -Obtain magnesium level   DVT Prophylaxis Lovenox  Code Status: Full  Family Communication: Daughter at bedside  Disposition Plan:  Status is: Inpatient  Remains inpatient appropriate because:IV treatments appropriate due to intensity of illness or inability to take PO   Dispo: The patient is from: Home              Anticipated d/c is to: Home              Patient currently is not medically stable to d/c.   Difficult to place patient No   Consultants Cardiology  Procedures  Echocardiogram  Antibiotics   Anti-infectives (From admission, onward)   None      Subjective:   Erin Patterson seen and examined today.  Translator used.  Patient continues to complain of lower extremity swelling but feels it is improved.  States it was all the way up to her abdomen prior to admission.  Continues to complain of fatigue with limited movement.  Denies current chest pain or shortness of breath, denies any abdominal pain, nausea or vomiting, diarrhea or constipation, dizziness or headache.  Objective:   Vitals:   06/22/20 2002 06/22/20 2349 06/23/20 0405 06/23/20 0823  BP: (!) 142/71 117/65 117/64 122/64  Pulse: 79 80 80 78  Resp: 18 18 18    Temp: 99 F (37.2 C) 98.7 F (37.1 C) 98.9 F (37.2 C) 98.1 F (36.7 C)  TempSrc: Oral Oral Oral Oral  SpO2: 98% 98% 97% 98%  Weight:   63.3 kg     Intake/Output Summary (Last 24 hours)  at 06/23/2020 0900 Last data filed at 06/22/2020 1940 Gross per 24 hour  Intake 240 ml  Output --  Net 240 ml   Filed Weights   06/22/20 1321 06/23/20 0405  Weight: 65 kg 63.3 kg   Exam  General: Well developed, well nourished, elderly, NAD  HEENT: NCAT,  mucous membranes moist.   Cardiovascular: S1 S2 auscultated, soft SEM, RRR  Respiratory: Clear to auscultation bilaterally with equal chest rise, no wheezing or  rhonchi  Abdomen: Soft, nontender, nondistended, + bowel sounds  Extremities: warm dry without cyanosis clubbing. +++  Lower extremity edema bilaterally, mild improvement  Neuro: AAOx3, nonfocal  Psych: pleasant, appropriate mood and affect   Data Reviewed: I have personally reviewed following labs and imaging studies  CBC: Recent Labs  Lab 06/21/20 1616 06/23/20 0416  WBC 6.6 6.1  NEUTROABS 3.2  --   HGB 11.2* 10.2*  HCT 32.0* 27.9*  MCV 77.1* 74.4*  PLT 223 164   Basic Metabolic Panel: Recent Labs  Lab 06/21/20 1616 06/22/20 0135 06/23/20 0416  NA 133* 132* 131*  K 4.2 3.5 3.2*  CL 99 99 95*  CO2 29 27 29   GLUCOSE 96 91 84  BUN 11 8 18   CREATININE 0.73 0.69 0.90  CALCIUM 8.1* 7.8* 7.9*   GFR: CrCl cannot be calculated (Unknown ideal weight.). Liver Function Tests: Recent Labs  Lab 06/21/20 1616  AST 128*  ALT 63*  ALKPHOS 93  BILITOT 1.0  PROT 6.7  ALBUMIN 2.3*   No results for input(s): LIPASE, AMYLASE in the last 168 hours. No results for input(s): AMMONIA in the last 168 hours. Coagulation Profile: No results for input(s): INR, PROTIME in the last 168 hours. Cardiac Enzymes: No results for input(s): CKTOTAL, CKMB, CKMBINDEX, TROPONINI in the last 168 hours. BNP (last 3 results) No results for input(s): PROBNP in the last 8760 hours. HbA1C: No results for input(s): HGBA1C in the last 72 hours. CBG: No results for input(s): GLUCAP in the last 168 hours. Lipid Profile: No results for input(s): CHOL, HDL, LDLCALC, TRIG, CHOLHDL, LDLDIRECT in the last 72 hours. Thyroid Function Tests: No results for input(s): TSH, T4TOTAL, FREET4, T3FREE, THYROIDAB in the last 72 hours. Anemia Panel: No results for input(s): VITAMINB12, FOLATE, FERRITIN, TIBC, IRON, RETICCTPCT in the last 72 hours. Urine analysis:    Component Value Date/Time   COLORURINE COLORLESS (A) 07/26/2017 0950   APPEARANCEUR CLEAR 07/26/2017 0950   LABSPEC 1.004 (L) 07/26/2017 0950    PHURINE 8.0 07/26/2017 0950   GLUCOSEU 50 (A) 07/26/2017 0950   HGBUR SMALL (A) 07/26/2017 0950   BILIRUBINUR NEGATIVE 07/26/2017 0950   KETONESUR NEGATIVE 07/26/2017 0950   PROTEINUR 30 (A) 07/26/2017 0950   UROBILINOGEN 1.0 12/10/2014 1225   NITRITE NEGATIVE 07/26/2017 0950   LEUKOCYTESUR TRACE (A) 07/26/2017 0950   Sepsis Labs: @LABRCNTIP (procalcitonin:4,lacticidven:4)  ) Recent Results (from the past 240 hour(s))  SARS CORONAVIRUS 2 (TAT 6-24 HRS) Nasopharyngeal Nasopharyngeal Swab     Status: None   Collection Time: 06/21/20 11:24 PM   Specimen: Nasopharyngeal Swab  Result Value Ref Range Status   SARS Coronavirus 2 NEGATIVE NEGATIVE Final    Comment: (NOTE) SARS-CoV-2 target nucleic acids are NOT DETECTED.  The SARS-CoV-2 RNA is generally detectable in upper and lower respiratory specimens during the acute phase of infection. Negative results do not preclude SARS-CoV-2 infection, do not rule out co-infections with other pathogens, and should not be used as the sole basis for treatment or other patient management decisions.  Negative results must be combined with clinical observations, patient history, and epidemiological information. The expected result is Negative.  Fact Sheet for Patients: HairSlick.no  Fact Sheet for Healthcare Providers: quierodirigir.com  This test is not yet approved or cleared by the [place] FDA and  has been authorized for detection and/or diagnosis of SARS-CoV-2 by FDA under an Emergency Use Authorization (EUA). This EUA will remain  in effect (meaning this test can be used) for the duration of the COVID-19 declaration under Se ction 564(b)(1) of the Act, 21 U.S.C. section 360bbb-3(b)(1), unless the authorization is terminated or revoked sooner.  Performed at [facility], [address], [place], [place] [address]       Radiology Studies: DG Chest 2 View  Result  Date: 06/21/2020 CLINICAL DATA:  Edema.  Bilateral leg swelling. EXAM: CHEST - 2 VIEW COMPARISON:  07/26/2017 FINDINGS: Mild cardiomegaly. Moderate aortic atherosclerosis. Mitral annulus calcifications are seen. There are small bilateral pleural effusions and adjacent atelectasis. Fluid tracks in the right minor fissure. Vascular congestion with mild peribronchial thickening. No confluent consolidation. No pneumothorax. No acute osseous abnormalities are seen. IMPRESSION: Mild CHF. Cardiomegaly, vascular congestion, and small pleural effusions. Electronically Signed   By: Narda Rutherford M.D.   On: 06/21/2020 16:55   ECHOCARDIOGRAM COMPLETE  Result Date: 06/22/2020    ECHOCARDIOGRAM REPORT   Patient Name:   BRALEY LUCKENBAUGH [facility] Date of Exam: 06/22/2020 Medical Rec #:  [id number]      Height:       61.0 in Accession #:    [id number]     Weight:       118.0 lb Date of Birth:  1932-06-27       BSA:          1.509 m Patient Age:    87 years       BP:           124/86 mmHg Patient Gender: F              HR:           82 bpm. Exam Location:  Inpatient Procedure: 2D Echo, Cardiac Doppler and Color Doppler Indications:    CHF  History:        Patient has prior history of Echocardiogram examinations, most                 recent 07/28/2017. Previous Myocardial Infarction, Stroke; Risk                 Factors:Hypertension.  Sonographer:    Shirlean Kelly Referring Phys: [phone] Erin M Patterson IMPRESSIONS  1. Left ventricular ejection fraction, by estimation, is 60 to 65%. The left ventricle has normal function. The left ventricle has no regional wall motion abnormalities. There is moderate concentric left ventricular hypertrophy. Left ventricular diastolic function could not be evaluated.  2. Right ventricular systolic function is normal. The right ventricular size is normal.  3. The mitral valve is degenerative. Mild mitral valve regurgitation. No evidence of mitral stenosis. Moderate to severe mitral annular calcification.  4.  The aortic valve is tricuspid. There is mild calcification of the aortic valve. Aortic valve regurgitation is mild. No aortic stenosis is present. FINDINGS  Left Ventricle: Left ventricular ejection fraction, by estimation, is 60 to 65%. The left ventricle has normal function. The left ventricle has no regional wall motion abnormalities. The left ventricular internal cavity size was normal in size. There is  moderate concentric left ventricular hypertrophy.  Left ventricular diastolic function could not be evaluated due to mitral annular calcification (moderate or greater). Left ventricular diastolic function could not be evaluated. Right Ventricle: The right ventricular size is normal. No increase in right ventricular wall thickness. Right ventricular systolic function is normal. Left Atrium: Left atrial size was normal in size. Right Atrium: Right atrial size was normal in size. Pericardium: Trivial pericardial effusion is present. Presence of pericardial fat pad. Mitral Valve: The mitral valve is degenerative in appearance. Moderate to severe mitral annular calcification. Mild mitral valve regurgitation. No evidence of mitral valve stenosis. Tricuspid Valve: The tricuspid valve is grossly normal. Tricuspid valve regurgitation is trivial. No evidence of tricuspid stenosis. Aortic Valve: The aortic valve is tricuspid. There is mild calcification of the aortic valve. Aortic valve regurgitation is mild. Aortic regurgitation PHT measures 488 msec. No aortic stenosis is present. Aortic valve mean gradient measures 8.0 mmHg. Aortic valve peak gradient measures 13.8 mmHg. Aortic valve area, by VTI measures 1.91 cm. Pulmonic Valve: The pulmonic valve was grossly normal. Pulmonic valve regurgitation is not visualized. No evidence of pulmonic stenosis. Aorta: The aortic root and ascending aorta are structurally normal, with no evidence of dilitation. Venous: The inferior vena cava was not well visualized. IAS/Shunts: The  atrial septum is grossly normal. Additional Comments: There is a small pleural effusion in the left lateral region.  LEFT VENTRICLE PLAX 2D LVIDd:         3.50 cm     Diastology LVIDs:         2.20 cm     LV e' medial:    4.13 cm/s LV PW:         1.60 cm     LV E/e' medial:  25.2 LV IVS:        1.40 cm     LV e' lateral:   5.98 cm/s LVOT diam:     2.00 cm     LV E/e' lateral: 17.4 LV SV:         69 LV SV Index:   46 LVOT Area:     3.14 cm  LV Volumes (MOD) LV vol d, MOD A2C: 60.8 ml LV vol d, MOD A4C: 61.4 ml LV vol s, MOD A2C: 20.3 ml LV vol s, MOD A4C: 19.8 ml LV SV MOD A2C:     40.5 ml LV SV MOD A4C:     61.4 ml LV SV MOD BP:      42.2 ml RIGHT VENTRICLE RV Basal diam:  3.30 cm RV S prime:     13.30 cm/s TAPSE (M-mode): 2.5 cm LEFT ATRIUM             Index       RIGHT ATRIUM           Index LA diam:        3.80 cm 2.52 cm/m  RA Area:     12.70 cm LA Vol (A2C):   56.5 ml 37.43 ml/m RA Volume:   27.10 ml  17.95 ml/m LA Vol (A4C):   41.6 ml 27.56 ml/m LA Biplane Vol: 50.4 ml 33.39 ml/m  AORTIC VALVE AV Area (Vmax):    1.91 cm AV Area (Vmean):   1.91 cm AV Area (VTI):     1.91 cm AV Vmax:           186.00 cm/s AV Vmean:          132.000 cm/s AV VTI:  0.360 m AV Peak Grad:      13.8 mmHg AV Mean Grad:      8.0 mmHg LVOT Vmax:         113.00 cm/s LVOT Vmean:        80.100 cm/s LVOT VTI:          0.219 m LVOT/AV VTI ratio: 0.61 AI PHT:            488 msec  AORTA Ao Root diam: 3.20 cm MITRAL VALVE                TRICUSPID VALVE MV Area (PHT): 3.21 cm     TR Peak grad:   17.3 mmHg MV Decel Time: 236 msec     TR Vmax:        208.00 cm/s MV E velocity: 104.00 cm/s MV A velocity: 134.00 cm/s  SHUNTS MV E/A ratio:  0.78         Systemic VTI:  0.22 m                             Systemic Diam: 2.00 cm Lennie Odor MD Electronically signed by Lennie Odor MD Signature Date/Time: 06/22/2020/3:59:06 PM    Final      Scheduled Meds: . aspirin EC  81 mg Oral Daily  . atorvastatin  40 mg Oral q1800  .  carvedilol  3.125 mg Oral BID WC  . enoxaparin (LOVENOX) injection  40 mg Subcutaneous Q24H  . furosemide  40 mg Intravenous Daily  . lisinopril  20 mg Oral Daily   And  . hydrochlorothiazide  12.5 mg Oral Daily  . potassium chloride  40 mEq Oral Once  . sodium chloride flush  3 mL Intravenous Q12H   Continuous Infusions: . sodium chloride       LOS: 2 days   Time Spent in minutes   45 minutes  Vanita Cannell D.O. on 06/23/2020 at 9:00 AM  Between 7am to 7pm - Please see pager noted on [email or web address]  After 7pm go to [email or web address]  And look for the night coverage person covering for me after hours  [facility] Office  [phone]

## 2020-06-24 LAB — BASIC METABOLIC PANEL
Anion gap: 8 (ref 5–15)
BUN: 24 mg/dL — ABNORMAL HIGH (ref 8–23)
CO2: 28 mmol/L (ref 22–32)
Calcium: 7.5 mg/dL — ABNORMAL LOW (ref 8.9–10.3)
Chloride: 95 mmol/L — ABNORMAL LOW (ref 98–111)
Creatinine, Ser: 0.89 mg/dL (ref 0.44–1.00)
GFR, Estimated: >60 mL/min (ref >60)
Glucose, Bld: 86 mg/dL (ref 70–99)
Potassium: 3.4 mmol/L — ABNORMAL LOW (ref 3.5–5.1)
Sodium: 131 mmol/L — ABNORMAL LOW (ref 135–145)

## 2020-06-24 LAB — HEPATIC FUNCTION PANEL
ALT: 46 U/L — ABNORMAL HIGH (ref 0–44)
AST: 87 U/L — ABNORMAL HIGH (ref 15–41)
Albumin: 1.9 g/dL — ABNORMAL LOW (ref 3.5–5.0)
Alkaline Phosphatase: 64 U/L (ref 38–126)
Bilirubin, Direct: 0.2 mg/dL (ref 0.0–0.2)
Indirect Bilirubin: 0.6 mg/dL (ref 0.3–0.9)
Total Bilirubin: 0.8 mg/dL (ref 0.3–1.2)
Total Protein: 5.5 g/dL — ABNORMAL LOW (ref 6.5–8.1)

## 2020-06-24 MED ORDER — POTASSIUM CHLORIDE CRYS ER 20 MEQ PO TBCR
40.0000 meq | EXTENDED_RELEASE_TABLET | Freq: Once | ORAL | Status: AC
  Administered 2020-06-24: 40 meq via ORAL
  Filled 2020-06-24: qty 2

## 2020-06-24 MED ORDER — FUROSEMIDE 10 MG/ML IJ SOLN
40.0000 mg | Freq: Two times a day (BID) | INTRAMUSCULAR | Status: DC
  Administered 2020-06-24 – 2020-06-26 (×4): 40 mg via INTRAVENOUS
  Filled 2020-06-24 (×4): qty 4

## 2020-06-24 NOTE — Progress Notes (Signed)
[place] interpreter used initially with language barrier still present. Translator stated the pt speaks [place] not [place] Pt was instructed to keep urine in measuring hat but continues to remove hat and flush.

## 2020-06-24 NOTE — Progress Notes (Signed)
PROGRESS NOTE    Erin Patterson  IFO:[id number] DOB: 1932/08/18 DOA: 06/21/2020 PCP: Jackie Plum, MD   Brief Narrative:  On 06/21/2020 by Dr. Lyda Perone Erin Patterson is a 87 y.o. female with medical history significant of HTN, HLD, prior stroke and MI.  Pt presents to ED with c/o increased BLE edema and fatigue.  Symptoms onset about 1 month ago.  Worsening, persistent, now severe.  Associated SOB that is worse when she lays down flat.  No CP.  No cough, no fevers nor chills.  Interim history Patient presented with leg swelling and fatigue.  Found to have elevated BNP and mild CHF findings on x-ray.  Patient admitted with suspected new onset CHF.  Pending work-up.  On IV Lasix. Assessment & Plan   Lower extremity edema/anasarca, New onset diastolic CHF -Patient with no history of CHF -Last echocardiogram/TEE done 07/30/2017 which shows an EF of 55 to 60%, diffuse hypokinesis -Patient presented with lower extremity swelling as well as fatigue. -BNP 550.4 -Chest x-ray showed mild CHF, vascular congestion and small pleural effusions -Continue IV Lasix 40 mg daily -Echocardiogram this admission showed an EF of 60 to 65% no regional wall motion abnormalities.  LVH.  LV diastolic function could not be evaluated.  RV systolic function normal.  Mitral valve is degenerative.  Mild mitral valve regurgitation.  Moderate to severe mitral annular calcification -Monitor intake and output, daily weights-weight down approximately 2 kg since admission -Pending lower extremity Doppler -Cardiology consulted and appreciated  Fatigue -Patient continues to complain of feeling weak and tired especially with walking -Suspect secondary to the above -TSH 1.575, vitamin B12 level 867, vitamin D level (25-hydroxy) 31.12  Essential hypertension -Continue Coreg, lisinopril, HCTZ -Continue IV Lasix  History of CVA -Continue statin, aspirin  Mild hyponatremia -Likely secondary to hypervolemia  versus diuretics -Continue to monitor BMP  Hypokalemia/hypomagnesemia -Likely secondary to diuresis  -continue to replace -Continue to monitor  Mildly elevated LFTs -trending downward -Continue to monitor  DVT Prophylaxis Lovenox  Code Status: Full  Family Communication: Husband at bedside  Disposition Plan:  Status is: Inpatient  Remains inpatient appropriate because:IV treatments appropriate due to intensity of illness or inability to take PO   Dispo: The patient is from: Home              Anticipated d/c is to: Home              Patient currently is not medically stable to d/c.   Difficult to place patient No   Consultants Cardiology  Procedures  Echocardiogram  Antibiotics   Anti-infectives (From admission, onward)   None      Subjective:   Erin Patterson seen and examined today.  Translator used.  Continues to feel tired and complaining of lower extremity swelling but feels it is mildly improved.  Denies current chest pain, abdominal pain, nausea or vomiting, diarrhea or constipation, dizziness or headache.   Objective:   Vitals:   06/23/20 1657 06/23/20 2003 06/23/20 2338 06/24/20 0341  BP: 127/76 (!) 113/59 120/68 (!) 112/59  Pulse: 82 77 76 75  Resp:  18 18 18   Temp: 98.5 F (36.9 C) 98.4 F (36.9 C) 98.4 F (36.9 C) 98.3 F (36.8 C)  TempSrc: Oral Oral Oral Oral  SpO2: 98% 95% 96% 94%  Weight:    62.9 kg    Intake/Output Summary (Last 24 hours) at 06/24/2020 0958 Last data filed at 06/24/2020 0829 Gross per 24 hour  Intake 480 ml  Output 801 ml  Net -321 ml   Filed Weights   06/22/20 1321 06/23/20 0405 06/24/20 0341  Weight: 65 kg 63.3 kg 62.9 kg   Exam  General: Well developed, well nourished, elderly, NAD  HEENT: NCAT,  mucous membranes moist.   Cardiovascular: S1 S2 auscultated, soft SEM, RRR  Respiratory: Clear to auscultation bilaterally with equal chest rise, no wheezing or rhonchi  Abdomen: Soft, nontender, nondistended, +  bowel sounds  Extremities: warm dry without cyanosis clubbing. +++  Lower extremity edema bilaterally, mild improvement  Neuro: AAOx3, nonfocal  Psych: pleasant, appropriate mood and affect   Data Reviewed: I have personally reviewed following labs and imaging studies  CBC: Recent Labs  Lab 06/21/20 1616 06/23/20 0416  WBC 6.6 6.1  NEUTROABS 3.2  --   HGB 11.2* 10.2*  HCT 32.0* 27.9*  MCV 77.1* 74.4*  PLT 223 164   Basic Metabolic Panel: Recent Labs  Lab 06/21/20 1616 06/22/20 0135 06/23/20 0416 06/23/20 0942 06/24/20 0313  NA 133* 132* 131*  --  131*  K 4.2 3.5 3.2*  --  3.4*  CL 99 99 95*  --  95*  CO2 29 27 29   --  28  GLUCOSE 96 91 84  --  86  BUN 11 8 18   --  24*  CREATININE 0.73 0.69 0.90  --  0.89  CALCIUM 8.1* 7.8* 7.9*  --  7.5*  MG  --   --   --  1.5*  --    GFR: CrCl cannot be calculated (Unknown ideal weight.). Liver Function Tests: Recent Labs  Lab 06/21/20 1616 06/24/20 0656  AST 128* 87*  ALT 63* 46*  ALKPHOS 93 64  BILITOT 1.0 0.8  PROT 6.7 5.5*  ALBUMIN 2.3* 1.9*   No results for input(s): LIPASE, AMYLASE in the last 168 hours. No results for input(s): AMMONIA in the last 168 hours. Coagulation Profile: No results for input(s): INR, PROTIME in the last 168 hours. Cardiac Enzymes: No results for input(s): CKTOTAL, CKMB, CKMBINDEX, TROPONINI in the last 168 hours. BNP (last 3 results) No results for input(s): PROBNP in the last 8760 hours. HbA1C: No results for input(s): HGBA1C in the last 72 hours. CBG: No results for input(s): GLUCAP in the last 168 hours. Lipid Profile: No results for input(s): CHOL, HDL, LDLCALC, TRIG, CHOLHDL, LDLDIRECT in the last 72 hours. Thyroid Function Tests: Recent Labs    06/23/20 0805  TSH 1.575   Anemia Panel: Recent Labs    06/23/20 0942  VITAMINB12 867   Urine analysis:    Component Value Date/Time   COLORURINE COLORLESS (A) 07/26/2017 0950   APPEARANCEUR CLEAR 07/26/2017 0950    LABSPEC 1.004 (L) 07/26/2017 0950   PHURINE 8.0 07/26/2017 0950   GLUCOSEU 50 (A) 07/26/2017 0950   HGBUR SMALL (A) 07/26/2017 0950   BILIRUBINUR NEGATIVE 07/26/2017 0950   KETONESUR NEGATIVE 07/26/2017 0950   PROTEINUR 30 (A) 07/26/2017 0950   UROBILINOGEN 1.0 12/10/2014 1225   NITRITE NEGATIVE 07/26/2017 0950   LEUKOCYTESUR TRACE (A) 07/26/2017 0950   Sepsis Labs: @LABRCNTIP (procalcitonin:4,lacticidven:4)  ) Recent Results (from the past 240 hour(s))  SARS CORONAVIRUS 2 (TAT 6-24 HRS) Nasopharyngeal Nasopharyngeal Swab     Status: None   Collection Time: 06/21/20 11:24 PM   Specimen: Nasopharyngeal Swab  Result Value Ref Range Status   SARS Coronavirus 2 NEGATIVE NEGATIVE Final    Comment: (NOTE) SARS-CoV-2 target nucleic acids are NOT DETECTED.  The SARS-CoV-2 RNA is generally detectable in upper  and lower respiratory specimens during the acute phase of infection. Negative results do not preclude SARS-CoV-2 infection, do not rule out co-infections with other pathogens, and should not be used as the sole basis for treatment or other patient management decisions. Negative results must be combined with clinical observations, patient history, and epidemiological information. The expected result is Negative.  Fact Sheet for Patients: HairSlick.nohttps://www.fda.gov/media/138098/download  Fact Sheet for Healthcare Providers: quierodirigir.comhttps://www.fda.gov/media/138095/download  This test is not yet approved or cleared by the [place] FDA and  has been authorized for detection and/or diagnosis of SARS-CoV-2 by FDA under an Emergency Use Authorization (EUA). This EUA will remain  in effect (meaning this test can be used) for the duration of the COVID-19 declaration under Se ction 564(b)(1) of the Act, 21 U.S.C. section 360bbb-3(b)(1), unless the authorization is terminated or revoked sooner.  Performed at [facility], [address] [place], [place] [address]       Radiology  Studies: ECHOCARDIOGRAM COMPLETE  Result Date: 06/22/2020    ECHOCARDIOGRAM REPORT   Patient [facility]   [facility] Date of Exam: 06/22/2020 Medical Rec #:  [id number]      Height:       61.0 in Accession #:    [id number]     Weight:       118.0 lb Date of Birth:  03/27/1932       BSA:          1.509 m Patient Age:    87 years       BP:           124/86 mmHg Patient Gender: F              HR:           82 bpm. Exam Location:  Inpatient Procedure: 2D Echo, Cardiac Doppler and Color Doppler Indications:    CHF  History:        Patient has prior history of Echocardiogram examinations, most                 recent 07/28/2017. Previous Myocardial Infarction, Stroke; Risk                 Factors:Hypertension.  Sonographer:    Shirlean KellyJohn Mendel Brown Referring Phys: [phone] JARED M GARDNER IMPRESSIONS  1. Left ventricular ejection fraction, by estimation, is 60 to 65%. The left ventricle has normal function. The left ventricle has no regional wall motion abnormalities. There is moderate concentric left ventricular hypertrophy. Left ventricular diastolic function could not be evaluated.  2. Right ventricular systolic function is normal. The right ventricular size is normal.  3. The mitral valve is degenerative. Mild mitral valve regurgitation. No evidence of mitral stenosis. Moderate to severe mitral annular calcification.  4. The aortic valve is tricuspid. There is mild calcification of the aortic valve. Aortic valve regurgitation is mild. No aortic stenosis is present. FINDINGS  Left Ventricle: Left ventricular ejection fraction, by estimation, is 60 to 65%. The left ventricle has normal function. The left ventricle has no regional wall motion abnormalities. The left ventricular internal cavity size was normal in size. There is  moderate concentric left ventricular hypertrophy. Left ventricular diastolic function could not be evaluated due to mitral annular calcification (moderate or greater). Left ventricular diastolic function  could not be evaluated. Right Ventricle: The right ventricular size is normal. No increase in right ventricular wall thickness. Right ventricular systolic function is normal. Left Atrium: Left atrial size was normal in  size. Right Atrium: Right atrial size was normal in size. Pericardium: Trivial pericardial effusion is present. Presence of pericardial fat pad. Mitral Valve: The mitral valve is degenerative in appearance. Moderate to severe mitral annular calcification. Mild mitral valve regurgitation. No evidence of mitral valve stenosis. Tricuspid Valve: The tricuspid valve is grossly normal. Tricuspid valve regurgitation is trivial. No evidence of tricuspid stenosis. Aortic Valve: The aortic valve is tricuspid. There is mild calcification of the aortic valve. Aortic valve regurgitation is mild. Aortic regurgitation PHT measures 488 msec. No aortic stenosis is present. Aortic valve mean gradient measures 8.0 mmHg. Aortic valve peak gradient measures 13.8 mmHg. Aortic valve area, by VTI measures 1.91 cm. Pulmonic Valve: The pulmonic valve was grossly normal. Pulmonic valve regurgitation is not visualized. No evidence of pulmonic stenosis. Aorta: The aortic root and ascending aorta are structurally normal, with no evidence of dilitation. Venous: The inferior vena cava was not well visualized. IAS/Shunts: The atrial septum is grossly normal. Additional Comments: There is a small pleural effusion in the left lateral region.  LEFT VENTRICLE PLAX 2D LVIDd:         3.50 cm     Diastology LVIDs:         2.20 cm     LV e' medial:    4.13 cm/s LV PW:         1.60 cm     LV E/e' medial:  25.2 LV IVS:        1.40 cm     LV e' lateral:   5.98 cm/s LVOT diam:     2.00 cm     LV E/e' lateral: 17.4 LV SV:         69 LV SV Index:   46 LVOT Area:     3.14 cm  LV Volumes (MOD) LV vol d, MOD A2C: 60.8 ml LV vol d, MOD A4C: 61.4 ml LV vol s, MOD A2C: 20.3 ml LV vol s, MOD A4C: 19.8 ml LV SV MOD A2C:     40.5 ml LV SV MOD A4C:      61.4 ml LV SV MOD BP:      42.2 ml RIGHT VENTRICLE RV Basal diam:  3.30 cm RV S prime:     13.30 cm/s TAPSE (M-mode): 2.5 cm LEFT ATRIUM             Index       RIGHT ATRIUM           Index LA diam:        3.80 cm 2.52 cm/m  RA Area:     12.70 cm LA Vol (A2C):   56.5 ml 37.43 ml/m RA Volume:   27.10 ml  17.95 ml/m LA Vol (A4C):   41.6 ml 27.56 ml/m LA Biplane Vol: 50.4 ml 33.39 ml/m  AORTIC VALVE AV Area (Vmax):    1.91 cm AV Area (Vmean):   1.91 cm AV Area (VTI):     1.91 cm AV Vmax:           186.00 cm/s AV Vmean:          132.000 cm/s AV VTI:            0.360 m AV Peak Grad:      13.8 mmHg AV Mean Grad:      8.0 mmHg LVOT Vmax:         113.00 cm/s LVOT Vmean:        80.100 cm/s LVOT VTI:  0.219 m LVOT/AV VTI ratio: 0.61 AI PHT:            488 msec  AORTA Ao Root diam: 3.20 cm MITRAL VALVE                TRICUSPID VALVE MV Area (PHT): 3.21 cm     TR Peak grad:   17.3 mmHg MV Decel Time: 236 msec     TR Vmax:        208.00 cm/s MV E velocity: 104.00 cm/s MV A velocity: 134.00 cm/s  SHUNTS MV E/A ratio:  0.78         Systemic VTI:  0.22 m                             Systemic Diam: 2.00 cm Lennie Odor MD Electronically signed by Lennie Odor MD Signature Date/Time: 06/22/2020/3:59:06 PM    Final    VAS [place] LOWER EXTREMITY VENOUS (DVT)  Result Date: 06/23/2020  Lower Venous DVT Study Patient Name:  YASHIRA OFFENBERGER [facility]  Date of Exam:   06/23/2020 Medical Rec #: [id number]       Accession #:    [id number] Date of Birth: 01-10-1933        Patient Gender: F Patient Age:   21Y Exam Location:  [facility] Procedure:      VAS [place] LOWER EXTREMITY VENOUS (DVT) Referring Phys: [id number] [facility] Alayne Estrella --------------------------------------------------------------------------------  Indications: Swelling, and SOB.  Limitations: Poor ultrasound/tissue interface. Comparison Study: No prior studies. Performing Technologist: Jean Rosenthal RDMS,RVT  Examination Guidelines: A complete evaluation includes B-mode  imaging, spectral Doppler, color Doppler, and power Doppler as needed of all accessible portions of each vessel. Bilateral testing is considered an integral part of a complete examination. Limited examinations for reoccurring indications may be performed as noted. The reflux portion of the exam is performed with the patient in reverse Trendelenburg.  +---------+---------------+---------+-----------+----------+-------------------+ RIGHT    CompressibilityPhasicitySpontaneityPropertiesThrombus Aging      +---------+---------------+---------+-----------+----------+-------------------+ CFV      Full           Yes      Yes                                      +---------+---------------+---------+-----------+----------+-------------------+ SFJ      Full                                                             +---------+---------------+---------+-----------+----------+-------------------+ FV Prox  Full                                                             +---------+---------------+---------+-----------+----------+-------------------+ FV Mid   Full                                                             +---------+---------------+---------+-----------+----------+-------------------+  FV DistalFull                                                             +---------+---------------+---------+-----------+----------+-------------------+ PFV      Full                                                             +---------+---------------+---------+-----------+----------+-------------------+ POP      Full           Yes      Yes                                      +---------+---------------+---------+-----------+----------+-------------------+ PTV      Full                                                             +---------+---------------+---------+-----------+----------+-------------------+ PERO                    Yes      Yes                   Not well visualized +---------+---------------+---------+-----------+----------+-------------------+   +---------+---------------+---------+-----------+----------+-------------------+ LEFT     CompressibilityPhasicitySpontaneityPropertiesThrombus Aging      +---------+---------------+---------+-----------+----------+-------------------+ CFV      Full           Yes      Yes                                      +---------+---------------+---------+-----------+----------+-------------------+ SFJ      Full                                                             +---------+---------------+---------+-----------+----------+-------------------+ FV Prox  Full                                                             +---------+---------------+---------+-----------+----------+-------------------+ FV Mid   Full                                                             +---------+---------------+---------+-----------+----------+-------------------+ FV DistalFull                                                             +---------+---------------+---------+-----------+----------+-------------------+  PFV      Full                                                             +---------+---------------+---------+-----------+----------+-------------------+ POP      Full           Yes      Yes                                      +---------+---------------+---------+-----------+----------+-------------------+ PTV                     Yes      Yes                  Not well visualized +---------+---------------+---------+-----------+----------+-------------------+ PERO                    Yes      Yes                  Not well visualized +---------+---------------+---------+-----------+----------+-------------------+    Summary: RIGHT: - There is no evidence of deep vein thrombosis in the lower extremity. However, portions of this examination were limited-  see technologist comments above.  - A cystic structure is found in the popliteal fossa. - Ultrasound characteristics of enlarged lymph nodes are noted in the groin.  LEFT: - There is no evidence of deep vein thrombosis in the lower extremity. However, portions of this examination were limited- see technologist comments above.  - A cystic structure is found in the popliteal fossa. - Ultrasound characteristics of enlarged lymph nodes noted in the groin.  *See table(s) above for measurements and observations.    Preliminary      Scheduled Meds: . aspirin EC  81 mg Oral Daily  . atorvastatin  40 mg Oral q1800  . carvedilol  3.125 mg Oral BID WC  . enoxaparin (LOVENOX) injection  40 mg Subcutaneous Q24H  . furosemide  40 mg Intravenous Daily  . lisinopril  20 mg Oral Daily   And  . hydrochlorothiazide  12.5 mg Oral Daily  . sodium chloride flush  3 mL Intravenous Q12H   Continuous Infusions: . sodium chloride       LOS: 3 days   Time Spent in minutes   30 minutes  Nelson Noone D.O. on 06/24/2020 at 9:58 AM  Between 7am to 7pm - Please see pager noted on [email or web address]  After 7pm go to [email or web address]  And look for the night coverage person covering for me after hours  [facility] Office  [phone]

## 2020-06-24 NOTE — Progress Notes (Signed)
Progress Note  Patient Name: Erin Patterson Date of Encounter: 06/24/2020  [facility] Cardiologist: Jodelle RedBridgette Christopher, MD   Subjective   Still tired but lower extremity edema is improved but not at baseline.  Speaks Cambodian  Inpatient Medications    Scheduled Meds: . aspirin EC  81 mg Oral Daily  . atorvastatin  40 mg Oral q1800  . carvedilol  3.125 mg Oral BID WC  . enoxaparin (LOVENOX) injection  40 mg Subcutaneous Q24H  . furosemide  40 mg Intravenous Daily  . lisinopril  20 mg Oral Daily   And  . hydrochlorothiazide  12.5 mg Oral Daily  . sodium chloride flush  3 mL Intravenous Q12H   Continuous Infusions: . sodium chloride     PRN Meds: sodium chloride, acetaminophen, ondansetron (ZOFRAN) IV, sodium chloride flush   Vital Signs    Vitals:   06/23/20 1657 06/23/20 2003 06/23/20 2338 06/24/20 0341  BP: 127/76 (!) 113/59 120/68 (!) 112/59  Pulse: 82 77 76 75  Resp:  18 18 18   Temp: 98.5 F (36.9 C) 98.4 F (36.9 C) 98.4 F (36.9 C) 98.3 F (36.8 C)  TempSrc: Oral Oral Oral Oral  SpO2: 98% 95% 96% 94%  Weight:    62.9 kg    Intake/Output Summary (Last 24 hours) at 06/24/2020 1102 Last data filed at 06/24/2020 0829 Gross per 24 hour  Intake 480 ml  Output 801 ml  Net -321 ml   Last 3 Weights 06/24/2020 06/23/2020 06/22/2020  Weight (lbs) 138 lb 11.2 oz 139 lb 8.8 oz 143 lb 4.8 oz  Weight (kg) 62.914 kg 63.3 kg 65 kg      Telemetry    No adverse arrhythmias- Personally Reviewed  ECG    No new- Personally Reviewed  Physical Exam   GEN: No acute distress.   Neck: No JVD Cardiac: RRR, no murmurs, rubs, or gallops.  Respiratory: Clear to auscultation bilaterally. GI: Soft, nontender, non-distended  MS:  2+ lower extremity chronic appearing edema; No deformity. Neuro:  Nonfocal  Psych: Normal affect   Labs    High Sensitivity Troponin:  No results for input(s): TROPONINIHS in the last 720 hours.    Chemistry Recent Labs  Lab  06/21/20 1616 06/22/20 0135 06/23/20 0416 06/24/20 0313 06/24/20 0656  NA 133* 132* 131* 131*  --   K 4.2 3.5 3.2* 3.4*  --   CL 99 99 95* 95*  --   CO2 29 27 29 28   --   GLUCOSE 96 91 84 86  --   BUN 11 8 18  24*  --   CREATININE 0.73 0.69 0.90 0.89  --   CALCIUM 8.1* 7.8* 7.9* 7.5*  --   PROT 6.7  --   --   --  5.5*  ALBUMIN 2.3*  --   --   --  1.9*  AST 128*  --   --   --  87*  ALT 63*  --   --   --  46*  ALKPHOS 93  --   --   --  64  BILITOT 1.0  --   --   --  0.8  GFRNONAA >60 >60 >60 >60  --   ANIONGAP 5 6 7 8   --      Hematology Recent Labs  Lab 06/21/20 1616 06/23/20 0416  WBC 6.6 6.1  RBC 4.15 3.75*  HGB 11.2* 10.2*  HCT 32.0* 27.9*  MCV 77.1* 74.4*  MCH 27.0 27.2  MCHC  35.0 36.6*  RDW 17.4* 16.6*  PLT 223 164    BNP Recent Labs  Lab 06/21/20 1616  BNP 550.4*     DDimer No results for input(s): DDIMER in the last 168 hours.   Radiology    ECHOCARDIOGRAM COMPLETE  Result Date: 06/22/2020    ECHOCARDIOGRAM REPORT   Patient Name:   Erin Patterson [facility] Date of Exam: 06/22/2020 Medical Rec #:  [id number]      Height:       61.0 in Accession #:    [id number]     Weight:       118.0 lb Date of Birth:  10-21-1932       BSA:          1.509 m Patient Age:    87 years       BP:           124/86 mmHg Patient Gender: F              HR:           82 bpm. Exam Location:  Inpatient Procedure: 2D Echo, Cardiac Doppler and Color Doppler Indications:    CHF  History:        Patient has prior history of Echocardiogram examinations, most                 recent 07/28/2017. Previous Myocardial Infarction, Stroke; Risk                 Factors:Hypertension.  Sonographer:    Shirlean Kelly Referring Phys: [phone] JARED M GARDNER IMPRESSIONS  1. Left ventricular ejection fraction, by estimation, is 60 to 65%. The left ventricle has normal function. The left ventricle has no regional wall motion abnormalities. There is moderate concentric left ventricular hypertrophy. Left ventricular  diastolic function could not be evaluated.  2. Right ventricular systolic function is normal. The right ventricular size is normal.  3. The mitral valve is degenerative. Mild mitral valve regurgitation. No evidence of mitral stenosis. Moderate to severe mitral annular calcification.  4. The aortic valve is tricuspid. There is mild calcification of the aortic valve. Aortic valve regurgitation is mild. No aortic stenosis is present. FINDINGS  Left Ventricle: Left ventricular ejection fraction, by estimation, is 60 to 65%. The left ventricle has normal function. The left ventricle has no regional wall motion abnormalities. The left ventricular internal cavity size was normal in size. There is  moderate concentric left ventricular hypertrophy. Left ventricular diastolic function could not be evaluated due to mitral annular calcification (moderate or greater). Left ventricular diastolic function could not be evaluated. Right Ventricle: The right ventricular size is normal. No increase in right ventricular wall thickness. Right ventricular systolic function is normal. Left Atrium: Left atrial size was normal in size. Right Atrium: Right atrial size was normal in size. Pericardium: Trivial pericardial effusion is present. Presence of pericardial fat pad. Mitral Valve: The mitral valve is degenerative in appearance. Moderate to severe mitral annular calcification. Mild mitral valve regurgitation. No evidence of mitral valve stenosis. Tricuspid Valve: The tricuspid valve is grossly normal. Tricuspid valve regurgitation is trivial. No evidence of tricuspid stenosis. Aortic Valve: The aortic valve is tricuspid. There is mild calcification of the aortic valve. Aortic valve regurgitation is mild. Aortic regurgitation PHT measures 488 msec. No aortic stenosis is present. Aortic valve mean gradient measures 8.0 mmHg. Aortic valve peak gradient measures 13.8 mmHg. Aortic valve area, by VTI measures 1.91 cm. Pulmonic Valve: The  pulmonic valve  was grossly normal. Pulmonic valve regurgitation is not visualized. No evidence of pulmonic stenosis. Aorta: The aortic root and ascending aorta are structurally normal, with no evidence of dilitation. Venous: The inferior vena cava was not well visualized. IAS/Shunts: The atrial septum is grossly normal. Additional Comments: There is a small pleural effusion in the left lateral region.  LEFT VENTRICLE PLAX 2D LVIDd:         3.50 cm     Diastology LVIDs:         2.20 cm     LV e' medial:    4.13 cm/s LV PW:         1.60 cm     LV E/e' medial:  25.2 LV IVS:        1.40 cm     LV e' lateral:   5.98 cm/s LVOT diam:     2.00 cm     LV E/e' lateral: 17.4 LV SV:         69 LV SV Index:   46 LVOT Area:     3.14 cm  LV Volumes (MOD) LV vol d, MOD A2C: 60.8 ml LV vol d, MOD A4C: 61.4 ml LV vol s, MOD A2C: 20.3 ml LV vol s, MOD A4C: 19.8 ml LV SV MOD A2C:     40.5 ml LV SV MOD A4C:     61.4 ml LV SV MOD BP:      42.2 ml RIGHT VENTRICLE RV Basal diam:  3.30 cm RV S prime:     13.30 cm/s TAPSE (M-mode): 2.5 cm LEFT ATRIUM             Index       RIGHT ATRIUM           Index LA diam:        3.80 cm 2.52 cm/m  RA Area:     12.70 cm LA Vol (A2C):   56.5 ml 37.43 ml/m RA Volume:   27.10 ml  17.95 ml/m LA Vol (A4C):   41.6 ml 27.56 ml/m LA Biplane Vol: 50.4 ml 33.39 ml/m  AORTIC VALVE AV Area (Vmax):    1.91 cm AV Area (Vmean):   1.91 cm AV Area (VTI):     1.91 cm AV Vmax:           186.00 cm/s AV Vmean:          132.000 cm/s AV VTI:            0.360 m AV Peak Grad:      13.8 mmHg AV Mean Grad:      8.0 mmHg LVOT Vmax:         113.00 cm/s LVOT Vmean:        80.100 cm/s LVOT VTI:          0.219 m LVOT/AV VTI ratio: 0.61 AI PHT:            488 msec  AORTA Ao Root diam: 3.20 cm MITRAL VALVE                TRICUSPID VALVE MV Area (PHT): 3.21 cm     TR Peak grad:   17.3 mmHg MV Decel Time: 236 msec     TR Vmax:        208.00 cm/s MV E velocity: 104.00 cm/s MV A velocity: 134.00 cm/s  SHUNTS MV E/A ratio:  0.78          Systemic VTI:  0.22 m  Systemic Diam: 2.00 cm Lennie Odor MD Electronically signed by Lennie Odor MD Signature Date/Time: 06/22/2020/3:59:06 PM    Final    VAS [place] LOWER EXTREMITY VENOUS (DVT)  Result Date: 06/23/2020  Lower Venous DVT Study Patient Name:  Erin Patterson [facility]  Date of Exam:   06/23/2020 Medical Rec #: [id number]       Accession #:    [id number] Date of Birth: 15-May-1932        Patient Gender: F Patient Age:   80Y Exam Location:  [facility] Procedure:      VAS [place] LOWER EXTREMITY VENOUS (DVT) Referring Phys: [id number] [facility] MIKHAIL --------------------------------------------------------------------------------  Indications: Swelling, and SOB.  Limitations: Poor ultrasound/tissue interface. Comparison Study: No prior studies. Performing Technologist: Jean Rosenthal RDMS,RVT  Examination Guidelines: A complete evaluation includes B-mode imaging, spectral Doppler, color Doppler, and power Doppler as needed of all accessible portions of each vessel. Bilateral testing is considered an integral part of a complete examination. Limited examinations for reoccurring indications may be performed as noted. The reflux portion of the exam is performed with the patient in reverse Trendelenburg.  +---------+---------------+---------+-----------+----------+-------------------+ RIGHT    CompressibilityPhasicitySpontaneityPropertiesThrombus Aging      +---------+---------------+---------+-----------+----------+-------------------+ CFV      Full           Yes      Yes                                      +---------+---------------+---------+-----------+----------+-------------------+ SFJ      Full                                                             +---------+---------------+---------+-----------+----------+-------------------+ FV Prox  Full                                                              +---------+---------------+---------+-----------+----------+-------------------+ FV Mid   Full                                                             +---------+---------------+---------+-----------+----------+-------------------+ FV DistalFull                                                             +---------+---------------+---------+-----------+----------+-------------------+ PFV      Full                                                             +---------+---------------+---------+-----------+----------+-------------------+ POP  Full           Yes      Yes                                      +---------+---------------+---------+-----------+----------+-------------------+ PTV      Full                                                             +---------+---------------+---------+-----------+----------+-------------------+ PERO                    Yes      Yes                  Not well visualized +---------+---------------+---------+-----------+----------+-------------------+   +---------+---------------+---------+-----------+----------+-------------------+ LEFT     CompressibilityPhasicitySpontaneityPropertiesThrombus Aging      +---------+---------------+---------+-----------+----------+-------------------+ CFV      Full           Yes      Yes                                      +---------+---------------+---------+-----------+----------+-------------------+ SFJ      Full                                                             +---------+---------------+---------+-----------+----------+-------------------+ FV Prox  Full                                                             +---------+---------------+---------+-----------+----------+-------------------+ FV Mid   Full                                                             +---------+---------------+---------+-----------+----------+-------------------+ FV  DistalFull                                                             +---------+---------------+---------+-----------+----------+-------------------+ PFV      Full                                                             +---------+---------------+---------+-----------+----------+-------------------+ POP      Full           Yes  Yes                                      +---------+---------------+---------+-----------+----------+-------------------+ PTV                     Yes      Yes                  Not well visualized +---------+---------------+---------+-----------+----------+-------------------+ PERO                    Yes      Yes                  Not well visualized +---------+---------------+---------+-----------+----------+-------------------+    Summary: RIGHT: - There is no evidence of deep vein thrombosis in the lower extremity. However, portions of this examination were limited- see technologist comments above.  - A cystic structure is found in the popliteal fossa. - Ultrasound characteristics of enlarged lymph nodes are noted in the groin.  LEFT: - There is no evidence of deep vein thrombosis in the lower extremity. However, portions of this examination were limited- see technologist comments above.  - A cystic structure is found in the popliteal fossa. - Ultrasound characteristics of enlarged lymph nodes noted in the groin.  *See table(s) above for measurements and observations.    Preliminary     Cardiac Studies   Echo with normal EF  Patient Profile     87 y.o. female here with acute diastolic heart failure, lower extremity edema weakness fatigue shortness of breath.  Assessment & Plan    Acute diastolic heart failure Essential hypertension Hyperlipidemia History of CVA Hyponatremia Hypokalemia  - BUN increased from 8> 18> 24.  Creatinine remained stable at 0.89. - Seems reasonable to continue with IV Lasix 40 mg, I will increase to  twice daily -This should help hyponatremia theoretically. -I will also stop her HCTZ which could be contributing to the hyponatremia.  As an outpatient, she would likely benefit from low-dose Lasix versus HCTZ -Continue to replete potassium -LFTs improving -TSH normal -Atorvastatin 40 continue.  LFTs are improving.     For questions or updates, please contact [facility] Please consult [email or web address] for contact info under        Signed, Donato Schultz, MD  06/24/2020, 11:02 AM

## 2020-06-25 DIAGNOSIS — I10 Essential (primary) hypertension: Secondary | ICD-10-CM

## 2020-06-25 DIAGNOSIS — E876 Hypokalemia: Secondary | ICD-10-CM

## 2020-06-25 LAB — CBC
HCT: 26.5 % — ABNORMAL LOW (ref 36.0–46.0)
Hemoglobin: 9.5 g/dL — ABNORMAL LOW (ref 12.0–15.0)
MCH: 26.7 pg (ref 26.0–34.0)
MCHC: 35.8 g/dL (ref 30.0–36.0)
MCV: 74.4 fL — ABNORMAL LOW (ref 80.0–100.0)
Platelets: 167 10*3/uL (ref 150–400)
RBC: 3.56 MIL/uL — ABNORMAL LOW (ref 3.87–5.11)
RDW: 16.5 % — ABNORMAL HIGH (ref 11.5–15.5)
WBC: 5.7 10*3/uL (ref 4.0–10.5)
nRBC: 0.4 % — ABNORMAL HIGH (ref 0.0–0.2)

## 2020-06-25 LAB — URINALYSIS, ROUTINE W REFLEX MICROSCOPIC
Bilirubin Urine: NEGATIVE
Glucose, UA: NEGATIVE mg/dL
Hgb urine dipstick: NEGATIVE
Ketones, ur: NEGATIVE mg/dL
Nitrite: NEGATIVE
Protein, ur: NEGATIVE mg/dL
Specific Gravity, Urine: 1.01 (ref 1.005–1.030)
pH: 6.5 (ref 5.0–8.0)

## 2020-06-25 LAB — URINALYSIS, MICROSCOPIC (REFLEX)

## 2020-06-25 LAB — COMPREHENSIVE METABOLIC PANEL
ALT: 42 U/L (ref 0–44)
AST: 78 U/L — ABNORMAL HIGH (ref 15–41)
Albumin: 2 g/dL — ABNORMAL LOW (ref 3.5–5.0)
Alkaline Phosphatase: 63 U/L (ref 38–126)
Anion gap: 7 (ref 5–15)
BUN: 30 mg/dL — ABNORMAL HIGH (ref 8–23)
CO2: 30 mmol/L (ref 22–32)
Calcium: 7.6 mg/dL — ABNORMAL LOW (ref 8.9–10.3)
Chloride: 95 mmol/L — ABNORMAL LOW (ref 98–111)
Creatinine, Ser: 1.03 mg/dL — ABNORMAL HIGH (ref 0.44–1.00)
GFR, Estimated: 53 mL/min — ABNORMAL LOW (ref >60)
Glucose, Bld: 86 mg/dL (ref 70–99)
Potassium: 3.2 mmol/L — ABNORMAL LOW (ref 3.5–5.1)
Sodium: 132 mmol/L — ABNORMAL LOW (ref 135–145)
Total Bilirubin: 0.9 mg/dL (ref 0.3–1.2)
Total Protein: 5.7 g/dL — ABNORMAL LOW (ref 6.5–8.1)

## 2020-06-25 LAB — RETICULOCYTES
Immature Retic Fract: 8.8 % (ref 2.3–15.9)
RBC.: 3.62 MIL/uL — ABNORMAL LOW (ref 3.87–5.11)
Retic Count, Absolute: 35.8 10*3/uL (ref 19.0–186.0)
Retic Ct Pct: 1 % (ref 0.4–3.1)

## 2020-06-25 LAB — IRON AND TIBC
Iron: 44 ug/dL (ref 28–170)
Saturation Ratios: 24 % (ref 10.4–31.8)
TIBC: 186 ug/dL — ABNORMAL LOW (ref 250–450)
UIBC: 142 ug/dL

## 2020-06-25 LAB — FOLATE: Folate: 13.7 ng/mL (ref >5.9)

## 2020-06-25 LAB — FERRITIN: Ferritin: 88 ng/mL (ref 11–307)

## 2020-06-25 LAB — MAGNESIUM: Magnesium: 1.9 mg/dL (ref 1.7–2.4)

## 2020-06-25 MED ORDER — IOHEXOL 9 MG/ML PO SOLN
ORAL | Status: AC
  Administered 2020-06-25: 500 mL
  Filled 2020-06-25: qty 1000

## 2020-06-25 MED ORDER — POTASSIUM CHLORIDE CRYS ER 20 MEQ PO TBCR
60.0000 meq | EXTENDED_RELEASE_TABLET | Freq: Once | ORAL | Status: AC
  Administered 2020-06-25: 60 meq via ORAL
  Filled 2020-06-25: qty 3

## 2020-06-25 NOTE — Evaluation (Signed)
Physical Therapy Evaluation Patient Details Name: Erin Patterson MRN: [id number] DOB: 24-Jun-1932 Today's Date: 06/25/2020   History of Present Illness  Patient is a 87 y/o female who presents on 06/21/20 with BLE edema, fatigue and SOB. Found to have new onset CHF. CXR- mild CHF, vascular congestion and pleural effusions.  PMH includes HTN, CAD, MI, CVA.  Clinical Impression  Patient presents with fatigue and decreased endurance s/p above. Pt lives at home with spouse and reports being independent for ADLs/IADLs and walking PTA. Today, pt tolerated gait training with use of RW for support to help with energy conservation and endurance. VSS on RA throughout. Attempted to use ipad interpreter but pt very HOH so had a hard time understanding some things at times. Discussed importance of mobility and short bouts of activity with longer rest breaks. Recommend RW to assist with activity tolerance, energy conservation and endurance. Will follow acutely to maximize independence and mobility prior to return home.    Follow Up Recommendations No PT follow up;Supervision - Intermittent    Equipment Recommendations  Rolling walker with 5" wheels    Recommendations for Other Services       Precautions / Restrictions Precautions Precautions: Fall Restrictions Weight Bearing Restrictions: No      Mobility  Bed Mobility Overal bed mobility: Modified Independent             General bed mobility comments: No assist needed.    Transfers Overall transfer level: Modified independent Equipment used: None             General transfer comment: Stood from EOB x2, from toilet x1, no difficulties.  Ambulation/Gait Ambulation/Gait assistance: Modified independent (Device/Increase time) Gait Distance (Feet): 150 Feet Assistive device: Rolling walker (2 wheeled);None Gait Pattern/deviations: Step-through pattern;Decreased stride length;Trunk flexed   Gait velocity interpretation: <1.31 ft/sec,  indicative of household ambulator General Gait Details: Slow, mostly steady gait with RW for support halfway and then no UE support for rest of walk. VSS on RA. Reports feeling tired.  Stairs Stairs:  (declined today)          Engineer, drilling Rankin (Stroke Patients Only)       Balance Overall balance assessment: No apparent balance deficits (not formally assessed)                                           Pertinent Vitals/Pain Pain Assessment: No/denies pain    Home Living Family/patient expects to be discharged to:: Private residence Living Arrangements: Spouse/significant other Available Help at Discharge: Family;Available 24 hours/day Type of Home: Apartment Home Access: Stairs to enter Entrance Stairs-Rails: Right Entrance Stairs-Number of Steps: 10 Home Layout: One level Home Equipment: Cane - single point      Prior Function Level of Independence: Independent         Comments: Does own ADLs, IADLs, drives.     Hand Dominance   Dominant Hand: Right    Extremity/Trunk Assessment   Upper Extremity Assessment Upper Extremity Assessment: Defer to OT evaluation    Lower Extremity Assessment Lower Extremity Assessment: Generalized weakness       Communication   Communication: Prefers language other than English  Cognition Arousal/Alertness: Awake/alert Behavior During Therapy: WFL for tasks assessed/performed Overall Cognitive Status: Difficult to assess  General Comments: Pt very HOH and only able to use translator via phone as there is no video for her language.      General Comments General comments (skin integrity, edema, etc.): Spouse present during session. Pt with hard time using video strata ipad as pt very HOH.    Exercises     Assessment/Plan    PT Assessment Patient needs continued PT services  PT Problem List Decreased strength;Decreased  mobility;Decreased activity tolerance;Cardiopulmonary status limiting activity;Decreased knowledge of use of DME       PT Treatment Interventions Patient/family education;Therapeutic activities;Functional mobility training;Stair training;Gait training;DME instruction;Therapeutic exercise    PT Goals (Current goals can be found in the Care Plan section)  Acute Rehab PT Goals Patient Stated Goal: to not be as tired PT Goal Formulation: With patient Time For Goal Achievement: 07/09/20 Potential to Achieve Goals: Good    Frequency Min 3X/week   Barriers to discharge Inaccessible home environment stairs    Co-evaluation               AM-PAC PT "6 Clicks" Mobility  Outcome Measure Help needed turning from your back to your side while in a flat bed without using bedrails?: None Help needed moving from lying on your back to sitting on the side of a flat bed without using bedrails?: None Help needed moving to and from a bed to a chair (including a wheelchair)?: None Help needed standing up from a chair using your arms (e.g., wheelchair or bedside chair)?: None Help needed to walk in hospital room?: A Little Help needed climbing 3-5 steps with a railing? : A Little 6 Click Score: 22    End of Session Equipment Utilized During Treatment: Gait belt Activity Tolerance: Patient tolerated treatment well Patient left: in bed;with call bell/phone within reach Nurse Communication: Mobility status PT Visit Diagnosis: Difficulty in walking, not elsewhere classified (R26.2)    Time: 6962-9528 PT Time Calculation (min) (ACUTE ONLY): 27 min   Charges:   PT Evaluation $PT Eval Moderate Complexity: 1 Mod PT Treatments $Gait Training: 8-22 mins        Erin Patterson, PT, DPT Acute Rehabilitation Services Pager [phone] Office [phone]      Erin Patterson 06/25/2020, 2:14 PM

## 2020-06-25 NOTE — Progress Notes (Signed)
Using translator, plan of care for tonight explained to patient, spouse at bedside.  Second bottle of oral contrast given for prep for CT abdomen and pelvis.  Patient requested to call for assistance before getting up due to feeling weak for her safety.  Explained to patient that her intake and output need to be measured, requested she does not flush urine until staff has seen it.  Advised patient we need a stool specimen, requested she does not flush stool or put toilet tissue in specimen container in toilet.  Night time medication schedule and rounding explained to patient.  Patient denies pain and shortness of breath when asked.  Advised patient if she has questions or needs later that the interpretor service could be used.  Patient has no questions at this time.

## 2020-06-25 NOTE — Discharge Summary (Signed)
Physician Discharge Summary  Erin Patterson NBV:[id number] DOB: [birth year] DOA: 06/21/2020  PCP: Jackie Plum, MD  Admit date: 06/21/2020 Discharge date: 06/27/2020  Time spent: 45 minutes  Recommendations for Outpatient Follow-up:  Patient will be discharged to home.  Patient will need to follow up with primary care provider within one week of discharge, repeat CBC and CMP.  Follow up with cardiology. Follow up with gynecology for pelvic sonogram. Patient should continue medications as prescribed.  Patient should follow a heart healthy diet.   Discharge Diagnoses:  Lower extremity edema/anasarca, New onset diastolic CHF Fatigue Essential hypertension History of CVA Mild hyponatremia Hypokalemia/hypomagnesemia Mildly elevated LFTs AKI Enlarged lymph nodes  Discharge Condition: Stable  Diet recommendation: heart healthy  Filed Weights   06/25/20 0437 06/26/20 0511 06/27/20 0447  Weight: 61.1 kg 61.2 kg 60 kg    History of present illness:  06/21/2020 by Dr. Lyda Perone Jannessa Thi Thachis a 87 y.o.femalewith medical history significant ofHTN, HLD, prior stroke and MI.  Pt presents to ED with c/o increased BLE edema and fatigue. Symptoms onset about 1 month ago. Worsening, persistent, now severe. Associated SOB that is worse when she lays down flat.  No CP. No cough, no fevers nor chills.  Hospital Course:  Lower extremity edema/anasarca, New onset diastolic CHF -Patient with no history of CHF -Last echocardiogram/TEE done 07/30/2017 which shows an EF of 55 to 60%, diffuse hypokinesis -Patient presented with lower extremity swelling as well as fatigue. -BNP 550.4 -Chest x-ray showed mild CHF, vascular congestion and small pleural effusions -Continue IV Lasix 40 mg daily -Echocardiogram this admission showed an EF of 60 to 65% no regional wall motion abnormalities.  LVH.  LV diastolic function could not be evaluated.  RV systolic function normal.  Mitral valve is  degenerative.  Mild mitral valve regurgitation.  Moderate to severe mitral annular calcification -Monitor intake and output, daily weights-weight down approximately 2 kg since admission -Lower extremity Doppler unremarkable for DVT.  Enlarged lymph nodes in the groin -Cardiology consulted and appreciated feels lower extremity edema may be chronic, recommended lisinopril and Lasix on discharge. -Patient continued to feel short of breath on examination, chest x-ray obtained and shows improvement in CHF findings however questionable pneumonia- however, procalcitonin unremarkable and patient currently afebrile -Lung sounds are clear on examination -CT chest unremarkable for infection  Fatigue -Patient continues to complain of feeling weak and tired especially with walking -Multifactorial including CHF exacerbation versus anemia versus other etiology given enlargement of lymph nodes -Suspect secondary to the above -TSH 1.575, vitamin B12 level 867, vitamin D level (25-hydroxy) 31.12 -CT abdomen pelvis showed mild to moderate anasarca without substantial ascites.  Left adnexal soft tissue fullness with small cystic area of the left adnexa as well.  Suggest follow-up pelvic sonogram for further evaluation. No signs of significant adenopathy in the chest, abdomen or pelvis -Discussed with OB/Gyn via phone, outpatient follow up recommended along with [place]. Obtain CA125.  -PCP to follow up on results of CA125 and refer patient accordingly -PT recommended rolling walker, no therapy follow up needed  Acute kidney injury -Resolved -Creatinine peaked to 1.17, down to 0.99 which is baseline -Suspect secondary to IV diuresis  -Repeat BMP in 1 week  Essential hypertension -Continue lisinopril, Lasix  History of CVA -Continue statin, aspirin  Mild hyponatremia -Likely secondary to hypervolemia versus diuretics -Sodium up to 134 -Repeat as an outpatient  Microcytic anemia -Hemoglobin stable,  currently 10.1 -Anemia panel showed adequate iron and storage -Repeat CBC  in 1 week  Hypokalemia/hypomagnesemia -Likely secondary to diuresis  -Given replacement -Repeat in 1 week  Mildly elevated LFTs -trending downward -Repeat in 1 week  Ascending thoracic aortic enlargement -CT abd/pelvis showed 4.1 cm ascending thoracic aortic caliber -Recommending surveillance  Enlarged lymph nodes -Noted on ultrasound of the lower extremities -UA obtained, unremarkable for infection -CT chest/abd/pelvis- as above  Consultants Cardiology Gynecology via phone  Procedures  Echocardiogram Lower extremity Doppler  Discharge Exam: Vitals:   06/27/20 0447 06/27/20 0844  BP: (!) 101/52 115/60  Pulse: 74 66  Resp: 18 18  Temp: 98.5 F (36.9 C) 97.9 F (36.6 C)  SpO2: 98% 99%     General: Well developed, chronically ill-appearing, elderly, NAD  HEENT: NCAT, mucous membranes moist.  Cardiovascular: S1 S2 auscultated, RRR, soft SEM  Respiratory: Diminished breath sounds however no wheezing or rhonchi  Abdomen: Soft, nontender, nondistended, + bowel sounds  Extremities: warm dry without cyanosis clubbing.  LE edema bilaterally  Neuro: AAOx3, nonfocal  Psych: appropriate mood and affect  Discharge Instructions Discharge Instructions    (HEART FAILURE PATIENTS) Call MD:  Anytime you have any of the following symptoms: 1) 3 pound weight gain in 24 hours or 5 pounds in 1 week 2) shortness of breath, with or without a dry hacking cough 3) swelling in the hands, feet or stomach 4) if you have to sleep on extra pillows at night in order to breathe.   Complete by: As directed    AMB referral to CHF clinic   Complete by: As directed    Call MD for:  difficulty breathing, headache or visual disturbances   Complete by: As directed    Call MD for:  extreme fatigue   Complete by: As directed    Diet - low sodium heart healthy   Complete by: As directed    Discharge instructions    Complete by: As directed    Patient will be discharged to home.  Patient will need to follow up with primary care provider within one week of discharge, repeat CBC and CMP.  Follow up with cardiology. Follow up with gynecology for pelvic sonogram. Patient should continue medications as prescribed.  Patient should follow a heart healthy diet.   Increase activity slowly   Complete by: As directed      Allergies as of 06/27/2020   No Known Allergies     Medication List    STOP taking these medications   lisinopril-hydrochlorothiazide 20-12.5 MG tablet Commonly known as: ZESTORETIC     TAKE these medications   aspirin 81 MG EC tablet Take 1 tablet (81 mg total) by mouth daily.   atorvastatin 40 MG tablet Commonly known as: LIPITOR Take 1 tablet (40 mg total) by mouth daily at 6 PM.   carvedilol 3.125 MG tablet Commonly known as: COREG Take 1 tablet (3.125 mg total) by mouth 2 (two) times daily with a meal.   empagliflozin 10 MG Tabs tablet Commonly known as: Jardiance Take 1 tablet (10 mg total) by mouth daily before breakfast.   furosemide 20 MG tablet Commonly known as: Lasix Take 1 tablet (20 mg total) by mouth daily.   lisinopril 20 MG tablet Commonly known as: ZESTRIL Take 1 tablet (20 mg total) by mouth daily. Start taking on: Jun 28, 2020      No Known Allergies  Follow-up Information    Osei-Bonsu, Greggory Stallion, MD. Schedule an appointment as soon as possible for a visit.   Specialty: Internal Medicine Why: [facility]  follow-up, repeat labs, follow-up on Ca1 25, and possible referral to gynecology Contact information: [address] [place] [address] [phone]        Jodelle Red, MD .   Specialty: Cardiology Contact information: [address] [place] [address] [phone]                The results of significant diagnostics from this hospitalization (including imaging, microbiology, ancillary and laboratory) are  listed below for reference.    Significant Diagnostic Studies: CT ABDOMEN PELVIS WO CONTRAST  Result Date: 06/26/2020 CLINICAL DATA:  Dyspnea, chronic urinary retention in a 87 year old female. EXAM: CT CHEST, ABDOMEN AND PELVIS WITHOUT CONTRAST TECHNIQUE: Multidetector CT imaging of the chest, abdomen and pelvis was performed following the standard protocol without IV contrast. COMPARISON:  CT angiography of the neck. No additional prior imaging is available for comparison. FINDINGS: CT CHEST FINDINGS Cardiovascular: 4.1 cm ascending thoracic aortic caliber. Heart size moderately enlarged without substantial pericardial effusion. Mitral annular calcification. Three-vessel coronary artery calcification greatest in LEFT coronary circulation. Central pulmonary vasculature is normal caliber. Limited assessment of cardiovascular structures given lack of intravenous contrast. Mediastinum/Nodes: Thoracic inlet structures are normal. No axillary lymphadenopathy. No mediastinal adenopathy. No gross hilar lymphadenopathy. Esophagus mildly patulous. Lungs/Pleura: Small bilateral pleural effusions. Airways are patent. No consolidation aside from minimal volume loss at the lung bases. Musculoskeletal: See below for full musculoskeletal details. CT ABDOMEN PELVIS FINDINGS Hepatobiliary: Normal noncontrast appearance of liver and gallbladder. Pancreas: Normal pancreatic contour, no gross ductal dilation or sign of inflammation. Spleen: Normal size spleen. Small calcification in the inferior spleen. Adrenals/Urinary Tract: Unremarkable appearance of adrenal glands. No hydronephrosis with mild fullness of bilateral extrarenal pelves. The no significant perinephric stranding. No visible ureteral calculi. Ureteral assessment limited by paucity of retroperitoneal fat. Urinary bladder with smooth contours. Stomach/Bowel: Query mild gastric thickening versus under distension. No sign of bowel obstruction. Generalized mild mesenteric  edema in the setting of anasarca. Appendix not visualized. No secondary signs to suggest acute appendicitis. Vascular/Lymphatic: Calcified atheromatous plaque in the nonaneurysmal abdominal aorta. There is no gastrohepatic or hepatoduodenal ligament lymphadenopathy. No retroperitoneal or mesenteric lymphadenopathy. No pelvic sidewall lymphadenopathy. Reproductive: Calcifications about the uterus favored to represent parametrial vascular calcifications. Fullness of the LEFT adnexa with a cystic area in the LEFT adnexa measuring as much is 2.6 x 2.0 cm. LEFT adnexa with fullness aside from this area measuring as much as 4.7 x 3.0 cm. Other: Trace fluid in the abdomen. No free air. Moderate body wall edema. Musculoskeletal: No acute musculoskeletal process. Spinal degenerative changes. IMPRESSION: 1. Mild-to-moderate anasarca a without substantial ascites. 2. No signs of significant adenopathy in the chest, abdomen or pelvis. 3. LEFT adnexal soft tissue fullness with small cystic area of the LEFT adnexa as well. Suggest follow-up pelvic sonogram for further evaluation. 4. Query mild gastric thickening versus under distension. Correlate with any symptoms of gastritis. 5. Three-vessel coronary artery calcification greatest in LEFT coronary circulation. 6. 4.1 cm ascending thoracic aortic caliber. Could consider annual imaging followup by CTA or MRA. This recommendation follows 2010 ACCF/AHA/AATS/ACR/ASA/SCA/SCAI/SIR/STS/SVM Guidelines for the Diagnosis and Management of Patients with Thoracic Aortic Disease. Circulation. 2010; 121: V784-O962. Aortic aneurysm NOS (ICD10-I71.9) 7. Aortic atherosclerosis. Aortic Atherosclerosis (ICD10-I70.0). Electronically Signed   By: Donzetta Kohut M.D.   On: 06/26/2020 12:38   DG Chest 2 View  Result Date: 06/21/2020 CLINICAL DATA:  Edema.  Bilateral leg swelling. EXAM: CHEST - 2 VIEW COMPARISON:  07/26/2017 FINDINGS: Mild cardiomegaly.  Moderate aortic atherosclerosis. Mitral  annulus calcifications are seen. There are small bilateral pleural effusions and adjacent atelectasis. Fluid tracks in the right minor fissure. Vascular congestion with mild peribronchial thickening. No confluent consolidation. No pneumothorax. No acute osseous abnormalities are seen. IMPRESSION: Mild CHF. Cardiomegaly, vascular congestion, and small pleural effusions. Electronically Signed   By: Narda Rutherford M.D.   On: 06/21/2020 16:55   CT CHEST WO CONTRAST  Result Date: 06/26/2020 CLINICAL DATA:  Dyspnea, chronic urinary retention in a 87 year old female. EXAM: CT CHEST, ABDOMEN AND PELVIS WITHOUT CONTRAST TECHNIQUE: Multidetector CT imaging of the chest, abdomen and pelvis was performed following the standard protocol without IV contrast. COMPARISON:  CT angiography of the neck. No additional prior imaging is available for comparison. FINDINGS: CT CHEST FINDINGS Cardiovascular: 4.1 cm ascending thoracic aortic caliber. Heart size moderately enlarged without substantial pericardial effusion. Mitral annular calcification. Three-vessel coronary artery calcification greatest in LEFT coronary circulation. Central pulmonary vasculature is normal caliber. Limited assessment of cardiovascular structures given lack of intravenous contrast. Mediastinum/Nodes: Thoracic inlet structures are normal. No axillary lymphadenopathy. No mediastinal adenopathy. No gross hilar lymphadenopathy. Esophagus mildly patulous. Lungs/Pleura: Small bilateral pleural effusions. Airways are patent. No consolidation aside from minimal volume loss at the lung bases. Musculoskeletal: See below for full musculoskeletal details. CT ABDOMEN PELVIS FINDINGS Hepatobiliary: Normal noncontrast appearance of liver and gallbladder. Pancreas: Normal pancreatic contour, no gross ductal dilation or sign of inflammation. Spleen: Normal size spleen. Small calcification in the inferior spleen. Adrenals/Urinary Tract: Unremarkable appearance of adrenal  glands. No hydronephrosis with mild fullness of bilateral extrarenal pelves. The no significant perinephric stranding. No visible ureteral calculi. Ureteral assessment limited by paucity of retroperitoneal fat. Urinary bladder with smooth contours. Stomach/Bowel: Query mild gastric thickening versus under distension. No sign of bowel obstruction. Generalized mild mesenteric edema in the setting of anasarca. Appendix not visualized. No secondary signs to suggest acute appendicitis. Vascular/Lymphatic: Calcified atheromatous plaque in the nonaneurysmal abdominal aorta. There is no gastrohepatic or hepatoduodenal ligament lymphadenopathy. No retroperitoneal or mesenteric lymphadenopathy. No pelvic sidewall lymphadenopathy. Reproductive: Calcifications about the uterus favored to represent parametrial vascular calcifications. Fullness of the LEFT adnexa with a cystic area in the LEFT adnexa measuring as much is 2.6 x 2.0 cm. LEFT adnexa with fullness aside from this area measuring as much as 4.7 x 3.0 cm. Other: Trace fluid in the abdomen. No free air. Moderate body wall edema. Musculoskeletal: No acute musculoskeletal process. Spinal degenerative changes. IMPRESSION: 1. Mild-to-moderate anasarca a without substantial ascites. 2. No signs of significant adenopathy in the chest, abdomen or pelvis. 3. LEFT adnexal soft tissue fullness with small cystic area of the LEFT adnexa as well. Suggest follow-up pelvic sonogram for further evaluation. 4. Query mild gastric thickening versus under distension. Correlate with any symptoms of gastritis. 5. Three-vessel coronary artery calcification greatest in LEFT coronary circulation. 6. 4.1 cm ascending thoracic aortic caliber. Could consider annual imaging followup by CTA or MRA. This recommendation follows 2010 ACCF/AHA/AATS/ACR/ASA/SCA/SCAI/SIR/STS/SVM Guidelines for the Diagnosis and Management of Patients with Thoracic Aortic Disease. Circulation. 2010; 121: W737-T062. Aortic  aneurysm NOS (ICD10-I71.9) 7. Aortic atherosclerosis. Aortic Atherosclerosis (ICD10-I70.0). Electronically Signed   By: Donzetta Kohut M.D.   On: 06/26/2020 12:38   DG CHEST PORT 1 VIEW  Result Date: 06/26/2020 CLINICAL DATA:  Shortness of breath EXAM: PORTABLE CHEST 1 VIEW COMPARISON:  June 21, 2020 FINDINGS: Airspace opacity noted in the left base with small left pleural effusion. Lungs elsewhere are clear. There is cardiomegaly with pulmonary vascularity  normal. There is mitral annulus calcification. There is aortic atherosclerosis. No adenopathy. No bone lesions. IMPRESSION: Airspace opacity likely representing pneumonia left base with small left pleural effusion. Lungs elsewhere clear. Stable cardiomegaly. Aortic Atherosclerosis (ICD10-I70.0). Mitral annulus calcification also noted. Electronically Signed   By: Bretta Bang III M.D.   On: 06/26/2020 09:57   ECHOCARDIOGRAM COMPLETE  Result Date: 06/22/2020    ECHOCARDIOGRAM REPORT   Patient Name:   Erin Patterson [facility] Date of Exam: 06/22/2020 Medical Rec #:  [id number]      Height:       61.0 in Accession #:    [id number]     Weight:       118.0 lb Date of Birth:  1932-08-15       BSA:          1.509 m Patient Age:    87 years       BP:           124/86 mmHg Patient Gender: F              HR:           82 bpm. Exam Location:  Inpatient Procedure: 2D Echo, Cardiac Doppler and Color Doppler Indications:    CHF  History:        Patient has prior history of Echocardiogram examinations, most                 recent 07/28/2017. Previous Myocardial Infarction, Stroke; Risk                 Factors:Hypertension.  Sonographer:    Shirlean Kelly Referring Phys: [phone] JARED M GARDNER IMPRESSIONS  1. Left ventricular ejection fraction, by estimation, is 60 to 65%. The left ventricle has normal function. The left ventricle has no regional wall motion abnormalities. There is moderate concentric left ventricular hypertrophy. Left ventricular diastolic function could not be  evaluated.  2. Right ventricular systolic function is normal. The right ventricular size is normal.  3. The mitral valve is degenerative. Mild mitral valve regurgitation. No evidence of mitral stenosis. Moderate to severe mitral annular calcification.  4. The aortic valve is tricuspid. There is mild calcification of the aortic valve. Aortic valve regurgitation is mild. No aortic stenosis is present. FINDINGS  Left Ventricle: Left ventricular ejection fraction, by estimation, is 60 to 65%. The left ventricle has normal function. The left ventricle has no regional wall motion abnormalities. The left ventricular internal cavity size was normal in size. There is  moderate concentric left ventricular hypertrophy. Left ventricular diastolic function could not be evaluated due to mitral annular calcification (moderate or greater). Left ventricular diastolic function could not be evaluated. Right Ventricle: The right ventricular size is normal. No increase in right ventricular wall thickness. Right ventricular systolic function is normal. Left Atrium: Left atrial size was normal in size. Right Atrium: Right atrial size was normal in size. Pericardium: Trivial pericardial effusion is present. Presence of pericardial fat pad. Mitral Valve: The mitral valve is degenerative in appearance. Moderate to severe mitral annular calcification. Mild mitral valve regurgitation. No evidence of mitral valve stenosis. Tricuspid Valve: The tricuspid valve is grossly normal. Tricuspid valve regurgitation is trivial. No evidence of tricuspid stenosis. Aortic Valve: The aortic valve is tricuspid. There is mild calcification of the aortic valve. Aortic valve regurgitation is mild. Aortic regurgitation PHT measures 488 msec. No aortic stenosis is present. Aortic valve mean gradient measures 8.0 mmHg. Aortic valve peak gradient measures 13.8 mmHg.  Aortic valve area, by VTI measures 1.91 cm. Pulmonic Valve: The pulmonic valve was grossly normal.  Pulmonic valve regurgitation is not visualized. No evidence of pulmonic stenosis. Aorta: The aortic root and ascending aorta are structurally normal, with no evidence of dilitation. Venous: The inferior vena cava was not well visualized. IAS/Shunts: The atrial septum is grossly normal. Additional Comments: There is a small pleural effusion in the left lateral region.  LEFT VENTRICLE PLAX 2D LVIDd:         3.50 cm     Diastology LVIDs:         2.20 cm     LV e' medial:    4.13 cm/s LV PW:         1.60 cm     LV E/e' medial:  25.2 LV IVS:        1.40 cm     LV e' lateral:   5.98 cm/s LVOT diam:     2.00 cm     LV E/e' lateral: 17.4 LV SV:         69 LV SV Index:   46 LVOT Area:     3.14 cm  LV Volumes (MOD) LV vol d, MOD A2C: 60.8 ml LV vol d, MOD A4C: 61.4 ml LV vol s, MOD A2C: 20.3 ml LV vol s, MOD A4C: 19.8 ml LV SV MOD A2C:     40.5 ml LV SV MOD A4C:     61.4 ml LV SV MOD BP:      42.2 ml RIGHT VENTRICLE RV Basal diam:  3.30 cm RV S prime:     13.30 cm/s TAPSE (M-mode): 2.5 cm LEFT ATRIUM             Index       RIGHT ATRIUM           Index LA diam:        3.80 cm 2.52 cm/m  RA Area:     12.70 cm LA Vol (A2C):   56.5 ml 37.43 ml/m RA Volume:   27.10 ml  17.95 ml/m LA Vol (A4C):   41.6 ml 27.56 ml/m LA Biplane Vol: 50.4 ml 33.39 ml/m  AORTIC VALVE AV Area (Vmax):    1.91 cm AV Area (Vmean):   1.91 cm AV Area (VTI):     1.91 cm AV Vmax:           186.00 cm/s AV Vmean:          132.000 cm/s AV VTI:            0.360 m AV Peak Grad:      13.8 mmHg AV Mean Grad:      8.0 mmHg LVOT Vmax:         113.00 cm/s LVOT Vmean:        80.100 cm/s LVOT VTI:          0.219 m LVOT/AV VTI ratio: 0.61 AI PHT:            488 msec  AORTA Ao Root diam: 3.20 cm MITRAL VALVE                TRICUSPID VALVE MV Area (PHT): 3.21 cm     TR Peak grad:   17.3 mmHg MV Decel Time: 236 msec     TR Vmax:        208.00 cm/s MV E velocity: 104.00 cm/s MV A velocity: 134.00 cm/s  SHUNTS MV E/A ratio:  0.78  Systemic VTI:  0.22 m                              Systemic Diam: 2.00 cm Lennie Odor MD Electronically signed by Lennie Odor MD Signature Date/Time: 06/22/2020/3:59:06 PM    Final    VAS [place] LOWER EXTREMITY VENOUS (DVT)  Result Date: 06/24/2020  Lower Venous DVT Study Patient Name:  [facility]  Date of Exam:   06/23/2020 Medical Rec #: [id number]       Accession #:    [id number] Date of Birth: [birth year]        Patient Gender: F Patient Age:   69Y Exam Location:  [facility] Procedure:      VAS [place] LOWER EXTREMITY VENOUS (DVT) Referring Phys: [id number] [facility] Cornellius Kropp --------------------------------------------------------------------------------  Indications: Swelling, and SOB.  Limitations: Poor ultrasound/tissue interface. Comparison Study: No prior studies. Performing Technologist: Jean Rosenthal RDMS,RVT  Examination Guidelines: A complete evaluation includes B-mode imaging, spectral Doppler, color Doppler, and power Doppler as needed of all accessible portions of each vessel. Bilateral testing is considered an integral part of a complete examination. Limited examinations for reoccurring indications may be performed as noted. The reflux portion of the exam is performed with the patient in reverse Trendelenburg.  +---------+---------------+---------+-----------+----------+-------------------+ RIGHT    CompressibilityPhasicitySpontaneityPropertiesThrombus Aging      +---------+---------------+---------+-----------+----------+-------------------+ CFV      Full           Yes      Yes                                      +---------+---------------+---------+-----------+----------+-------------------+ SFJ      Full                                                             +---------+---------------+---------+-----------+----------+-------------------+ FV Prox  Full                                                             +---------+---------------+---------+-----------+----------+-------------------+  FV Mid   Full                                                             +---------+---------------+---------+-----------+----------+-------------------+ FV DistalFull                                                             +---------+---------------+---------+-----------+----------+-------------------+ PFV      Full                                                             +---------+---------------+---------+-----------+----------+-------------------+  POP      Full           Yes      Yes                                      +---------+---------------+---------+-----------+----------+-------------------+ PTV      Full                                                             +---------+---------------+---------+-----------+----------+-------------------+ PERO                    Yes      Yes                  Not well visualized +---------+---------------+---------+-----------+----------+-------------------+   +---------+---------------+---------+-----------+----------+-------------------+ LEFT     CompressibilityPhasicitySpontaneityPropertiesThrombus Aging      +---------+---------------+---------+-----------+----------+-------------------+ CFV      Full           Yes      Yes                                      +---------+---------------+---------+-----------+----------+-------------------+ SFJ      Full                                                             +---------+---------------+---------+-----------+----------+-------------------+ FV Prox  Full                                                             +---------+---------------+---------+-----------+----------+-------------------+ FV Mid   Full                                                             +---------+---------------+---------+-----------+----------+-------------------+ FV DistalFull                                                              +---------+---------------+---------+-----------+----------+-------------------+ PFV      Full                                                             +---------+---------------+---------+-----------+----------+-------------------+ POP      Full  Yes      Yes                                      +---------+---------------+---------+-----------+----------+-------------------+ PTV                     Yes      Yes                  Not well visualized +---------+---------------+---------+-----------+----------+-------------------+ PERO                    Yes      Yes                  Not well visualized +---------+---------------+---------+-----------+----------+-------------------+     Summary: RIGHT: - There is no evidence of deep vein thrombosis in the lower extremity. However, portions of this examination were limited- see technologist comments above.  - A cystic structure is found in the popliteal fossa. - Ultrasound characteristics of enlarged lymph nodes are noted in the groin.  LEFT: - There is no evidence of deep vein thrombosis in the lower extremity. However, portions of this examination were limited- see technologist comments above.  - A cystic structure is found in the popliteal fossa. - Ultrasound characteristics of enlarged lymph nodes noted in the groin.  *See table(s) above for measurements and observations. Electronically signed by Coral Else MD on 06/24/2020 at 4:16:58 PM.    Final     Microbiology: Recent Results (from the past 240 hour(s))  SARS CORONAVIRUS 2 (TAT 6-24 HRS) Nasopharyngeal Nasopharyngeal Swab     Status: None   Collection Time: 06/21/20 11:24 PM   Specimen: Nasopharyngeal Swab  Result Value Ref Range Status   SARS Coronavirus 2 NEGATIVE NEGATIVE Final    Comment: (NOTE) SARS-CoV-2 target nucleic acids are NOT DETECTED.  The SARS-CoV-2 RNA is generally detectable in upper and lower respiratory specimens during the acute phase of  infection. Negative results do not preclude SARS-CoV-2 infection, do not rule out co-infections with other pathogens, and should not be used as the sole basis for treatment or other patient management decisions. Negative results must be combined with clinical observations, patient history, and epidemiological information. The expected result is Negative.  Fact Sheet for Patients: HairSlick.no  Fact Sheet for Healthcare Providers: quierodirigir.com  This test is not yet approved or cleared by the [place] FDA and  has been authorized for detection and/or diagnosis of SARS-CoV-2 by FDA under an Emergency Use Authorization (EUA). This EUA will remain  in effect (meaning this test can be used) for the duration of the COVID-19 declaration under Se ction 564(b)(1) of the Act, 21 U.S.C. section 360bbb-3(b)(1), unless the authorization is terminated or revoked sooner.  Performed at [facility], [address] [place], [place] [address]   Culture, Urine     Status: Abnormal   Collection Time: 06/25/20 11:25 AM   Specimen: Urine, Random  Result Value Ref Range Status   Specimen Description URINE, RANDOM  Final   Special Requests   Final    NONE Performed at [facility], [address], [place], [place] [address]    Culture MULTIPLE SPECIES PRESENT, SUGGEST RECOLLECTION (A)  Final   Report Status 06/26/2020 FINAL  Final     Labs: Basic Metabolic Panel: Recent Labs  Lab 06/23/20 0416 06/23/20 0942 06/24/20 0313 06/25/20 0230  06/26/20 0501 06/27/20 0405  NA 131*  --  131* 132* 133* 134*  K 3.2*  --  3.4* 3.2* 4.0 3.3*  CL 95*  --  95* 95* 95* 99  CO2 29  --  28 30 33* 29  GLUCOSE 84  --  86 86 94 88  BUN 18  --  24* 30* 33* 33*  CREATININE 0.90  --  0.89 1.03* 1.17* 0.99  CALCIUM 7.9*  --  7.5* 7.6* 7.7* 7.7*  MG  --  1.5*  --  1.9  --   --    Liver Function Tests: Recent Labs  Lab 06/21/20 1616  06/24/20 0656 06/25/20 0230 06/27/20 0405  AST 128* 87* 78* 73*  ALT 63* 46* 42 42  ALKPHOS 93 64 63 65  BILITOT 1.0 0.8 0.9 0.8  PROT 6.7 5.5* 5.7* 5.9*  ALBUMIN 2.3* 1.9* 2.0* 2.0*   No results for input(s): LIPASE, AMYLASE in the last 168 hours. No results for input(s): AMMONIA in the last 168 hours. CBC: Recent Labs  Lab 06/21/20 1616 06/23/20 0416 06/25/20 0230 06/26/20 0501 06/27/20 0405  WBC 6.6 6.1 5.7  --   --   NEUTROABS 3.2  --   --   --   --   HGB 11.2* 10.2* 9.5* 10.7* 10.1*  HCT 32.0* 27.9* 26.5* 29.8* 28.3*  MCV 77.1* 74.4* 74.4*  --   --   PLT 223 164 167  --   --    Cardiac Enzymes: No results for input(s): CKTOTAL, CKMB, CKMBINDEX, TROPONINI in the last 168 hours. BNP: BNP (last 3 results) Recent Labs    06/21/20 1616  BNP 550.4*    ProBNP (last 3 results) No results for input(s): PROBNP in the last 8760 hours.  CBG: No results for input(s): GLUCAP in the last 168 hours.     Signed:  Edsel Petrin  [facility] 06/27/2020, 9:56 AM

## 2020-06-25 NOTE — Progress Notes (Signed)
Progress Note  Patient Name: Erin Patterson Date of Encounter: 06/25/2020  [facility] Cardiologist: Jodelle Red, MD   Subjective   Feels better today.  Still with some minor lower extremity edema.  Likely chronic.  Speaks Cambodian  Inpatient Medications    Scheduled Meds: . aspirin EC  81 mg Oral Daily  . atorvastatin  40 mg Oral q1800  . carvedilol  3.125 mg Oral BID WC  . enoxaparin (LOVENOX) injection  40 mg Subcutaneous Q24H  . furosemide  40 mg Intravenous BID  . lisinopril  20 mg Oral Daily  . sodium chloride flush  3 mL Intravenous Q12H   Continuous Infusions: . sodium chloride     PRN Meds: sodium chloride, acetaminophen, ondansetron (ZOFRAN) IV, sodium chloride flush   Vital Signs    Vitals:   06/24/20 1141 06/24/20 1800 06/24/20 2112 06/25/20 0437  BP: 111/62  108/70 (!) 104/55  Pulse: 71  77 66  Resp: 16  14 18   Temp: 97.6 F (36.4 C) 98.6 F (37 C) 98.4 F (36.9 C) 98.1 F (36.7 C)  TempSrc: Oral Oral Oral Oral  SpO2: 99%  97% 97%  Weight:    61.1 kg    Intake/Output Summary (Last 24 hours) at 06/25/2020 0910 Last data filed at 06/25/2020 0534 Patterson per 24 hour  Intake 240 ml  Output 550 ml  Net -310 ml   Last 3 Weights 06/25/2020 06/24/2020 06/23/2020  Weight (lbs) 134 lb 11.2 oz 138 lb 11.2 oz 139 lb 8.8 oz  Weight (kg) 61.1 kg 62.914 kg 63.3 kg      Telemetry    No adverse arrhythmias- Personally Reviewed  ECG    No new- Personally Reviewed  Physical Exam   GEN: No acute distress.   Neck: No JVD Cardiac: RRR, no murmurs, rubs, or gallops.  Respiratory: Clear to auscultation bilaterally. GI: Soft, nontender, non-distended  MS:  1+ lower extremity chronic appearing edema; No deformity.  Improved Neuro:  Nonfocal  Psych: Normal affect   Labs    High Sensitivity Troponin:  No results for input(s): TROPONINIHS in the last 720 hours.    Chemistry Recent Labs  Lab 06/21/20 1616 06/22/20 0135 06/23/20 0416  06/24/20 0313 06/24/20 0656 06/25/20 0230  NA 133*   < > 131* 131*  --  132*  K 4.2   < > 3.2* 3.4*  --  3.2*  CL 99   < > 95* 95*  --  95*  CO2 29   < > 29 28  --  30  GLUCOSE 96   < > 84 86  --  86  BUN 11   < > 18 24*  --  30*  CREATININE 0.73   < > 0.90 0.89  --  1.03*  CALCIUM 8.1*   < > 7.9* 7.5*  --  7.6*  PROT 6.7  --   --   --  5.5* 5.7*  ALBUMIN 2.3*  --   --   --  1.9* 2.0*  AST 128*  --   --   --  87* 78*  ALT 63*  --   --   --  46* 42  ALKPHOS 93  --   --   --  64 63  BILITOT 1.0  --   --   --  0.8 0.9  GFRNONAA >60   < > >60 >60  --  53*  ANIONGAP 5   < > 7 8  --  7   < > = values in this interval not displayed.     Hematology Recent Labs  Lab 06/21/20 1616 06/23/20 0416 06/25/20 0230  WBC 6.6 6.1 5.7  RBC 4.15 3.75* 3.56*  HGB 11.2* 10.2* 9.5*  HCT 32.0* 27.9* 26.5*  MCV 77.1* 74.4* 74.4*  MCH 27.0 27.2 26.7  MCHC 35.0 36.6* 35.8  RDW 17.4* 16.6* 16.5*  PLT 223 164 167    BNP Recent Labs  Lab 06/21/20 1616  BNP 550.4*     DDimer No results for input(s): DDIMER in the last 168 hours.   Radiology    VAS [place] LOWER EXTREMITY VENOUS (DVT)  Result Date: 06/24/2020  Lower Venous DVT Study Patient Name:  Erin Patterson  Date of Exam:   06/23/2020 Medical Rec #: [id number]       Accession #:    [id number] Date of Birth: [birth year]        Patient Gender: F Patient Age:   44Y Exam Location:  [facility] Procedure:      VAS [place] LOWER EXTREMITY VENOUS (DVT) Referring Phys: [id number] Erin Patterson --------------------------------------------------------------------------------  Indications: Swelling, and SOB.  Limitations: Poor ultrasound/tissue interface. Comparison Study: No prior studies. Performing Technologist: Jean Rosenthal RDMS,RVT  Examination Guidelines: A complete evaluation includes B-mode imaging, spectral Doppler, color Doppler, and power Doppler as needed of all accessible portions of each vessel. Bilateral testing is considered an integral part  of a complete examination. Limited examinations for reoccurring indications may be performed as noted. The reflux portion of the exam is performed with the patient in reverse Trendelenburg.  +---------+---------------+---------+-----------+----------+-------------------+ RIGHT    CompressibilityPhasicitySpontaneityPropertiesThrombus Aging      +---------+---------------+---------+-----------+----------+-------------------+ CFV      Full           Yes      Yes                                      +---------+---------------+---------+-----------+----------+-------------------+ SFJ      Full                                                             +---------+---------------+---------+-----------+----------+-------------------+ FV Prox  Full                                                             +---------+---------------+---------+-----------+----------+-------------------+ FV Mid   Full                                                             +---------+---------------+---------+-----------+----------+-------------------+ FV DistalFull                                                             +---------+---------------+---------+-----------+----------+-------------------+  PFV      Full                                                             +---------+---------------+---------+-----------+----------+-------------------+ POP      Full           Yes      Yes                                      +---------+---------------+---------+-----------+----------+-------------------+ PTV      Full                                                             +---------+---------------+---------+-----------+----------+-------------------+ PERO                    Yes      Yes                  Not well visualized +---------+---------------+---------+-----------+----------+-------------------+    +---------+---------------+---------+-----------+----------+-------------------+ LEFT     CompressibilityPhasicitySpontaneityPropertiesThrombus Aging      +---------+---------------+---------+-----------+----------+-------------------+ CFV      Full           Yes      Yes                                      +---------+---------------+---------+-----------+----------+-------------------+ SFJ      Full                                                             +---------+---------------+---------+-----------+----------+-------------------+ FV Prox  Full                                                             +---------+---------------+---------+-----------+----------+-------------------+ FV Mid   Full                                                             +---------+---------------+---------+-----------+----------+-------------------+ FV DistalFull                                                             +---------+---------------+---------+-----------+----------+-------------------+ PFV      Full                                                             +---------+---------------+---------+-----------+----------+-------------------+  POP      Full           Yes      Yes                                      +---------+---------------+---------+-----------+----------+-------------------+ PTV                     Yes      Yes                  Not well visualized +---------+---------------+---------+-----------+----------+-------------------+ PERO                    Yes      Yes                  Not well visualized +---------+---------------+---------+-----------+----------+-------------------+     Summary: RIGHT: - There is no evidence of deep vein thrombosis in the lower extremity. However, portions of this examination were limited- see technologist comments above.  - A cystic structure is found in the popliteal fossa. - Ultrasound  characteristics of enlarged lymph nodes are noted in the groin.  LEFT: - There is no evidence of deep vein thrombosis in the lower extremity. However, portions of this examination were limited- see technologist comments above.  - A cystic structure is found in the popliteal fossa. - Ultrasound characteristics of enlarged lymph nodes noted in the groin.  *See table(s) above for measurements and observations. Electronically signed by Coral Else MD on 06/24/2020 at 4:16:58 PM.    Final     Cardiac Studies   Echo with normal EF  Patient Profile     87 y.o. female here with acute diastolic heart failure, lower extremity edema weakness fatigue shortness of breath.  Assessment & Plan    Acute diastolic heart failure Essential hypertension Hyperlipidemia History of CVA Hyponatremia Hypokalemia  - BUN increased from 8> 18> 24>30.  Creatinine remained stable at 0.89>1/03. - Has received IV Lasix.  Still has some minimal 1+ pitting lower extremity edema but this may be fairly chronic.  Can try compression hose as outpatient. -Instead of lisinopril/hydrochlorothiazide, I would utilize lisinopril and Lasix 20 mg daily at home.  This should help hyponatremia theoretically as well as edema. -Continue to replete potassium -LFTs improving -TSH normal -Atorvastatin 40 continue.  LFTs are improving. -Echocardiogram reassuring.  Encourage physical activity as outpatient.  From a cardiology perspective I am comfortable with her discharge.  We will go ahead and sign off.  Please let us know if we can be of further assistance.  For questions or updates, please contact [facility] Please consult [email or web address] for contact info under        Signed, Donato Schultz, MD  06/25/2020, 9:10 AM

## 2020-06-25 NOTE — Progress Notes (Signed)
PROGRESS NOTE    Erin Patterson  OTL:[id number] DOB: 1932-09-30 DOA: 06/21/2020 PCP: Jackie Plum, MD   Brief Narrative:  On 06/21/2020 by Dr. Lyda Perone Erin Patterson is a 87 y.o. female with medical history significant of HTN, HLD, prior stroke and MI.  Pt presents to ED with c/o increased BLE edema and fatigue.  Symptoms onset about 1 month ago.  Worsening, persistent, now severe.  Associated SOB that is worse when she lays down flat.  No CP.  No cough, no fevers nor chills.  Interim history Patient presented with leg swelling and fatigue.  Found to have elevated BNP and mild CHF findings on x-ray.  Patient admitted with suspected new onset CHF.  Pending work-up.  On IV Lasix. Assessment & Plan   Lower extremity edema/anasarca, New onset diastolic CHF -Patient with no history of CHF -Last echocardiogram/TEE done 07/30/2017 which shows an EF of 55 to 60%, diffuse hypokinesis -Patient presented with lower extremity swelling as well as fatigue. -BNP 550.4 -Chest x-ray showed mild CHF, vascular congestion and small pleural effusions -Continue IV Lasix 40 mg daily -Echocardiogram this admission showed an EF of 60 to 65% no regional wall motion abnormalities.  LVH.  LV diastolic function could not be evaluated.  RV systolic function normal.  Mitral valve is degenerative.  Mild mitral valve regurgitation.  Moderate to severe mitral annular calcification -Monitor intake and output, daily weights-weight down approximately 2 kg since admission -Lower extremity Doppler negative for DVT -Cardiology consulted and appreciated-feels lower extremity edema may be chronic, recommended lisinopril and Lasix on discharge.  Fatigue -Patient continues to complain of feeling weak and tired especially with walking -Multifactorial including CHF exacerbation versus anemia versus other etiology given enlargement of lymph nodes -Suspect secondary to the above -TSH 1.575, vitamin B12 level 867,  vitamin D level (25-hydroxy) 31.12 -Consult PT  Microcytic anemia -Hemoglobin dropped to 9.5 -Will obtain anemia panel and FOBT -Continue to monitor CBC  Essential hypertension -Continue Coreg, lisinopril -Continue IV Lasix  History of CVA -Continue statin, aspirin  Mild hyponatremia -Likely secondary to hypervolemia versus diuretics -Continue to monitor BMP  Hypokalemia/hypomagnesemia -Likely secondary to diuresis  -continue to replace -Continue to monitor  Mildly elevated LFTs -Improving, continue to trend downward -Continue to monitor  Enlarged lymph nodes -Noted on ultrasound of the lower extremities -Will obtain UA/urine culture as well as CT abdomen and pelvis without contrast  DVT Prophylaxis Lovenox  Code Status: Full  Family Communication: Husband at bedside  Disposition Plan:  Status is: Inpatient  Remains inpatient appropriate because:IV treatments appropriate due to intensity of illness or inability to take PO   Dispo: The patient is from: Home              Anticipated d/c is to: Home              Patient currently is not medically stable to d/c.   Difficult to place patient No   Consultants Cardiology  Procedures  Echocardiogram  Antibiotics   Anti-infectives (From admission, onward)   None      Subjective:   Erin Patterson seen and examined today.  Translator used.  Continues to complain of feeling tired and unable to take more than a few steps without feeling exhausted.  Feels her lower extremity swelling has improved.  Denies chest pain, abdominal pain, nausea or vomiting, diarrhea or constipation, dizziness or headache.   Objective:   Vitals:   06/24/20 1141 06/24/20 1800 06/24/20 2112 06/25/20 9741  BP: 111/62  108/70 (!) 104/55  Pulse: 71  77 66  Resp: 16  14 18   Temp: 97.6 F (36.4 C) 98.6 F (37 C) 98.4 F (36.9 C) 98.1 F (36.7 C)  TempSrc: Oral Oral Oral Oral  SpO2: 99%  97% 97%  Weight:    61.1 kg    Intake/Output  Summary (Last 24 hours) at 06/25/2020 1125 Last data filed at 06/25/2020 0534 Gross per 24 hour  Intake 240 ml  Output 550 ml  Net -310 ml   Filed Weights   06/23/20 0405 06/24/20 0341 06/25/20 0437  Weight: 63.3 kg 62.9 kg 61.1 kg   Exam  General: Well developed, chronically ill-appearing, elderly, NAD  HEENT: NCAT, mucous membranes moist.   Cardiovascular: S1 S2 auscultated, soft SEM, RRR  Respiratory: Diminished breath sounds however clear, no wheezing or rhonchi.  Abdomen: Soft, nontender, nondistended, + bowel sounds  Extremities: warm dry without cyanosis clubbing.  LE edema bilaterally  Neuro: AAOx3, nonfocal  Psych: pleasant, appropriate mood and affect  Data Reviewed: I have personally reviewed following labs and imaging studies  CBC: Recent Labs  Lab 06/21/20 1616 06/23/20 0416 06/25/20 0230  WBC 6.6 6.1 5.7  NEUTROABS 3.2  --   --   HGB 11.2* 10.2* 9.5*  HCT 32.0* 27.9* 26.5*  MCV 77.1* 74.4* 74.4*  PLT 223 164 167   Basic Metabolic Panel: Recent Labs  Lab 06/21/20 1616 06/22/20 0135 06/23/20 0416 06/23/20 0942 06/24/20 0313 06/25/20 0230  NA 133* 132* 131*  --  131* 132*  K 4.2 3.5 3.2*  --  3.4* 3.2*  CL 99 99 95*  --  95* 95*  CO2 29 27 29   --  28 30  GLUCOSE 96 91 84  --  86 86  BUN 11 8 18   --  24* 30*  CREATININE 0.73 0.69 0.90  --  0.89 1.03*  CALCIUM 8.1* 7.8* 7.9*  --  7.5* 7.6*  MG  --   --   --  1.5*  --  1.9   GFR: CrCl cannot be calculated (Unknown ideal weight.). Liver Function Tests: Recent Labs  Lab 06/21/20 1616 06/24/20 0656 06/25/20 0230  AST 128* 87* 78*  ALT 63* 46* 42  ALKPHOS 93 64 63  BILITOT 1.0 0.8 0.9  PROT 6.7 5.5* 5.7*  ALBUMIN 2.3* 1.9* 2.0*   No results for input(s): LIPASE, AMYLASE in the last 168 hours. No results for input(s): AMMONIA in the last 168 hours. Coagulation Profile: No results for input(s): INR, PROTIME in the last 168 hours. Cardiac Enzymes: No results for input(s): CKTOTAL, CKMB,  CKMBINDEX, TROPONINI in the last 168 hours. BNP (last 3 results) No results for input(s): PROBNP in the last 8760 hours. HbA1C: No results for input(s): HGBA1C in the last 72 hours. CBG: No results for input(s): GLUCAP in the last 168 hours. Lipid Profile: No results for input(s): CHOL, HDL, LDLCALC, TRIG, CHOLHDL, LDLDIRECT in the last 72 hours. Thyroid Function Tests: Recent Labs    06/23/20 0805  TSH 1.575   Anemia Panel: Recent Labs    06/23/20 0942  VITAMINB12 867   Urine analysis:    Component Value Date/Time   COLORURINE COLORLESS (A) 07/26/2017 0950   APPEARANCEUR CLEAR 07/26/2017 0950   LABSPEC 1.004 (L) 07/26/2017 0950   PHURINE 8.0 07/26/2017 0950   GLUCOSEU 50 (A) 07/26/2017 0950   HGBUR SMALL (A) 07/26/2017 0950   BILIRUBINUR NEGATIVE 07/26/2017 0950   KETONESUR NEGATIVE 07/26/2017 0950  PROTEINUR 30 (A) 07/26/2017 0950   UROBILINOGEN 1.0 12/10/2014 1225   NITRITE NEGATIVE 07/26/2017 0950   LEUKOCYTESUR TRACE (A) 07/26/2017 0950   Sepsis Labs: @LABRCNTIP (procalcitonin:4,lacticidven:4)  ) Recent Results (from the past 240 hour(s))  SARS CORONAVIRUS 2 (TAT 6-24 HRS) Nasopharyngeal Nasopharyngeal Swab     Status: None   Collection Time: 06/21/20 11:24 PM   Specimen: Nasopharyngeal Swab  Result Value Ref Range Status   SARS Coronavirus 2 NEGATIVE NEGATIVE Final    Comment: (NOTE) SARS-CoV-2 target nucleic acids are NOT DETECTED.  The SARS-CoV-2 RNA is generally detectable in upper and lower respiratory specimens during the acute phase of infection. Negative results do not preclude SARS-CoV-2 infection, do not rule out co-infections with other pathogens, and should not be used as the sole basis for treatment or other patient management decisions. Negative results must be combined with clinical observations, patient history, and epidemiological information. The expected result is Negative.  Fact Sheet for  Patients: 06/23/20  Fact Sheet for Healthcare Providers: HairSlick.no  This test is not yet approved or cleared by the [place] FDA and  has been authorized for detection and/or diagnosis of SARS-CoV-2 by FDA under an Emergency Use Authorization (EUA). This EUA will remain  in effect (meaning this test can be used) for the duration of the COVID-19 declaration under Se ction 564(b)(1) of the Act, 21 U.S.C. section 360bbb-3(b)(1), unless the authorization is terminated or revoked sooner.  Performed at [facility], [address] [place], [place]       Radiology Studies: No results found.   Scheduled Meds: . aspirin EC  81 mg Oral Daily  . atorvastatin  40 mg Oral q1800  . carvedilol  3.125 mg Oral BID WC  . enoxaparin (LOVENOX) injection  40 mg Subcutaneous Q24H  . furosemide  40 mg Intravenous BID  . lisinopril  20 mg Oral Daily  . sodium chloride flush  3 mL Intravenous Q12H   Continuous Infusions: . sodium chloride       LOS: 4 days   Time Spent in minutes   30 minutes  Reagen Haberman D.O. on 06/25/2020 at 11:25 AM  Between 7am to 7pm - Please see pager noted on [email or web address]  After 7pm go to [email or web address]  And look for the night coverage person covering for me after hours  [facility] Office  [phone]

## 2020-06-26 ENCOUNTER — Inpatient Hospital Stay (HOSPITAL_COMMUNITY): Payer: Medicaid Other

## 2020-06-26 LAB — BASIC METABOLIC PANEL
Anion gap: 5 (ref 5–15)
BUN: 33 mg/dL — ABNORMAL HIGH (ref 8–23)
CO2: 33 mmol/L — ABNORMAL HIGH (ref 22–32)
Calcium: 7.7 mg/dL — ABNORMAL LOW (ref 8.9–10.3)
Chloride: 95 mmol/L — ABNORMAL LOW (ref 98–111)
Creatinine, Ser: 1.17 mg/dL — ABNORMAL HIGH (ref 0.44–1.00)
GFR, Estimated: 46 mL/min — ABNORMAL LOW (ref >60)
Glucose, Bld: 94 mg/dL (ref 70–99)
Potassium: 4 mmol/L (ref 3.5–5.1)
Sodium: 133 mmol/L — ABNORMAL LOW (ref 135–145)

## 2020-06-26 LAB — URINE CULTURE

## 2020-06-26 LAB — PROCALCITONIN: Procalcitonin: 0.24 ng/mL

## 2020-06-26 LAB — HEMOGLOBIN AND HEMATOCRIT, BLOOD
HCT: 29.8 % — ABNORMAL LOW (ref 36.0–46.0)
Hemoglobin: 10.7 g/dL — ABNORMAL LOW (ref 12.0–15.0)

## 2020-06-26 MED ORDER — DIPHENHYDRAMINE HCL 12.5 MG/5ML PO ELIX
12.5000 mg | ORAL_SOLUTION | Freq: Once | ORAL | Status: AC
  Administered 2020-06-26: 12.5 mg via ORAL
  Filled 2020-06-26: qty 5

## 2020-06-26 MED ORDER — IOHEXOL 9 MG/ML PO SOLN
ORAL | Status: AC
  Filled 2020-06-26: qty 1000

## 2020-06-26 MED ORDER — FUROSEMIDE 10 MG/ML IJ SOLN
20.0000 mg | Freq: Once | INTRAMUSCULAR | Status: AC
  Administered 2020-06-26: 20 mg via INTRAVENOUS
  Filled 2020-06-26: qty 2

## 2020-06-26 MED ORDER — FUROSEMIDE 10 MG/ML IJ SOLN
40.0000 mg | Freq: Every day | INTRAMUSCULAR | Status: DC
  Administered 2020-06-27: 40 mg via INTRAVENOUS
  Filled 2020-06-26: qty 4

## 2020-06-26 NOTE — Plan of Care (Signed)
  Problem: Education: Goal: Ability to demonstrate management of disease process will improve Outcome: Progressing Goal: Ability to verbalize understanding of medication therapies will improve Outcome: Progressing Goal: Individualized Educational Video(s) Outcome: Progressing   Problem: Activity: Goal: Capacity to carry out activities will improve Outcome: Progressing   Problem: Cardiac: Goal: Ability to achieve and maintain adequate cardiopulmonary perfusion will improve Outcome: Progressing   Problem: Education: Goal: Knowledge of General Education information will improve Description: Including pain rating scale, medication(s)/side effects and non-pharmacologic comfort measures Outcome: Progressing   Problem: Clinical Measurements: Goal: Ability to maintain clinical measurements within normal limits will improve Outcome: Progressing Goal: Will remain free from infection Outcome: Progressing Goal: Diagnostic test results will improve Outcome: Progressing Goal: Respiratory complications will improve Outcome: Progressing Goal: Cardiovascular complication will be avoided Outcome: Progressing   Problem: Activity: Goal: Risk for activity intolerance will decrease Outcome: Progressing

## 2020-06-26 NOTE — Progress Notes (Signed)
Heart Failure Stewardship Pharmacist Progress Note   PCP: Jackie Plum, MD PCP-Cardiologist: Jodelle Red, MD    HPI:  87 yo F with PMH of HTN, HLD, prior stroke, and MI. He presented to the ED on 06/21/20 with LE edema and fatigue. She has been admitted for new onset CHF. An ECHO was done on 06/22/20 and LVEF was 60-65%. CXR on 06/21/20 with mild CHF. Repeat CXR on 06/25/20 shows airspace opacity likely representing pneumonia, but with improved CHF and clear lungs elsewhere.   Current HF Medications: Furosemide 40 mg IV daily Carvedilol 3.125 mg BID Lisinopril 20 mg daily  Prior to admission HF Medications: Lisinopril 20 mg daily  Pertinent Lab Values: . Serum creatinine 1.17, BUN 33, Potassium 4.0, Sodium 133, BNP 550.4, Magnesium 1.9  Vital Signs: . Weight: 135 lbs (admission weight: 143 lbs) . Blood pressure: 110-130/60s  . Heart rate: 60-70s   Medication Assistance / Insurance Benefits Check: Does the patient have prescription insurance?  Yes Type of insurance plan: [place] Medicaid  Outpatient Pharmacy:  Prior to admission outpatient pharmacy: [facility] Is the patient willing to use [facility] TOC pharmacy at discharge? Yes Is the patient willing to transition their outpatient pharmacy to utilize a [facility] outpatient pharmacy?   Pending    Assessment: 1. Acute on chronic diastolic CHF (EF 59-16%), due to presumed NICM. NYHA class II symptoms. - Continue furosemide 40 mg IV daily - Continue carvedilol 3.125 mg BID - Continue lisinopril 20 mg daily - Consider starting Jardiance 10 mg daily prior to discharge for HFpEF   Plan: 1) Medication changes recommended at this time: - Add Jardiance 10 mg daily  2) Patient assistance: - Patient has Medicaid - all prescriptions should be $0-3 each per month  3)  Education  - To be completed prior to discharge  Sharen Hones, PharmD, BCPS Heart Failure Engineer, building services Phone [phone]

## 2020-06-26 NOTE — Progress Notes (Signed)
Physician notified, patient complains of shortness of breath at rest.  Additional dose of IV lasix ordered, given.  Patient positioned in bed with head elevated. 1L 02 via nasal cannula applied. Treatment explained to patient using interpreter.

## 2020-06-26 NOTE — Progress Notes (Addendum)
PROGRESS NOTE    Erin Patterson  YME:[id number] DOB: 1932/09/22 DOA: 06/21/2020 PCP: Jackie Plum, MD   Brief Narrative:  On 06/21/2020 by Dr. Lyda Perone Erin Patterson is a 87 y.o. female with medical history significant of HTN, HLD, prior stroke and MI.  Pt presents to ED with c/o increased BLE edema and fatigue.  Symptoms onset about 1 month ago.  Worsening, persistent, now severe.  Associated SOB that is worse when she lays down flat.  No CP.  No cough, no fevers nor chills.  Interim history Patient presented with leg swelling and fatigue.  Found to have elevated BNP and mild CHF findings on x-ray.  Patient admitted with suspected new onset CHF.    On IV Lasix.  Complain of shortness of breath.  Have ordered CT chest. Assessment & Plan   Lower extremity edema/anasarca, New onset diastolic CHF -Patient with no history of CHF -Last echocardiogram/TEE done 07/30/2017 which shows an EF of 55 to 60%, diffuse hypokinesis -Patient presented with lower extremity swelling as well as fatigue. -BNP 550.4 -Chest x-ray showed mild CHF, vascular congestion and small pleural effusions -Continue IV Lasix 40 mg twice daily-will decrease dose to once daily -Echocardiogram this admission showed an EF of 60 to 65% no regional wall motion abnormalities.  LVH.  LV diastolic function could not be evaluated.  RV systolic function normal.  Mitral valve is degenerative.  Mild mitral valve regurgitation.  Moderate to severe mitral annular calcification -Monitor intake and output, daily weights-weight down approximately 2 kg since admission -Lower extremity Doppler negative for DVT -Cardiology consulted and appreciated-feels lower extremity edema may be chronic, recommended lisinopril and Lasix on discharge. -Patient continues to feel short of breath on examination, chest x-ray obtained and shows improvement in CHF findings however questionable pneumonia -Lung sounds are clear on examination -CT chest  ordered  Fatigue -Patient continues to complain of feeling weak and tired especially with walking -Multifactorial including CHF exacerbation versus anemia versus other etiology given enlargement of lymph nodes -Suspect secondary to the above -TSH 1.575, vitamin B12 level 867, vitamin D level (25-hydroxy) 31.12 -PT consulted and evaluated patient, no follow-up needed.  Rolling walker with 5" wheels  Acute kidney injury -Creatinine baseline under 1, currently up to 1.17 -Suspect secondary to IV diuresis  -Will decrease dose of IV Lasix and continue to monitor BMP closely  Microcytic anemia -Hemoglobin currently 10.7 -Anemia panel showed adequate iron and iron storage -Continue to monitor CBC  Essential hypertension -Continue Coreg, lisinopril -Continue IV Lasix  History of CVA -Continue statin, aspirin  Mild hyponatremia -Likely secondary to hypervolemia versus diuretics -Sodium currently 133 -Continue to monitor BMP  Hypokalemia/hypomagnesemia -Likely secondary to diuresis  -continue to replace -Continue to monitor  Mildly elevated LFTs -Improving, continue to trend downward -Continue to monitor  Enlarged lymph nodes -Noted on ultrasound of the lower extremities -UA obtained, unremarkable for infection -Pending CT abdomen pelvis  DVT Prophylaxis Lovenox  Code Status: Full  Family Communication: Husband at bedside  Disposition Plan:  Status is: Inpatient  Remains inpatient appropriate because:IV treatments appropriate due to intensity of illness or inability to take PO   Dispo: The patient is from: Home              Anticipated d/c is to: Home              Patient currently is not medically stable to d/c.   Difficult to place patient No   Consultants Cardiology  Procedures  Echocardiogram Lower extremity Doppler  Antibiotics   Anti-infectives (From admission, onward)   None      Subjective:   Erin Patterson seen and examined today.  Translator  used.  No longer complaining of feeling tired but feels more short of breath.  It seems that overnight patient had more shortness of breath after ingesting contrast.  Currently denies abdominal pain, nausea or vomiting, diarrhea or constipation, dizziness or headache.  Feels her breathing is improved.   Objective:   Vitals:   06/25/20 1503 06/25/20 1932 06/26/20 0511 06/26/20 0902  BP: (!) 105/58 (!) 114/54 117/72 (!) 131/55  Pulse: 65 66 65 66  Resp: 17 16 16 18   Temp: 98.5 F (36.9 C) 98.7 F (37.1 C) 97.7 F (36.5 C) 98.2 F (36.8 C)  TempSrc: Oral Oral Oral Oral  SpO2: 96% 98% 95% 100%  Weight:   61.2 kg     Intake/Output Summary (Last 24 hours) at 06/26/2020 1010 Last data filed at 06/26/2020 08/26/2020 Gross per 24 hour  Intake 120 ml  Output 1700 ml  Net -1580 ml   Filed Weights   06/24/20 0341 06/25/20 0437 06/26/20 0511  Weight: 62.9 kg 61.1 kg 61.2 kg   Exam  General: Well developed, chronically ill-appearing, elderly, NAD  HEENT: NCAT, mucous membranes moist.   Cardiovascular: S1 S2 auscultated, soft SEM, RRR  Respiratory: Diminished breath sounds however clear, no wheezing or rhonchi.  Abdomen: Soft, nontender, nondistended, + bowel sounds  Extremities: warm dry without cyanosis clubbing.  LE edema bilaterally  Neuro: AAOx3, nonfocal  Psych: appropriate mood and affect  Data Reviewed: I have personally reviewed following labs and imaging studies  CBC: Recent Labs  Lab 06/21/20 1616 06/23/20 0416 06/25/20 0230 06/26/20 0501  WBC 6.6 6.1 5.7  --   NEUTROABS 3.2  --   --   --   HGB 11.2* 10.2* 9.5* 10.7*  HCT 32.0* 27.9* 26.5* 29.8*  MCV 77.1* 74.4* 74.4*  --   PLT 223 164 167  --    Basic Metabolic Panel: Recent Labs  Lab 06/22/20 0135 06/23/20 0416 06/23/20 0942 06/24/20 0313 06/25/20 0230 06/26/20 0501  NA 132* 131*  --  131* 132* 133*  K 3.5 3.2*  --  3.4* 3.2* 4.0  CL 99 95*  --  95* 95* 95*  CO2 27 29  --  28 30 33*  GLUCOSE 91 84  --   86 86 94  BUN 8 18  --  24* 30* 33*  CREATININE 0.69 0.90  --  0.89 1.03* 1.17*  CALCIUM 7.8* 7.9*  --  7.5* 7.6* 7.7*  MG  --   --  1.5*  --  1.9  --    GFR: CrCl cannot be calculated (Unknown ideal weight.). Liver Function Tests: Recent Labs  Lab 06/21/20 1616 06/24/20 0656 06/25/20 0230  AST 128* 87* 78*  ALT 63* 46* 42  ALKPHOS 93 64 63  BILITOT 1.0 0.8 0.9  PROT 6.7 5.5* 5.7*  ALBUMIN 2.3* 1.9* 2.0*   No results for input(s): LIPASE, AMYLASE in the last 168 hours. No results for input(s): AMMONIA in the last 168 hours. Coagulation Profile: No results for input(s): INR, PROTIME in the last 168 hours. Cardiac Enzymes: No results for input(s): CKTOTAL, CKMB, CKMBINDEX, TROPONINI in the last 168 hours. BNP (last 3 results) No results for input(s): PROBNP in the last 8760 hours. HbA1C: No results for input(s): HGBA1C in the last 72 hours. CBG: No results for input(s): GLUCAP  in the last 168 hours. Lipid Profile: No results for input(s): CHOL, HDL, LDLCALC, TRIG, CHOLHDL, LDLDIRECT in the last 72 hours. Thyroid Function Tests: No results for input(s): TSH, T4TOTAL, FREET4, T3FREE, THYROIDAB in the last 72 hours. Anemia Panel: Recent Labs    06/25/20 1129  FOLATE 13.7  FERRITIN 88  TIBC 186*  IRON 44  RETICCTPCT 1.0   Urine analysis:    Component Value Date/Time   COLORURINE YELLOW 06/25/2020 1124   APPEARANCEUR CLEAR 06/25/2020 1124   LABSPEC 1.010 06/25/2020 1124   PHURINE 6.5 06/25/2020 1124   GLUCOSEU NEGATIVE 06/25/2020 1124   HGBUR NEGATIVE 06/25/2020 1124   BILIRUBINUR NEGATIVE 06/25/2020 1124   KETONESUR NEGATIVE 06/25/2020 1124   PROTEINUR NEGATIVE 06/25/2020 1124   UROBILINOGEN 1.0 12/10/2014 1225   NITRITE NEGATIVE 06/25/2020 1124   LEUKOCYTESUR SMALL (A) 06/25/2020 1124   Sepsis Labs: @LABRCNTIP (procalcitonin:4,lacticidven:4)  ) Recent Results (from the past 240 hour(s))  SARS CORONAVIRUS 2 (TAT 6-24 HRS) Nasopharyngeal Nasopharyngeal Swab      Status: None   Collection Time: 06/21/20 11:24 PM   Specimen: Nasopharyngeal Swab  Result Value Ref Range Status   SARS Coronavirus 2 NEGATIVE NEGATIVE Final    Comment: (NOTE) SARS-CoV-2 target nucleic acids are NOT DETECTED.  The SARS-CoV-2 RNA is generally detectable in upper and lower respiratory specimens during the acute phase of infection. Negative results do not preclude SARS-CoV-2 infection, do not rule out co-infections with other pathogens, and should not be used as the sole basis for treatment or other patient management decisions. Negative results must be combined with clinical observations, patient history, and epidemiological information. The expected result is Negative.  Fact Sheet for Patients: 06/23/20  Fact Sheet for Healthcare Providers: HairSlick.no  This test is not yet approved or cleared by the quierodirigir.com FDA and  has been authorized for detection and/or diagnosis of SARS-CoV-2 by FDA under an Emergency Use Authorization (EUA). This EUA will remain  in effect (meaning this test can be used) for the duration of the COVID-19 declaration under Se ction 564(b)(1) of the Act, 21 U.S.C. section 360bbb-3(b)(1), unless the authorization is terminated or revoked sooner.  Performed at [facility], [address] [place], [place]   Culture, Urine     Status: Abnormal   Collection Time: 06/25/20 11:25 AM   Specimen: Urine, Random  Result Value Ref Range Status   Specimen Description URINE, RANDOM  Final   Special Requests   Final    NONE Performed at [facility], [address] [place], [place]    Culture MULTIPLE SPECIES PRESENT, SUGGEST RECOLLECTION (A)  Final   Report Status 06/26/2020 FINAL  Final      Radiology Studies: DG CHEST PORT 1 VIEW  Result Date: 06/26/2020 CLINICAL DATA:  Shortness of breath EXAM: PORTABLE CHEST 1 VIEW COMPARISON:  June 21, 2020 FINDINGS: Airspace opacity noted in the left base with small left pleural effusion. Lungs elsewhere are clear. There is cardiomegaly with pulmonary vascularity normal. There is mitral annulus calcification. There is aortic atherosclerosis. No adenopathy. No bone lesions. IMPRESSION: Airspace opacity likely representing pneumonia left base with small left pleural effusion. Lungs elsewhere clear. Stable cardiomegaly. Aortic Atherosclerosis (ICD10-I70.0). Mitral annulus calcification also noted. Electronically Signed   By: 05-06-1966 III M.D.   On: 06/26/2020 09:57     Scheduled Meds: . aspirin EC  81 mg Oral Daily  . atorvastatin  40 mg Oral q1800  . carvedilol  3.125 mg Oral BID WC  . diphenhydrAMINE  12.5 mg Oral Once  . enoxaparin (LOVENOX) injection  40 mg Subcutaneous Q24H  . furosemide  40 mg Intravenous BID  . iohexol      . lisinopril  20 mg Oral Daily  . sodium chloride flush  3 mL Intravenous Q12H   Continuous Infusions: . sodium chloride       LOS: 5 days   Time Spent in minutes   30 minutes  Kendrick Haapala D.O. on 06/26/2020 at 10:10 AM  Between 7am to 7pm - Please see pager noted on [email or web address]  After 7pm go to [email or web address]  And look for the night coverage person covering for me after hours  [facility] Office  [phone]

## 2020-06-26 NOTE — Plan of Care (Signed)
  Problem: Activity: Goal: Capacity to carry out activities will improve Outcome: Progressing   Problem: Cardiac: Goal: Ability to achieve and maintain adequate cardiopulmonary perfusion will improve Outcome: Progressing   

## 2020-06-26 NOTE — Progress Notes (Signed)
Physical Therapy Treatment Patient Details Name: Erin Patterson MRN: [id number] DOB: May 29, 1932 Today's Date: 06/26/2020    History of Present Illness Patient is a 87 y/o female who presents on 06/21/20 with BLE edema, fatigue and SOB. Found to have new onset CHF. CXR- mild CHF, vascular congestion and pleural effusions.  PMH includes HTN, CAD, MI, CVA.    PT Comments    Patient progressing well towards PT goals. Reports feeling less tired today. Tolerated gait training Mod I with use of RW for support; pt feels more comfortable with UE support and says it improves her endurance. Tolerated stair training with Min A-Min guard and use of rail for safety. No dyspnea noted with activity but pt reports LEs feel tired after stairs. Pt has been getting up to bathroom in room. Will continue to follow and progress as able.   Follow Up Recommendations  No PT follow up;Supervision - Intermittent     Equipment Recommendations  Rolling walker with 5" wheels    Recommendations for Other Services       Precautions / Restrictions Precautions Precautions: Fall Restrictions Weight Bearing Restrictions: No    Mobility  Bed Mobility Overal bed mobility: Modified Independent             General bed mobility comments: No assist needed.    Transfers Overall transfer level: Modified independent Equipment used: None             General transfer comment: Stood from EOB x1 without difficulties.  Ambulation/Gait Ambulation/Gait assistance: Modified independent (Device/Increase time) Gait Distance (Feet): 350 Feet Assistive device: Rolling walker (2 wheeled) Gait Pattern/deviations: Step-through pattern;Decreased stride length;Trunk flexed   Gait velocity interpretation: <1.31 ft/sec, indicative of household ambulator General Gait Details: Very slow, steady gait with RW for support. Could not get SP02 reading due to cold fingers.   Stairs Stairs: Yes Stairs assistance: Min assist;Min  guard Stair Management: Step to pattern;One rail Left Number of Stairs: 4 General stair comments: Cues for technique/safety, 1 rail and HHA to ascend and BUEs on rail to descend. No DOE noted.   Wheelchair Mobility    Modified Rankin (Stroke Patients Only)       Balance Overall balance assessment: No apparent balance deficits (not formally assessed)                                          Cognition Arousal/Alertness: Awake/alert Behavior During Therapy: WFL for tasks assessed/performed Overall Cognitive Status: Within Functional Limits for tasks assessed                                 General Comments: For basic mobility tasks.      Exercises      General Comments General comments (skin integrity, edema, etc.): Spouse present during session. interpreter pad used May #[id number]      Pertinent Vitals/Pain Pain Assessment: Faces Faces Pain Scale: Hurts a little bit Pain Location: LEs Pain Descriptors / Indicators: Aching Pain Intervention(s): Monitored during session    Home Living                      Prior Function            PT Goals (current goals can now be found in the care plan section) Progress towards PT goals: Progressing  toward goals    Frequency    Min 3X/week      PT Plan Current plan remains appropriate    Co-evaluation              AM-PAC PT "6 Clicks" Mobility   Outcome Measure  Help needed turning from your back to your side while in a flat bed without using bedrails?: None Help needed moving from lying on your back to sitting on the side of a flat bed without using bedrails?: None Help needed moving to and from a bed to a chair (including a wheelchair)?: None Help needed standing up from a chair using your arms (e.g., wheelchair or bedside chair)?: None Help needed to walk in hospital room?: A Little Help needed climbing 3-5 steps with a railing? : A Little 6 Click Score: 22    End of  Session Equipment Utilized During Treatment: Gait belt Activity Tolerance: Patient tolerated treatment well Patient left: in bed;with call bell/phone within reach;with family/visitor present Nurse Communication: Mobility status PT Visit Diagnosis: Difficulty in walking, not elsewhere classified (R26.2)     Time: 1355-1415 PT Time Calculation (min) (ACUTE ONLY): 20 min  Charges:  $Gait Training: 8-22 mins                     Vale Haven, PT, DPT Acute Rehabilitation Services Pager [phone] Office [phone]       Blake Divine A Lanier Ensign 06/26/2020, 3:08 PM

## 2020-06-27 ENCOUNTER — Other Ambulatory Visit (HOSPITAL_COMMUNITY): Payer: Self-pay

## 2020-06-27 DIAGNOSIS — D509 Iron deficiency anemia, unspecified: Secondary | ICD-10-CM

## 2020-06-27 DIAGNOSIS — R5383 Other fatigue: Secondary | ICD-10-CM

## 2020-06-27 LAB — COMPREHENSIVE METABOLIC PANEL
ALT: 42 U/L (ref 0–44)
AST: 73 U/L — ABNORMAL HIGH (ref 15–41)
Albumin: 2 g/dL — ABNORMAL LOW (ref 3.5–5.0)
Alkaline Phosphatase: 65 U/L (ref 38–126)
Anion gap: 6 (ref 5–15)
BUN: 33 mg/dL — ABNORMAL HIGH (ref 8–23)
CO2: 29 mmol/L (ref 22–32)
Calcium: 7.7 mg/dL — ABNORMAL LOW (ref 8.9–10.3)
Chloride: 99 mmol/L (ref 98–111)
Creatinine, Ser: 0.99 mg/dL (ref 0.44–1.00)
GFR, Estimated: 56 mL/min — ABNORMAL LOW (ref >60)
Glucose, Bld: 88 mg/dL (ref 70–99)
Potassium: 3.3 mmol/L — ABNORMAL LOW (ref 3.5–5.1)
Sodium: 134 mmol/L — ABNORMAL LOW (ref 135–145)
Total Bilirubin: 0.8 mg/dL (ref 0.3–1.2)
Total Protein: 5.9 g/dL — ABNORMAL LOW (ref 6.5–8.1)

## 2020-06-27 LAB — HEMOGLOBIN AND HEMATOCRIT, BLOOD
HCT: 28.3 % — ABNORMAL LOW (ref 36.0–46.0)
Hemoglobin: 10.1 g/dL — ABNORMAL LOW (ref 12.0–15.0)

## 2020-06-27 MED ORDER — ATORVASTATIN CALCIUM 40 MG PO TABS
40.0000 mg | ORAL_TABLET | Freq: Every day | ORAL | 0 refills | Status: DC
  Filled 2020-06-27: qty 30, 30d supply, fill #0

## 2020-06-27 MED ORDER — EMPAGLIFLOZIN 10 MG PO TABS
10.0000 mg | ORAL_TABLET | Freq: Every day | ORAL | 1 refills | Status: DC
  Filled 2020-06-27: qty 30, 30d supply, fill #0

## 2020-06-27 MED ORDER — LISINOPRIL 20 MG PO TABS
20.0000 mg | ORAL_TABLET | Freq: Every day | ORAL | 1 refills | Status: DC
  Filled 2020-06-27: qty 30, 30d supply, fill #0

## 2020-06-27 MED ORDER — ASPIRIN 81 MG PO TBEC: 81.0000 mg | DELAYED_RELEASE_TABLET | Freq: Every day | ORAL | 0 refills | Status: DC

## 2020-06-27 MED ORDER — FUROSEMIDE 20 MG PO TABS
20.0000 mg | ORAL_TABLET | Freq: Every day | ORAL | 1 refills | Status: DC
  Filled 2020-06-27: qty 30, 30d supply, fill #0

## 2020-06-27 MED ORDER — CARVEDILOL 3.125 MG PO TABS
3.1250 mg | ORAL_TABLET | Freq: Two times a day (BID) | ORAL | 1 refills | Status: DC
  Filled 2020-06-27: qty 60, 30d supply, fill #0

## 2020-06-27 NOTE — Discharge Instructions (Signed)
Heart Failure, Diagnosis  Heart failure means that your heart is not able to pump blood in the right way. This makes it hard for your body to work well. Heart failure is usually a long-term (chronic) condition. You must take good care of yourself and follow your treatment plan from your doctor. What are the causes?  High blood pressure.  Buildup of cholesterol and fat in the arteries.  Heart attack. This injures the heart muscle.  Heart valves that do not open and close properly.  Damage of the heart muscle. This is also called cardiomyopathy.  Infection of the heart muscle. This is also called myocarditis.  Lung disease. What increases the risk?  Getting older. The risk of heart failure goes up as a person ages.  Being overweight.  Being female.  Use tobacco or nicotine products.  Abusing alcohol or drugs.  Having taken medicines that can damage the heart.  Having any of these conditions: ? Diabetes. ? Abnormal heart rhythms. ? Thyroid problems. ? Low blood counts (anemia).  Having a family history of heart failure. What are the signs or symptoms?  Shortness of breath.  Coughing.  Swelling of the feet, ankles, legs, or belly.  Losing or gaining weight for no reason.  Trouble breathing.  Waking from sleep because of the need to sit up and get more air.  Fast heartbeat.  Being very tired.  Feeling dizzy, or feeling like you may pass out (faint).  Having no desire to eat.  Feeling like you may vomit (nauseous).  Peeing (urinating) more at night.  Feeling confused. How is this treated? This condition may be treated with:  Medicines. These can be given to treat blood pressure and to make the heart muscles stronger.  Changes in your daily life. These may include: ? Eating a healthy diet. ? Staying at a healthy body weight. ? Quitting tobacco, alcohol, and drug use. ? Doing exercises. ? Participating in a cardiac rehabilitation program. This program  helps you improve your health through exercise, education, and counseling.  Surgery. Surgery can be done to open blocked valves, or to put devices in the heart, such as pacemakers.  A donor heart (heart transplant). You will receive a healthy heart from a donor. Follow these instructions at home:  Treat other conditions as told by your doctor. These may include high blood pressure, diabetes, thyroid disease, or abnormal heart rhythms.  Learn as much as you can about heart failure.  Get support as you need it.  Keep all follow-up visits. Summary  Heart failure means that your heart is not able to pump blood in the right way.  This condition is often caused by high blood pressure, heart attack, or damage of the heart muscle.  Symptoms of this condition include shortness of breath and swelling of the feet, ankles, legs, or belly. You may also feel very tired or feel like you may vomit.  You may be treated with medicines, surgery, or changes in your daily life.  Treat other health conditions as told by your doctor. This information is not intended to replace advice given to you by your health care provider. Make sure you discuss any questions you have with your health care provider. Document Revised: 09/04/2019 Document Reviewed: 09/04/2019 Elsevier Patient Education  2021 Elsevier Inc.  Suy tim, Ch?n ?on Heart Failure, Diagnosis  Suy tim l tnh tr?ng tim c v?n ?? v? b?m mu. ?i?u ny c th? c ngh?a l tim khng th? b?m ?? mu  ra kh?p c? th? ho?c tim khng ???c c?p ?? mu. ??i v?i m?t s? ng??i b? suy tim, d?ch c th? quay ng??c tr? l?i ph?i. C?ng c th? b? s?ng (ph) ? c?ng chn. Suy tim th??ng l tnh tr?ng lu di (m?n tnh). ?i?u quan tr?ng ??i v?i qu v? l ph?i t? ch?m [place] t?t cho b?n thn v th?c hi?n theo k? ho?ch ?i?u tr? c?a chuyn gia ch?m [place] s?c kh?e. Nguyn nhn g gy ra? Tnh tr?ng ny c th? l do:  Huy?t p cao (t?ng huy?t p). T?ng huy?t p lm cho c? tim lm  vi?c nhi?u h?n bnh th??ng.  B?nh ??ng m?ch vnh, hay CAD. CAD l s? tch t? cholesterol v m? (m?ng bm) trong ??ng m?ch c?a tim.  C?n ?au tim, cn ???c g?i l nh?i mu c? tim. ?i?u ny lm c? tim t?n th??ng, khi?n tim kh b?m mu.  Van tim b?t th??ng. Zenaida Niece tim khng m? v ?ng chnh xc, b?t bu?c tim ph?i b?m m?nh h?n ?? duy tr dng mu.  B?nh c? tim, vim ho?c nhi?m trng (b?nh c? tim ho?c vim c? tim). ?y l t?n th??ng cho c? tim. N c th? lm t?ng nguy c? suy tim.  B?nh ph?i. Tim lm vi?c nhi?u h?n khi ph?i khng kh?e m?nh. ?i?u g lm t?ng nguy c?? Nguy c? suy tim t?ng theo ?? tu?i. Tnh tr?ng ny c?ng hay x?y ra h?n ? nh?ng ng??i:  Bo ph.  La? nam gi??i.  S? d?ng thu?c l ho?c cc s?n ph?m c nicotine.  L?m d?ng r??u ho?c ma ty.  ? dng thu?c c th? gy t?n th??ng tim, ch?ng h?n nh? thu?c dng ?? ha tr? li?u.  C b?t k? tnh tr?ng no sau ?y: ? Ti?u ???ng. ? Nh?p tim b?t th??ng. ? Cc v?n ?? c?a tuy?n gip. ? S? l??ng h?ng c?u th?p (b?nh thi?u mu). ? B?nh th?n m?n tnh.  C ti?n s? gia ?nh b? suy tim. C cc d?u hi?u ho?c tri?u ch?ng g? Nh?ng tri?u ch?ng c?a tnh tr?ng ny bao g?m:  Kh th? khi ho?t ??ng, ch?ng h?n nh? leo c?u thang.  Ho khng ??.  S?ng n? ? bn chn, c? chn, chn ho?c b?ng.  T?ng ho?c gi?m cn khng r nguyn nhn.  Kh th? khi n?m th?ng.  T?nh gi?c trong khi ng? v c?n ph?i ng?i d?y v ht th? thm khng kh.  Nh?p ??p c?a tim nhanh.  M?t (m?t m?i) v m?t s?c.  C?m gic chong vng, chng m?t ho?c g?n ng?t.  Bu?n nn ho?c chn ?n.  Th?c d?y th??ng xuyn h?n trong ?m ?? ti?u ti?n (ti?u ti?n ?m).  L l?n. Ch?n ?on tnh tr?ng ny nh? th? no? Tnh tr?ng ny ???c ch?n ?on d?a vo:  Cc tri?u ch?ng, b?nh s? v khm th?c th? c?a qu v?.  Ki?m tra ch?n ?on, c th? bao g?m: ? Siu m tim. ? ?i?n tm ?? (ECG). ? Ch?p X-quang ng?c. ? Xt nghi?m mu. ? Nghi?m php g?ng s?c b?ng th? d?c. ? Ch?p MRI (ch?p c?ng h??ng  t?) tim. ? ??t ?ng thng tim v ch?p m?ch. ? Ch?p qut h?t nhn phng x?. Tnh tr?ng ny ???c ?i?u tr? nh? th? no? ?i?u tr? ti?nh tra?ng ny l nh?m x? tr cc tri?u ch?ng suy tim. Thu?c ?i?u tr? c th? bao g?m cc lo?i thu?c:  Gip gi?m huy?t p b?ng cch th? gin (lm gin) m?ch mu. Nh?ng thu?c ny ???c  g?i l cc thu?c ?c ch? ACE (men chuy?n angiotensin) v ARB (thu?c ch?n th? th? angiotensin) ho?c thu?c gin m?ch.  Lm cho th?n lo?i b? mu?i v n??c ra kh?i mu thng qua ti?u ti?n (thu?c l?i ti?u).  C?i thi?n s?c b?n c?a c? tim v ng?n khng cho tim ??p qu nhanh (thu?c ch?n beta).  T?ng l?c c?a nh?p tim (digoxin).  Lm ch?m nh?p tim. M?t s? lo?i thu?c tr? ti?u ???ng (thu?c ?c ch? SGLT-2) c?ng c th? ???c s? d?ng trong ?i?u tr?. Thay ??i hnh vi lnh m?nh ?i?u tr? c?ng c th? bao g?m cc thay ??i l?i s?ng lnh m?nh, ch?ng h?n nh?:  H??ng ??n v duy tr cn n?ng c l?i cho s?c kh?e.  Khng s? d?ng thu?c l ho?c cc s?n ph?m c nicotine.  ?n th?c ?n t?t cho tim.  H?n ch? ho?c trnh u?ng r??u.  D?ng s? d?ng ma ty b?t h?p php.  Duy tr ho?t ??ng th? ch?t.  Tham gia vo m?t ch??ng trnh ph?c h?i ch?c n?ng tim l m?t ch??ng trnh ?i?u tr? ?? c?i thi?n s?c kh?e v h?nh phc c?a qu v? thng qua qu trnh hu?n luy?n, h??ng d?n v t? v?n t?p luy?n. Cc ?i?u tr? khc Nh?ng ph??ng php ?i?u tr? khc c th? bao g?m:  Cc th? thu?t ?? m? ??ng m?ch b? t?c ho?c ph?c h?i van b? th??ng t?n.  ??t my t?o nh?p tim ?? c?i thi?n ch?c n?ng tim (li?u php ti ??ng b? tim).  ??t m?t thi?t b? ?? ?i?u tr? nh?p tim b?t th??ng nghim tr?ng (my kh? rung tim c?y ghp, hay ICD).  ??t m?t thi?t b? ?? c?i thi?n kh? n?ng b?m c?a tim (thi?t b? h? tr? tm th?t tri, hay LVAD).  Nh?n tim t? ng??i hi?n t?ng (c?y ghp tim). ?i?u ny ???c th?c hi?n khi cc ph??ng php ?i?u tr? khc khng c tc d?ng. Tun th? nh?ng h??ng d?n ny ? nh:  Qu?n l cc tnh tr?ng s?c kh?e khc theo ch? d?n c?a chuyn gia  ch?m [place] s?c kh?e. Nh?ng tnh tr?ng ny c th? bao g?m t?ng huy?t p, ti?u ???ng, b?nh tuy?n gip, ho?c nh?p tim b?t th??ng.  Ti?p t?c theo ch??ng trnh gio d?c v h? tr? lin t?c khi c?n. Tm hi?u cng nhi?u cng t?t v? suy tim.  Tun th? theo t?t c? cc l?n khm l?i. ?i?u ny c vai tr quan tr?ng. Tm t?t  Suy tim l tnh tr?ng tim c v?n ?? v? b?m mu.  Tnh tr?ng ny th??ng do huy?t p cao v cc b?nh khc c?a tim v ph?i gy ra.  Cc tri?u ch?ng c?a tnh tr?ng ny bao g?m kh th?, m?t (m?t m?i), bu?n nn v s?ng bn chn, c? chn, chn ho?c b?ng.  ?i?u tr? tnh tr?ng ny c th? bao g?m cc lo?i thu?c, thay ??i l?i s?ng v ph?u thu?t.  Qu?n l cc tnh tr?ng s?c kh?e khc theo ch? d?n c?a chuyn gia ch?m [place] s?c kh?e. Thng tin ny khng nh?m m?c ?ch thay th? cho l?i khuyn m chuyn gia ch?m [place] s?c kh?e ni v?i qu v?. Hy b?o ??m qu v? ph?i th?o lu?n b?t k? v?n ?? g m qu v? c v?i chuyn gia ch?m [place] s?c kh?e c?a qu v?. Document Revised: 10/01/2019 Document Reviewed: 10/01/2019 Elsevier Patient Education  2021 [employer].

## 2020-06-27 NOTE — Progress Notes (Addendum)
Heart Failure Patient Advocate Encounter   Received notification from [employer] that prior authorization for [place] is required.   PA submitted on CoverMyMeds Key [id number] Status is approved through 06/27/20 PA approval number [id number]    Sharen Hones, PharmD, BCPS Heart Failure Stewardship Pharmacist Phone [phone]  Please check [email or web address] for unit-specific pharmacist phone numbers

## 2020-06-27 NOTE — Progress Notes (Signed)
Heart Failure Stewardship Pharmacist Progress Note   PCP: Jackie Plum, MD PCP-Cardiologist: Jodelle Red, MD    HPI:  87 yo F with PMH of HTN, HLD, prior stroke, and MI. He presented to the ED on 06/21/20 with LE edema and fatigue. She has been admitted for new onset CHF. An ECHO was done on 06/22/20 and LVEF was 60-65%. CXR on 06/21/20 with mild CHF. Repeat CXR on 06/25/20 shows airspace opacity likely representing pneumonia, but with improved CHF and clear lungs elsewhere.   Discharge HF Medications: Furosemide 20 mg daily Carvedilol 3.125 mg BID Lisinopril 20 mg daily Jardiance 10 mg daily  Prior to admission HF Medications: Lisinopril 20 mg daily  Pertinent Lab Values: . Serum creatinine 0.99, BUN 33, Potassium 3.3, Sodium 134, BNP 550.4, Magnesium 1.9  Vital Signs: . Weight: 132 lbs (admission weight: 143 lbs) . Blood pressure: 110/60s  . Heart rate: 60-70s   Medication Assistance / Insurance Benefits Check: Does the patient have prescription insurance?  Yes Type of insurance plan: [place] Medicaid  Outpatient Pharmacy:  Prior to admission outpatient pharmacy: [facility] Is the patient willing to use [facility] TOC pharmacy at discharge? Yes Is the patient willing to transition their outpatient pharmacy to utilize a [facility] outpatient pharmacy?   Pending    Assessment: 1. Acute on chronic diastolic CHF (EF 00-76%), due to presumed NICM. NYHA class II symptoms. - Continue furosemide 20 mg daily at discharge - Continue carvedilol 3.125 mg BID - Continue lisinopril 20 mg daily - Agree with starting Jardiance 10 mg dailly   Plan: 1) Medication changes recommended at this time: - Discharge today  2) Patient assistance: - Patient has Medicaid - all prescriptions should be $0-3 each per month   Sharen Hones, PharmD, BCPS Heart Failure Stewardship Pharmacist Phone [phone]

## 2020-06-27 NOTE — Progress Notes (Signed)
Patient discharge instructions explained via translator to husband of patient and patient. IV removal well tolerated. Heart monitor removed.

## 2020-06-27 NOTE — Plan of Care (Signed)

## 2020-06-28 LAB — CA 125: Cancer Antigen (CA) 125: 91.3 U/mL — ABNORMAL HIGH (ref 0.0–38.1)

## 2020-07-24 ENCOUNTER — Encounter (HOSPITAL_COMMUNITY): Payer: Self-pay | Admitting: Emergency Medicine

## 2020-07-24 ENCOUNTER — Other Ambulatory Visit: Payer: Self-pay

## 2020-07-24 ENCOUNTER — Emergency Department (HOSPITAL_COMMUNITY): Payer: Medicaid Other

## 2020-07-24 ENCOUNTER — Emergency Department (HOSPITAL_COMMUNITY)
Admission: EM | Admit: 2020-07-24 | Discharge: 2020-07-24 | Disposition: A | Discharge status: Home / Self Care | Payer: Medicaid Other | Attending: Emergency Medicine | Admitting: Emergency Medicine

## 2020-07-24 DIAGNOSIS — I110 Hypertensive heart disease with heart failure: Secondary | ICD-10-CM | POA: Diagnosis not present

## 2020-07-24 DIAGNOSIS — I2510 Atherosclerotic heart disease of native coronary artery without angina pectoris: Secondary | ICD-10-CM | POA: Diagnosis not present

## 2020-07-24 DIAGNOSIS — I5033 Acute on chronic diastolic (congestive) heart failure: Secondary | ICD-10-CM | POA: Insufficient documentation

## 2020-07-24 DIAGNOSIS — M791 Myalgia, unspecified site: Secondary | ICD-10-CM | POA: Diagnosis present

## 2020-07-24 DIAGNOSIS — Z79899 Other long term (current) drug therapy: Secondary | ICD-10-CM | POA: Diagnosis not present

## 2020-07-24 DIAGNOSIS — Z7982 Long term (current) use of aspirin: Secondary | ICD-10-CM | POA: Insufficient documentation

## 2020-07-24 HISTORY — DX: Heart failure, unspecified: I50.9

## 2020-07-24 LAB — CBC
HCT: 31.3 % — ABNORMAL LOW (ref 36.0–46.0)
Hemoglobin: 10.9 g/dL — ABNORMAL LOW (ref 12.0–15.0)
MCH: 27.1 pg (ref 26.0–34.0)
MCHC: 34.8 g/dL (ref 30.0–36.0)
MCV: 77.9 fL — ABNORMAL LOW (ref 80.0–100.0)
Platelets: 149 10*3/uL — ABNORMAL LOW (ref 150–400)
RBC: 4.02 MIL/uL (ref 3.87–5.11)
RDW: 18.4 % — ABNORMAL HIGH (ref 11.5–15.5)
WBC: 6.5 10*3/uL (ref 4.0–10.5)
nRBC: 0 % (ref 0.0–0.2)

## 2020-07-24 LAB — HEPATIC FUNCTION PANEL
ALT: 48 U/L — ABNORMAL HIGH (ref 0–44)
AST: 88 U/L — ABNORMAL HIGH (ref 15–41)
Albumin: 2 g/dL — ABNORMAL LOW (ref 3.5–5.0)
Alkaline Phosphatase: 78 U/L (ref 38–126)
Bilirubin, Direct: 0.2 mg/dL (ref 0.0–0.2)
Indirect Bilirubin: 0.5 mg/dL (ref 0.3–0.9)
Total Bilirubin: 0.7 mg/dL (ref 0.3–1.2)
Total Protein: 6.1 g/dL — ABNORMAL LOW (ref 6.5–8.1)

## 2020-07-24 LAB — BRAIN NATRIURETIC PEPTIDE: B Natriuretic Peptide: 629.1 pg/mL — ABNORMAL HIGH (ref 0.0–100.0)

## 2020-07-24 LAB — BASIC METABOLIC PANEL
Anion gap: 4 — ABNORMAL LOW (ref 5–15)
BUN: 23 mg/dL (ref 8–23)
CO2: 25 mmol/L (ref 22–32)
Calcium: 7.4 mg/dL — ABNORMAL LOW (ref 8.9–10.3)
Chloride: 105 mmol/L (ref 98–111)
Creatinine, Ser: 0.85 mg/dL (ref 0.44–1.00)
GFR, Estimated: >60 mL/min (ref >60)
Glucose, Bld: 102 mg/dL — ABNORMAL HIGH (ref 70–99)
Potassium: 4 mmol/L (ref 3.5–5.1)
Sodium: 134 mmol/L — ABNORMAL LOW (ref 135–145)

## 2020-07-24 MED ORDER — FUROSEMIDE 10 MG/ML IJ SOLN
40.0000 mg | Freq: Once | INTRAMUSCULAR | Status: AC
  Administered 2020-07-24: 40 mg via INTRAVENOUS
  Filled 2020-07-24: qty 4

## 2020-07-24 NOTE — ED Triage Notes (Signed)
Pt c/o swelling to bilateral lower extremities and feeling tired x 3 days.  Admitted 4/27 fpr CHF. Denies pain and SOB.

## 2020-07-24 NOTE — Discharge Instructions (Addendum)
Increase your Lasix/furosemide from 20 mg once per day to 20 mg twice per day.  Do this for the next 5 days.  Call your doctor tomorrow.  If you develop new or worsening chest pain, shortness of breath, worsening leg swelling, or any other new/concerning symptoms then return to the ER for evaluation.  T?ng Lasix / furosemide c?a b?n t? 20 mg m?t l?n m?i ngy ln 20 mg hai l?n m?i ngy. Lm ?i?u ny trong 5 ngy ti?p theo. G?i cho bc s? c?a b?n vo ngy mai.  N?u b?n pht tri?n c?n ?au ng?c m?i ho?c tr?m tr?ng h?n, kh th?, ph chn ngy cng tr?m tr?ng, ho?c b?t k? tri?u ch?ng m?i / lin quan no khc, hy quay l?i Phng khm c?p c?u ?? ?nh gi.

## 2020-07-24 NOTE — ED Provider Notes (Signed)
[facility] EMERGENCY DEPARTMENT Provider Note   CSN: [id number] Arrival date & time: 07/24/20  9518     History No chief complaint on file.   Erin Patterson is a 87 y.o. female.  HPI 87 year old female presents with leg swelling.  She was admitted last month for the same.  Got out at the beginning of this month.  She has been taking Lasix as prescribed.  However she has developed recurrent swelling in her bilateral legs to at least her thighs.  No abdominal pain or swelling.  It is noted that she is here for "vomiting" but this the patient denies.  She has no fever, cough, or chest pain. Mild fatigue and mild dyspnea.  Past Medical History:  Diagnosis Date  . CHF (congestive heart failure) (HCC)   . Hypercholesteremia   . Hypertension   . MI (myocardial infarction) (HCC)   . Stroke (cerebrum) (HCC)    years ago--episode of "bleeding from right ear and onset of weakness"     Patient Active Problem List   Diagnosis Date Noted  . Anasarca   . New onset of congestive heart failure (HCC) 06/21/2020  . Abnormal LFTs 08/11/2017  . Embolic stroke (HCC) 07/30/2017  . Thrombocytopenia (HCC)   . Essential hypertension   . Sleep disturbance   . Dyslipidemia   . H/O: stroke   . Intracranial vascular stenosis   . Ischemic cerebrovascular accident (CVA) (HCC)   . Benign essential HTN   . AKI (acute kidney injury) (HCC)   . Cerebrovascular accident (CVA) due to embolism of cerebral artery (HCC)   . Hypophosphatemia   . Elevated brain natriuretic peptide (BNP) level   . Cardiomegaly   . Hypertensive urgency   . Leg cramp   . Dizziness 07/26/2017  . Weakness   . Hypokalemia   . Headache 12/10/2014  . Fever 12/10/2014  . Vision loss 12/10/2014  . HLD (hyperlipidemia) 12/10/2014  . MI (myocardial infarction) (HCC) 12/10/2014  . CAD (coronary artery disease) 12/10/2014    Past Surgical History:  Procedure Laterality Date  . TEE WITHOUT CARDIOVERSION N/A  07/30/2017   Procedure: TRANSESOPHAGEAL ECHOCARDIOGRAM (TEE);  Surgeon: Laurey Morale, MD;  Location: [facility] ENDOSCOPY;  Service: Cardiovascular;  Laterality: N/A;     OB History   No obstetric history on file.     Family History  Problem Relation Age of Onset  . Hypertension Neg Hx   . Stroke Neg Hx     Social History   Tobacco Use  . Smoking status: Never Smoker  . Smokeless tobacco: Never Used  Vaping Use  . Vaping Use: Never used  Substance Use Topics  . Alcohol use: Never  . Drug use: Never    Home Medications Prior to Admission medications   Medication Sig Start Date End Date Taking? Authorizing Provider  aspirin 81 MG EC tablet Take 1 tablet (81 mg total) by mouth daily. 06/27/20   Mikhail, Nita Sells, DO  atorvastatin (LIPITOR) 40 MG tablet Take 1 tablet (40 mg total) by mouth daily at 6 PM. 06/27/20   Edsel Petrin, DO  carvedilol (COREG) 3.125 MG tablet Take 1 tablet (3.125 mg total) by mouth 2 (two) times daily with a meal. 06/27/20   Mikhail, Nita Sells, DO  empagliflozin (JARDIANCE) 10 MG TABS tablet Take 1 tablet (10 mg total) by mouth daily before breakfast. 06/27/20   Edsel Petrin, DO  furosemide (LASIX) 20 MG tablet Take 1 tablet (20 mg total) by mouth daily. 06/27/20  06/27/21  Mikhail, Nita Sells, DO  lisinopril (ZESTRIL) 20 MG tablet Take 1 tablet (20 mg total) by mouth daily. 06/28/20   Edsel Petrin, DO    Allergies    Patient has no known allergies.  Review of Systems   Review of Systems  Constitutional: Negative for fever.  Respiratory: Negative for cough and shortness of breath.   Cardiovascular: Positive for leg swelling. Negative for chest pain.  Gastrointestinal: Negative for abdominal pain and vomiting.  All other systems reviewed and are negative.   Physical Exam Updated Vital Signs BP (!) 158/71   Pulse 70   Temp 98.6 F (37 C) (Oral)   Resp 20   SpO2 100%   Physical Exam Vitals and nursing note reviewed.  Constitutional:      General: She  is not in acute distress.    Appearance: She is well-developed. She is not ill-appearing or diaphoretic.  HENT:     Head: Normocephalic and atraumatic.     Right Ear: External ear normal.     Left Ear: External ear normal.     Nose: Nose normal.  Eyes:     General:        Right eye: No discharge.        Left eye: No discharge.  Cardiovascular:     Rate and Rhythm: Normal rate and regular rhythm.     Heart sounds: Normal heart sounds.  Pulmonary:     Effort: Pulmonary effort is normal.     Breath sounds: Examination of the right-lower field reveals decreased breath sounds. Examination of the left-lower field reveals decreased breath sounds. Decreased breath sounds present.  Abdominal:     Palpations: Abdomen is soft.     Tenderness: There is no abdominal tenderness.  Musculoskeletal:     Right lower leg: Edema present.     Left lower leg: Edema present.     Comments: Significant pitting edema to bilateral legs up to knees  Skin:    General: Skin is warm and dry.  Neurological:     Mental Status: She is alert.  Psychiatric:        Mood and Affect: Mood is not anxious.     ED Results / Procedures / Treatments   Labs (all labs ordered are listed, but only abnormal results are displayed) Labs Reviewed  BASIC METABOLIC PANEL - Abnormal; Notable for the following components:      Result Value   Sodium 134 (*)    Glucose, Bld 102 (*)    Calcium 7.4 (*)    Anion gap 4 (*)    All other components within normal limits  CBC - Abnormal; Notable for the following components:   Hemoglobin 10.9 (*)    HCT 31.3 (*)    MCV 77.9 (*)    RDW 18.4 (*)    Platelets 149 (*)    All other components within normal limits  BRAIN NATRIURETIC PEPTIDE - Abnormal; Notable for the following components:   B Natriuretic Peptide 629.1 (*)    All other components within normal limits  HEPATIC FUNCTION PANEL - Abnormal; Notable for the following components:   Total Protein 6.1 (*)    Albumin 2.0  (*)    AST 88 (*)    ALT 48 (*)    All other components within normal limits    EKG EKG Interpretation  Date/Time:  Monday Jul 24 2020 07:34:22 EDT Ventricular Rate:  75 PR Interval:  148 QRS Duration: 76 QT Interval:  406  QTC Calculation: 453 R Axis:   -14 Text Interpretation: Sinus rhythm with Premature supraventricular complexes Possible Anterior infarct , age undetermined Abnormal ECG Confirmed by Pricilla Loveless [phone]) on 07/24/2020 7:51:19 AM   Radiology DG Chest 2 View  Result Date: 07/24/2020 CLINICAL DATA:  Lower extremity edema.  Fatigue.  History of CHF. EXAM: CHEST - 2 VIEW COMPARISON:  06/26/2020 and older exams. FINDINGS: Mild enlargement of the cardiopericardial silhouette, stable. No mediastinal or hilar masses. No evidence of adenopathy. Bilateral interstitial thickening with thickening of the fissures. Additional opacity at the left lung base consistent with atelectasis. Small bilateral pleural effusions. No pneumothorax. Skeletal structures are demineralized but grossly intact. IMPRESSION: 1. Findings consistent with congestive heart failure with interstitial edema and small pleural effusions. Electronically Signed   By: Amie Portland M.D.   On: 07/24/2020 08:27    Procedures Procedures   Medications Ordered in ED Medications  furosemide (LASIX) injection 40 mg (40 mg Intravenous Given 07/24/20 0936)    ED Course  I have reviewed the triage vital signs and the nursing notes.  Pertinent labs & imaging results that were available during my care of the patient were reviewed by me and considered in my medical decision making (see chart for details).    MDM Rules/Calculators/A&P                          Patient appears well.  I discussed her work-up and findings through the [place] interpreter.  She states she is a little short of breath but she was able to ambulate to the bathroom and ambulate without dropping her sats or appearing increased work of  breathing.  At this point, I think is reasonable to increase her Lasix which is currently at a low dose of 20 mg once per day and refer her to the heart failure at home program.  Follow-up closely with PCP.  She has no chest pain.  Will discharge home with return precautions. Final Clinical Impression(s) / ED Diagnoses Final diagnoses:  Acute on chronic diastolic congestive heart failure [facility])    Rx / DC Orders ED Discharge Orders    None       Pricilla Loveless, MD 07/24/20 1205

## 2020-08-03 ENCOUNTER — Encounter (HOSPITAL_COMMUNITY): Payer: Self-pay | Admitting: Emergency Medicine

## 2020-08-03 ENCOUNTER — Inpatient Hospital Stay (HOSPITAL_COMMUNITY): Payer: Medicaid Other

## 2020-08-03 ENCOUNTER — Inpatient Hospital Stay (HOSPITAL_COMMUNITY)
Admission: EM | Admit: 2020-08-03 | Discharge: 2020-08-11 | DRG: 291 | Disposition: A | Discharge status: Home / Self Care | Payer: Medicaid Other | Attending: Internal Medicine | Admitting: Internal Medicine

## 2020-08-03 ENCOUNTER — Emergency Department (HOSPITAL_COMMUNITY): Payer: Medicaid Other

## 2020-08-03 DIAGNOSIS — Z8673 Personal history of transient ischemic attack (TIA), and cerebral infarction without residual deficits: Secondary | ICD-10-CM

## 2020-08-03 DIAGNOSIS — T383X6A Underdosing of insulin and oral hypoglycemic [antidiabetic] drugs, initial encounter: Secondary | ICD-10-CM | POA: Diagnosis present

## 2020-08-03 DIAGNOSIS — E8809 Other disorders of plasma-protein metabolism, not elsewhere classified: Secondary | ICD-10-CM | POA: Diagnosis present

## 2020-08-03 DIAGNOSIS — E7800 Pure hypercholesterolemia, unspecified: Secondary | ICD-10-CM | POA: Diagnosis present

## 2020-08-03 DIAGNOSIS — I252 Old myocardial infarction: Secondary | ICD-10-CM | POA: Diagnosis not present

## 2020-08-03 DIAGNOSIS — F0390 Unspecified dementia without behavioral disturbance: Secondary | ICD-10-CM | POA: Diagnosis present

## 2020-08-03 DIAGNOSIS — I5033 Acute on chronic diastolic (congestive) heart failure: Secondary | ICD-10-CM | POA: Diagnosis present

## 2020-08-03 DIAGNOSIS — N9489 Other specified conditions associated with female genital organs and menstrual cycle: Secondary | ICD-10-CM | POA: Diagnosis present

## 2020-08-03 DIAGNOSIS — E785 Hyperlipidemia, unspecified: Secondary | ICD-10-CM | POA: Diagnosis present

## 2020-08-03 DIAGNOSIS — Z7982 Long term (current) use of aspirin: Secondary | ICD-10-CM

## 2020-08-03 DIAGNOSIS — Z9114 Patient's other noncompliance with medication regimen: Secondary | ICD-10-CM

## 2020-08-03 DIAGNOSIS — Z7189 Other specified counseling: Secondary | ICD-10-CM | POA: Diagnosis not present

## 2020-08-03 DIAGNOSIS — E875 Hyperkalemia: Secondary | ICD-10-CM | POA: Diagnosis present

## 2020-08-03 DIAGNOSIS — Z20822 Contact with and (suspected) exposure to covid-19: Secondary | ICD-10-CM | POA: Diagnosis present

## 2020-08-03 DIAGNOSIS — Z91128 Patient's intentional underdosing of medication regimen for other reason: Secondary | ICD-10-CM | POA: Diagnosis not present

## 2020-08-03 DIAGNOSIS — I509 Heart failure, unspecified: Secondary | ICD-10-CM | POA: Diagnosis not present

## 2020-08-03 DIAGNOSIS — N179 Acute kidney failure, unspecified: Secondary | ICD-10-CM | POA: Diagnosis present

## 2020-08-03 DIAGNOSIS — R601 Generalized edema: Secondary | ICD-10-CM

## 2020-08-03 DIAGNOSIS — D509 Iron deficiency anemia, unspecified: Secondary | ICD-10-CM | POA: Diagnosis present

## 2020-08-03 DIAGNOSIS — I2510 Atherosclerotic heart disease of native coronary artery without angina pectoris: Secondary | ICD-10-CM | POA: Diagnosis present

## 2020-08-03 DIAGNOSIS — Z79899 Other long term (current) drug therapy: Secondary | ICD-10-CM

## 2020-08-03 DIAGNOSIS — I10 Essential (primary) hypertension: Secondary | ICD-10-CM | POA: Diagnosis present

## 2020-08-03 DIAGNOSIS — N1831 Chronic kidney disease, stage 3a: Secondary | ICD-10-CM | POA: Diagnosis present

## 2020-08-03 DIAGNOSIS — N949 Unspecified condition associated with female genital organs and menstrual cycle: Secondary | ICD-10-CM | POA: Diagnosis not present

## 2020-08-03 DIAGNOSIS — R599 Enlarged lymph nodes, unspecified: Secondary | ICD-10-CM | POA: Diagnosis present

## 2020-08-03 DIAGNOSIS — I130 Hypertensive heart and chronic kidney disease with heart failure and stage 1 through stage 4 chronic kidney disease, or unspecified chronic kidney disease: Secondary | ICD-10-CM | POA: Diagnosis present

## 2020-08-03 DIAGNOSIS — I712 Thoracic aortic aneurysm, without rupture: Secondary | ICD-10-CM | POA: Diagnosis present

## 2020-08-03 DIAGNOSIS — Z515 Encounter for palliative care: Secondary | ICD-10-CM | POA: Diagnosis not present

## 2020-08-03 DIAGNOSIS — Z66 Do not resuscitate: Secondary | ICD-10-CM | POA: Diagnosis present

## 2020-08-03 DIAGNOSIS — I679 Cerebrovascular disease, unspecified: Secondary | ICD-10-CM

## 2020-08-03 DIAGNOSIS — R971 Elevated cancer antigen 125 [CA 125]: Secondary | ICD-10-CM | POA: Diagnosis present

## 2020-08-03 LAB — COMPREHENSIVE METABOLIC PANEL
ALT: 36 U/L (ref 0–44)
AST: 66 U/L — ABNORMAL HIGH (ref 15–41)
Albumin: 2 g/dL — ABNORMAL LOW (ref 3.5–5.0)
Alkaline Phosphatase: 64 U/L (ref 38–126)
Anion gap: 7 (ref 5–15)
BUN: 32 mg/dL — ABNORMAL HIGH (ref 8–23)
CO2: 26 mmol/L (ref 22–32)
Calcium: 7.4 mg/dL — ABNORMAL LOW (ref 8.9–10.3)
Chloride: 102 mmol/L (ref 98–111)
Creatinine, Ser: 1.51 mg/dL — ABNORMAL HIGH (ref 0.44–1.00)
GFR, Estimated: 33 mL/min — ABNORMAL LOW (ref >60)
Glucose, Bld: 113 mg/dL — ABNORMAL HIGH (ref 70–99)
Potassium: 4 mmol/L (ref 3.5–5.1)
Sodium: 135 mmol/L (ref 135–145)
Total Bilirubin: 0.7 mg/dL (ref 0.3–1.2)
Total Protein: 6.3 g/dL — ABNORMAL LOW (ref 6.5–8.1)

## 2020-08-03 LAB — CBC
HCT: 30.1 % — ABNORMAL LOW (ref 36.0–46.0)
Hemoglobin: 10.6 g/dL — ABNORMAL LOW (ref 12.0–15.0)
MCH: 26.8 pg (ref 26.0–34.0)
MCHC: 35.2 g/dL (ref 30.0–36.0)
MCV: 76.2 fL — ABNORMAL LOW (ref 80.0–100.0)
Platelets: 163 10*3/uL (ref 150–400)
RBC: 3.95 MIL/uL (ref 3.87–5.11)
RDW: 17.7 % — ABNORMAL HIGH (ref 11.5–15.5)
WBC: 7.3 10*3/uL (ref 4.0–10.5)
nRBC: 0 % (ref 0.0–0.2)

## 2020-08-03 LAB — RESP PANEL BY RT-PCR (FLU A&B, COVID) ARPGX2
Influenza A by PCR: NEGATIVE
Influenza B by PCR: NEGATIVE
SARS Coronavirus 2 by RT PCR: NEGATIVE

## 2020-08-03 LAB — BRAIN NATRIURETIC PEPTIDE: B Natriuretic Peptide: 315 pg/mL — ABNORMAL HIGH (ref 0.0–100.0)

## 2020-08-03 LAB — TROPONIN I (HIGH SENSITIVITY)
Troponin I (High Sensitivity): 20 ng/L — ABNORMAL HIGH (ref <18)
Troponin I (High Sensitivity): 15 ng/L (ref <18)

## 2020-08-03 MED ORDER — BISACODYL 5 MG PO TBEC: 5.0000 mg | DELAYED_RELEASE_TABLET | Freq: Every day | ORAL | Status: DC | PRN

## 2020-08-03 MED ORDER — ATORVASTATIN CALCIUM 40 MG PO TABS
40.0000 mg | ORAL_TABLET | Freq: Every day | ORAL | Status: DC
  Administered 2020-08-03 – 2020-08-10 (×8): 40 mg via ORAL
  Filled 2020-08-03 (×8): qty 1

## 2020-08-03 MED ORDER — ENOXAPARIN SODIUM 30 MG/0.3ML IJ SOSY
30.0000 mg | PREFILLED_SYRINGE | INTRAMUSCULAR | Status: DC
  Administered 2020-08-03 – 2020-08-06 (×4): 30 mg via SUBCUTANEOUS
  Filled 2020-08-03 (×4): qty 0.3

## 2020-08-03 MED ORDER — MORPHINE SULFATE (PF) 2 MG/ML IV SOLN: 2.0000 mg | INTRAVENOUS | Status: DC | PRN

## 2020-08-03 MED ORDER — ONDANSETRON HCL 4 MG PO TABS: 4.0000 mg | ORAL_TABLET | Freq: Four times a day (QID) | ORAL | Status: DC | PRN

## 2020-08-03 MED ORDER — ACETAMINOPHEN 650 MG RE SUPP: 650.0000 mg | Freq: Four times a day (QID) | RECTAL | Status: DC | PRN

## 2020-08-03 MED ORDER — FUROSEMIDE 10 MG/ML IJ SOLN
20.0000 mg | Freq: Two times a day (BID) | INTRAMUSCULAR | Status: DC
  Administered 2020-08-03 – 2020-08-04 (×2): 20 mg via INTRAVENOUS
  Filled 2020-08-03 (×2): qty 2

## 2020-08-03 MED ORDER — ACETAMINOPHEN 325 MG PO TABS: 650.0000 mg | ORAL_TABLET | Freq: Four times a day (QID) | ORAL | Status: DC | PRN

## 2020-08-03 MED ORDER — CARVEDILOL 3.125 MG PO TABS
3.1250 mg | ORAL_TABLET | Freq: Two times a day (BID) | ORAL | Status: DC
  Administered 2020-08-03 – 2020-08-11 (×16): 3.125 mg via ORAL
  Filled 2020-08-03 (×17): qty 1

## 2020-08-03 MED ORDER — DOCUSATE SODIUM 100 MG PO CAPS
100.0000 mg | ORAL_CAPSULE | Freq: Two times a day (BID) | ORAL | Status: DC
  Administered 2020-08-03 – 2020-08-11 (×15): 100 mg via ORAL
  Filled 2020-08-03 (×16): qty 1

## 2020-08-03 MED ORDER — SODIUM CHLORIDE 0.9% FLUSH
3.0000 mL | Freq: Two times a day (BID) | INTRAVENOUS | Status: DC
  Administered 2020-08-03 – 2020-08-11 (×11): 3 mL via INTRAVENOUS

## 2020-08-03 MED ORDER — OXYCODONE HCL 5 MG PO TABS
5.0000 mg | ORAL_TABLET | ORAL | Status: DC | PRN
Start: 2020-08-03 — End: 2020-08-11

## 2020-08-03 MED ORDER — ONDANSETRON HCL 4 MG/2ML IJ SOLN: 4.0000 mg | Freq: Four times a day (QID) | INTRAMUSCULAR | Status: DC | PRN

## 2020-08-03 MED ORDER — ASPIRIN EC 81 MG PO TBEC
81.0000 mg | DELAYED_RELEASE_TABLET | Freq: Every day | ORAL | Status: DC
  Administered 2020-08-04 – 2020-08-11 (×9): 81 mg via ORAL
  Filled 2020-08-03 (×9): qty 1

## 2020-08-03 MED ORDER — POLYETHYLENE GLYCOL 3350 17 G PO PACK: 17.0000 g | PACK | Freq: Every day | ORAL | Status: DC | PRN

## 2020-08-03 MED ORDER — HYDRALAZINE HCL 20 MG/ML IJ SOLN: 5.0000 mg | INTRAMUSCULAR | Status: DC | PRN

## 2020-08-03 MED ORDER — TRAZODONE HCL 50 MG PO TABS
25.0000 mg | ORAL_TABLET | Freq: Every evening | ORAL | Status: DC | PRN
  Administered 2020-08-09 – 2020-08-10 (×2): 25 mg via ORAL
  Filled 2020-08-03 (×3): qty 1

## 2020-08-03 NOTE — H&P (Signed)
History and Physical    Erin Patterson MVE:[id number] DOB: December 22, 1932 DOA: 08/03/2020  PCP: Jackie Plum, MD Consultants:  Myna Hidalgo - oncology; [facility] - cardiology; Katrinka Blazing - neurology Patient coming from:  Home - lives with husband; NOK: [place], [place], [phone]  Chief Complaint: LE edema  HPI: Erin Patterson is a 87 y.o. female with medical history significant of  chronic diastolic CHF; HTN; HLD; dementia; and CAD presenting with fatigue and LE edema.  She was previously admitted from 4/27-5/3 for the same.  She returned to the ER on 5/30 with similar symptoms and her Lasix was increased with plan for the home heart failure program.  She is noticing severe LE edema.  Her "whole body is swollen including my feet, my arms, my face."  She is very fatigued.  She does not think she breathes correctly and this makes her feel tired all the time.  No cough.  She takes medication daily for HTN and HLD.  She has been taking 2 pills a day of Lasix for maybe 3 days.    ED Course: Hospitalized at the beginning of the month for CHF.  Came back and had Lasix doubled.  Here with anasarca, AKI, B pleural effusions.  Creatinine now 0.85 -> 1.5.  Maybe overdiuresed.  Albumin 2.  Review of Systems: As per HPI; otherwise review of systems reviewed and negative.   Ambulatory Status:  Ambulates without assistance  COVID Vaccine Status:  Complete  Past Medical History:  Diagnosis Date   CHF (congestive heart failure) (HCC)    Hypercholesteremia    Hypertension    MI (myocardial infarction) (HCC)    Stroke (cerebrum) (HCC)    years ago--episode of "bleeding from right ear and onset of weakness"     Past Surgical History:  Procedure Laterality Date   TEE WITHOUT CARDIOVERSION N/A 07/30/2017   Procedure: TRANSESOPHAGEAL ECHOCARDIOGRAM (TEE);  Surgeon: Laurey Morale, MD;  Location: [facility] ENDOSCOPY;  Service: Cardiovascular;  Laterality: N/A;    Social History   Socioeconomic History    Marital status: Married    Spouse name: Not on file   Number of children: Not on file   Years of education: Not on file   Highest education level: Not on file  Occupational History   Not on file  Tobacco Use   Smoking status: Never   Smokeless tobacco: Never  Vaping Use   Vaping Use: Never used  Substance and Sexual Activity   Alcohol use: Never   Drug use: Never   Sexual activity: Not on file  Other Topics Concern   Not on file  Social History Narrative   Not on file   Social Determinants of Health   Financial Resource Strain: Not on file  Food Insecurity: Not on file  Transportation Needs: Not on file  Physical Activity: Not on file  Stress: Not on file  Social Connections: Not on file  Intimate Partner Violence: Not on file    No Known Allergies  Family History  Problem Relation Age of Onset   Hypertension Neg Hx    Stroke Neg Hx     Prior to Admission medications   Medication Sig Start Date End Date Taking? Authorizing Provider  aspirin 81 MG EC tablet Take 1 tablet (81 mg total) by mouth daily. 06/27/20   Mikhail, Nita Sells, DO  atorvastatin (LIPITOR) 40 MG tablet Take 1 tablet (40 mg total) by mouth daily at 6 PM. 06/27/20   Edsel Petrin, DO  carvedilol (COREG)  3.125 MG tablet Take 1 tablet (3.125 mg total) by mouth 2 (two) times daily with a meal. 06/27/20   Mikhail, Nita Sells, DO  empagliflozin (JARDIANCE) 10 MG TABS tablet Take 1 tablet (10 mg total) by mouth daily before breakfast. 06/27/20   Edsel Petrin, DO  furosemide (LASIX) 20 MG tablet Take 1 tablet (20 mg total) by mouth daily. 06/27/20 06/27/21  Mikhail, Nita Sells, DO  lisinopril (ZESTRIL) 20 MG tablet Take 1 tablet (20 mg total) by mouth daily. 06/28/20   Edsel Petrin, DO    Physical Exam: Vitals:   08/03/20 1300 08/03/20 1345 08/03/20 1407 08/03/20 1754  BP: 119/63  140/74 (!) 141/78  Pulse: (!) 58  60 69  Resp: 15  18 17   Temp:   97.8 F (36.6 C) 97.9 F (36.6 C)  TempSrc:   Oral Oral  SpO2:  99%  99% 97%  Weight:  66.6 kg       General:  Appears calm and comfortable and is in NAD Eyes:   EOMI, normal lids, iris ENT:  grossly normal hearing, lips & tongue, mmm Neck:  no LAD, masses or thyromegaly Cardiovascular:  RRR, no m/r/g.  Respiratory:   CTA bilaterally with no wheezes/rales/rhonchi.  Normal respiratory effort. Abdomen:  soft, NT, ND Skin:  chronic stasis changes to B LE Musculoskeletal:  grossly normal tone BUE/BLE, good ROM, no bony abnormality Lower extremity:  Diffuse 3-4+ LE edema, pitting to the thighs.  Psychiatric:  grossly normal mood and affect, speech fluent and appropriate via [employer] translator Neurologic:  CN 2-12 grossly intact, moves all extremities in coordinated fashion    Radiological Exams on Admission: Independently reviewed - see discussion in A/P where applicable  DG Chest Port 1 View  Result Date: 08/03/2020 CLINICAL DATA:  Weakness and dyspnea EXAM: PORTABLE CHEST 1 VIEW COMPARISON:  07/24/2020 FINDINGS: Cardiomegaly. Congested appearance of vessels with small pleural effusions greater on the left. Bulky mitral annular calcification. Negative for pneumothorax. Extensive artifact from EKG leads IMPRESSION: Vascular congestion and small pleural effusions. No significant change from 07/24/2020. Electronically Signed   By: 07/26/2020 M.D.   On: 08/03/2020 10:58    EKG: Independently reviewed.  NSR with rate 67; no evidence of acute ischemia   Labs on Admission: I have personally reviewed the available labs and imaging studies at the time of the admission.  Pertinent labs:   Glucose 113 BUN 32/Creatinine 1.51/GFR 33 - usually stage 3a CKD Albumin 2.0 AST 66/ALT 36 - improved from 5/30 BNP 315; 629.1 on 5/30 HS troponin 20, 15 WBC 7.3 Hgb 10.6 COVID/flu negative   Assessment/Plan Principal Problem:   Acute on chronic diastolic CHF (congestive heart failure) (HCC) Active Problems:   HLD (hyperlipidemia)   Benign essential HTN    AKI (acute kidney injury) (HCC)   Anasarca   Stage 3a chronic kidney disease (HCC)   Adnexal fullness    Lower extremity edema/anasarca, Diastolic CHF -Patient with no history of CHF -Echo during last hospitalization showed preserved EF and inability to determine diastolic function -Patient presented with lower extremity swelling as well as fatigue on 4/27 and was hospitalized through 5/3; had similar presentation to ER on 5/30 and was discharged; and returned today with the same -BNP today is actually lower than on prior occasions -Chest x-ray showed stable vascular congestion and small pleural effusions -She is still markedly volume overloaded with anasarca, but has such low albumin that she is third spacing and so is mildly intravascularly depleted -She has been  taking Lasix 40 mg BID at home -Will give IV Lasix 20 mg BID here -Hold Cardizem, as this may be contributing to volume overload -Place compression stockings -Cardiology consulted once again -Unfortunately, this may be a chronic issue for her related to hypoalbuminuria, poor nutrition, language barrier, and cognitive impairment -Will request palliative care consultation   Acute kidney injury on stage 3a CKD -Worse than during prior hospitalization, likely due to diuresis  -However, she is still volume overloaded so needs gentle ongoing diuresis and close monitoring -Hold Lisinopril, Jardiance for now -Repeat BMP in AM   Essential hypertension -Hold Lisinopril, Jardiance -Continue Coreg   History of CVA -Continue statin, aspirin  Ascending thoracic aortic enlargement -CT abd/pelvis showed 4.1 cm ascending thoracic aortic caliber -Recommend annual f/u by CTA or MRI   Enlarged lymph nodes/adnexal fullness -Noted on ultrasound of the lower extremities during last hospitalization -CT on 5/2 with mild fullness of B extrarenal pelves as well as fullness of L adnexa with cystic area -Elevated CA-125 on prior; will  repeat -Will order pelvis US for further evaluation  Cognitive impairment -Daughter reports that both of her parents are showing signs of cognitive impairment and y be unable to effectively care for themselves (her father also has a high LUE amputation) -TOC team consulted    Note: This patient has been tested and is negative for the novel coronavirus COVID-19. The patient has been fully vaccinated against COVID-19.   Level of care: Telemetry Cardiac DVT prophylaxis:  Lovenox  Code Status:  Full - confirmed with patient/family Family Communication: Husband was present throughout evaluation. Disposition Plan:  The patient is from: home  Anticipated d/c is to: be determined  Anticipated d/c date will depend on clinical response to treatment, likely several days  Patient is currently: acutely ill Consults called: Cardiology; Palliative care; [facility] team; nutrition; PT/OT  Admission status:  Admit - It is my clinical opinion that admission to INPATIENT is reasonable and necessary because of the expectation that this patient will require hospital care that crosses at least 2 midnights to treat this condition based on the medical complexity of the problems presented.  Given the aforementioned information, the predictability of an adverse outcome is felt to be significant.    Jonah Blue MD [facility]   How to contact the [facility] Attending or Consulting provider 7A - 7P or covering provider during after hours 7P -7A, for this patient?  Check the care team in [facility] and look for a) attending/consulting TR[facility] provider listed and b) the [facility] team listed Log into [email or web address] and use [place]'s universal password to access. If you do not have the password, please contact the hospital operator. Locate the [facility] provider you are looking for under [facility] and page to a number that you can be directly reached. If you still have difficulty reaching the provider, please page the [facility]  (Director on Call) for the Hospitalists listed on amion for assistance.   08/03/2020, 6:39 PM

## 2020-08-03 NOTE — ED Provider Notes (Signed)
[facility] EMERGENCY DEPARTMENT Provider Note   CSN: [id number] Arrival date & time: 08/03/20  2563     History Chief Complaint  Patient presents with   Fatigue    Erin Patterson is a 87 y.o. female.  HPI Level 5 caveat secondary to language barrier 87 year old female history of congestive heart failure, hypertension, MI, stroke, hyper cholesterolemia presents today complaining of dyspnea and weakness.  She was seen here 2 days ago with worsened CHF.  He had her Lasix increased.  She was to follow-up at the CHF clinic.  She presents today stating that she is having worsening episodes of feeling tired with some ongoing associated dyspnea.  She describes feeling tired, but is unable to be more specific with me.  This is worsened with any exertion and she feels weak and needs to sit down.  The dyspnea is worsened with exertion and with laying flat.  She reports taking her medications as prescribed.  She denies any fever, chills, cough.  She has had her COVID-vaccine x2 but does not seem to have had a booster.  Her primary care is [facility].  She has had ongoing anasarca but it is difficult to tell whether or not this is worsened from baseline.     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   Thrombocytopenia (HCC)    Essential hypertension    Sleep disturbance    Dyslipidemia    H/O: stroke    Intracranial vascular stenosis    Ischemic cerebrovascular accident (CVA) (HCC)    Benign essential HTN    AKI (acute kidney injury) (HCC)    Cerebrovascular accident (CVA) due to  embolism of cerebral artery (HCC)    Hypophosphatemia    Elevated brain natriuretic peptide (BNP) level    Cardiomegaly    Hypertensive urgency    Leg cramp    Dizziness 07/26/2017   Weakness    Hypokalemia    Headache 12/10/2014   Fever 12/10/2014   Vision loss 12/10/2014   HLD (hyperlipidemia) 12/10/2014   MI (myocardial infarction) (HCC) 12/10/2014   CAD (coronary artery disease) 12/10/2014    Past Surgical History:  Procedure Laterality Date   TEE WITHOUT CARDIOVERSION N/A 07/30/2017   Procedure: TRANSESOPHAGEAL ECHOCARDIOGRAM (TEE);  Surgeon: Laurey Morale, MD;  Location: [facility] ENDOSCOPY;  Service: Cardiovascular;  Laterality: N/A;     OB History   No obstetric history on file.     Family History  Problem Relation Age of Onset   Hypertension Neg Hx    Stroke Neg Hx     Social History   Tobacco Use   Smoking status: Never   Smokeless tobacco: Never  Vaping Use   Vaping Use: Never used  Substance Use Topics   Alcohol use: Never   Drug use: Never    Home Medications Prior to Admission medications   Medication Sig Start Date End Date Taking? Authorizing Provider  aspirin 81 MG EC tablet Take 1 tablet (81 mg total) by mouth daily. 06/27/20   Edsel Petrin,  DO  atorvastatin (LIPITOR) 40 MG tablet Take 1 tablet (40 mg total) by mouth daily at 6 PM. 06/27/20   Edsel Petrin, DO  carvedilol (COREG) 3.125 MG tablet Take 1 tablet (3.125 mg total) by mouth 2 (two) times daily with a meal. 06/27/20   Mikhail, Nita Sells, DO  empagliflozin (JARDIANCE) 10 MG TABS tablet Take 1 tablet (10 mg total) by mouth daily before breakfast. 06/27/20   Edsel Petrin, DO  furosemide (LASIX) 20 MG tablet Take 1 tablet (20 mg total) by mouth daily. 06/27/20 06/27/21  Mikhail, Nita Sells, DO  lisinopril (ZESTRIL) 20 MG tablet Take 1 tablet (20 mg total) by mouth daily. 06/28/20   Edsel Petrin, DO    Allergies    Patient has no known allergies.  Review of Systems   Review of Systems  All  other systems reviewed and are negative.  Physical Exam Updated Vital Signs BP (!) 111/44 (BP Location: Right Arm)   Pulse 72   Temp 98.9 F (37.2 C) (Oral)   Resp 15   SpO2 97%   Physical Exam Vitals and nursing note reviewed.  Constitutional:      Appearance: Normal appearance.  HENT:     Head: Normocephalic.     Right Ear: External ear normal.     Left Ear: External ear normal.     Nose: Nose normal.     Mouth/Throat:     Pharynx: Oropharynx is clear.  Eyes:     Pupils: Pupils are equal, round, and reactive to light.  Cardiovascular:     Rate and Rhythm: Normal rate and regular rhythm.  Pulmonary:     Breath sounds: Rhonchi present.  Abdominal:     Palpations: Abdomen is soft.     Comments: Induration bilateral upper flank  Musculoskeletal:        General: Swelling present.     Cervical back: Normal range of motion.     Comments: Swelling and induration bilateral lower extremities up through mid thighs  Skin:    General: Skin is warm and dry.  Neurological:     General: No focal deficit present.     Mental Status: She is alert.  Psychiatric:        Mood and Affect: Mood normal.        Behavior: Behavior normal.    ED Results / Procedures / Treatments   Labs (all labs ordered are listed, but only abnormal results are displayed) Labs Reviewed - No data to display  EKG EKG Interpretation  Date/Time:  Thursday August 03 2020 10:16:43 EDT Ventricular Rate:  67 PR Interval:  241 QRS Duration: 104 QT Interval:  447 QTC Calculation: 472 R Axis:   6 Text Interpretation: Sinus rhythm Prolonged PR interval Low voltage, precordial leads Confirmed by Margarita Grizzle [phone]) on 08/03/2020 10:50:27 AM  Radiology DG Chest Port 1 View  Result Date: 08/03/2020 CLINICAL DATA:  Weakness and dyspnea EXAM: PORTABLE CHEST 1 VIEW COMPARISON:  07/24/2020 FINDINGS: Cardiomegaly. Congested appearance of vessels with small pleural effusions greater on the left. Bulky mitral annular  calcification. Negative for pneumothorax. Extensive artifact from EKG leads IMPRESSION: Vascular congestion and small pleural effusions. No significant change from 07/24/2020. Electronically Signed   By: Marnee Spring M.D.   On: 08/03/2020 10:58    Procedures Procedures   Medications Ordered in ED Medications - No data to display  ED Course  I have reviewed the triage vital signs and the nursing notes.  Pertinent labs & imaging results that were  available during my care of the patient were reviewed by me and considered in my medical decision making (see chart for details).    MDM Rules/Calculators/A&P                         87 year old female who presents complaining of generalized weakness and dyspnea.  She has had a recent diagnosis of new onset diastolic CHF with lower extremity edema and anasarca.  The edema and anasarca appear to be worsening.  She now also has lower pleural effusions.  Labs reviewed and patient noted to have acute kidney injury with increase in creatinine from 0.85 on 07/25/2018 22-1.51 today.  Discussed patient's care with Dr. Ophelia Charter who will see for admission Final Clinical Impression(s) / ED Diagnoses Final diagnoses:  Anasarca  Congestive heart failure, unspecified HF chronicity, unspecified heart failure type (HCC)  AKI (acute kidney injury) [facility])    Rx / DC Orders ED Discharge Orders     None        Margarita Grizzle, MD 08/03/20 1241

## 2020-08-03 NOTE — ED Triage Notes (Signed)
Patient here with complaint of fatigue and lower extremity edema that started a few days ago, patient reports history of hypertension. Patient alert, oriented, and in no apparent distress at this time.

## 2020-08-03 NOTE — Consult Note (Addendum)
Cardiology Consultation:   Patient ID: Erin Patterson MRN: [id number]; DOB: Jan 02, 1933  Admit date: 08/03/2020 Date of Consult: 08/03/2020  PCP:  Erin Plum, MD   [facility] Providers Cardiologist:  Jodelle Red, MD     Patient Profile:   Erin Patterson is a 87 y.o. female with a hx of chronic diastolic heart failure, hypertension, hyperlipidemia, history of ischemic CVA, and chronic anemia  who is being seen 08/03/2020 for the evaluation of CHF at the request of Dr. Ophelia Charter.  Although previous note mention she had CAD s/p MI, however patient absolutely denies this.  She says she was evaluated several years ago in [place] for palpitation however never went through invasive procedures such as cardiac catheterization, nor was she ever told she had a heart attack.  She was first evaluated by Dr. Jodelle Red on 10/03/2017 as follow-up after her stroke in June 2019.  As part of her stroke eval, she had both TTE and TEE which showed moderate MV and AV calcification with normal EF.  Admitted 06/21/20-06/27/20 for new onset acute diastolic heart failure with anasarca.  Echocardiogram during admission showed LV function of 60 to 65%, no wall motion abnormality degenerative mitral valve disease with mild mitral regurgitation.  Patient diuresed well and discharged on lisinopril and Lasix. TSH 1.575, vitamin B12 level 867, vitamin D level (25-hydroxy) 31.12. CT abdomen pelvis showed mild to moderate anasarca without substantial ascites.  Left adnexal soft tissue fullness with small cystic area of the left adnexa as well.  Suggest follow-up pelvic sonogram for further evaluation.  Case was discussed with OB/GYN. Had lymph node enlargement on lower extremity Doppler.  CA125 was ordered prior to discharge which come back high as 91.3.   Came to ER 5/30 for LE swelling. BNP was 629.1. Scr was normal. Given lasix and discharged.   History of Present Illness:   [name] felt better after  last admission in ER evaluation however noted gradual worsening of lower extremity edema for past 5 days or so and chronic fatigue.  Unfortunately, she was doing better (no edema) , thus stopped her Lasix.  She states that she was only taking blood pressure medications.  However per h8p note "She has been taking 2 pills a day of Lasix for maybe 3 days". Language line used for interpretation.  She had intermittent shortness of breath with laying down.  Denies excess salt intake.  No chest pain or palpitations.  Her main complaint is fatigue.  She did not follow-up with PCP or OB/GYN as recommended during last admission.  BNP 315 (was 629 on 5/31) Bun/Scr 32/1.51 (was 23/0.85 on 5/31) Hs-troponin 20>>15 HGb 10.6 Albumin 2.0 Respiratory panel negative for COVID and influenza  Chest x- ray  with vascular congestion and small pleural effusions. No significant change from 07/24/2020.  Past Medical History:  Diagnosis Date   CHF (congestive heart failure) (HCC)    Hypercholesteremia    Hypertension    MI (myocardial infarction) (HCC)    Stroke (cerebrum) (HCC)    years ago--episode of "bleeding from right ear and onset of weakness"     Past Surgical History:  Procedure Laterality Date   TEE WITHOUT CARDIOVERSION N/A 07/30/2017   Procedure: TRANSESOPHAGEAL ECHOCARDIOGRAM (TEE);  Surgeon: Laurey Morale, MD;  Location: [facility] ENDOSCOPY;  Service: Cardiovascular;  Laterality: N/A;     Inpatient Medications: Scheduled Meds:  [START ON 08/04/2020] aspirin EC  81 mg Oral Daily   atorvastatin  40 mg Oral q1800  carvedilol  3.125 mg Oral BID WC   docusate sodium  100 mg Oral BID   enoxaparin (LOVENOX) injection  30 mg Subcutaneous Q24H   furosemide  20 mg Intravenous BID   sodium chloride flush  3 mL Intravenous Q12H   Continuous Infusions:  PRN Meds: acetaminophen **OR** acetaminophen, bisacodyl, hydrALAZINE, morphine injection, ondansetron **OR** ondansetron (ZOFRAN) IV, oxyCODONE,  polyethylene glycol, traZODone  Allergies:   No Known Allergies  Social History:   Social History   Socioeconomic History   Marital status: Married    Spouse name: Not on file   Number of children: Not on file   Years of education: Not on file   Highest education level: Not on file  Occupational History   Not on file  Tobacco Use   Smoking status: Never   Smokeless tobacco: Never  Vaping Use   Vaping Use: Never used  Substance and Sexual Activity   Alcohol use: Never   Drug use: Never   Sexual activity: Not on file  Other Topics Concern   Not on file  Social History Narrative   Not on file   Social Determinants of Health   Financial Resource Strain: Not on file  Food Insecurity: Not on file  Transportation Needs: Not on file  Physical Activity: Not on file  Stress: Not on file  Social Connections: Not on file  Intimate Partner Violence: Not on file    Family History:   Family History  Problem Relation Age of Onset   Hypertension Neg Hx    Stroke Neg Hx      ROS:  Please see the history of present illness.  All other ROS reviewed and negative.     Physical Exam/Data:   Vitals:   08/03/20 1130 08/03/20 1300 08/03/20 1345 08/03/20 1407  BP: 101/76 119/63  140/74  Pulse: 63 (!) 58  60  Resp: 18 15  18   Temp:    97.8 F (36.6 C)  TempSrc:    Oral  SpO2: 99% 99%  99%  Weight:   66.6 kg    No intake or output data in the 24 hours ending 08/03/20 1610 Last 3 Weights 08/03/2020 06/27/2020 06/26/2020  Weight (lbs) 146 lb 13.2 oz 132 lb 4.8 oz 135 lb  Weight (kg) 66.6 kg 60.011 kg 61.236 kg     Body mass index is 27.74 kg/m.  General:  Well nourished, well developed, in no acute distress HEENT: normal Lymph: no adenopathy Neck: no JVD Endocrine:  No thryomegaly Vascular: No carotid bruits; FA pulses 2+ bilaterally without bruits  Cardiac:  normal S1, S2; RRR; no murmur  Lungs:  Diminished breath sound with faint rales  Abd: soft, nontender, no hepatomegaly   Ext: 1-2+ BL LE edema Musculoskeletal:  No deformities, BUE and BLE strength normal and equal Skin: warm and dry  Neuro:  CNs 2-12 intact, no focal abnormalities noted Psych:  Normal affect   EKG:  The EKG was personally reviewed and demonstrates: SR with prolonged PR interval  Telemetry:  Telemetry was personally reviewed and demonstrates:  NSR  Relevant CV Studies:  Echo 06/22/20 1. Left ventricular ejection fraction, by estimation, is 60 to 65%. The  left ventricle has normal function. The left ventricle has no regional  wall motion abnormalities. There is moderate concentric left ventricular  hypertrophy. Left ventricular  diastolic function could not be evaluated.   2. Right ventricular systolic function is normal. The right ventricular  size is normal.   3. The  mitral valve is degenerative. Mild mitral valve regurgitation. No  evidence of mitral stenosis. Moderate to severe mitral annular  calcification.   4. The aortic valve is tricuspid. There is mild calcification of the  aortic valve. Aortic valve regurgitation is mild. No aortic stenosis is  present.   Laboratory Data:  High Sensitivity Troponin:   Recent Labs  Lab 08/03/20 1023 08/03/20 1216  TROPONINIHS 20* 15     Chemistry Recent Labs  Lab 08/03/20 1023  NA 135  K 4.0  CL 102  CO2 26  GLUCOSE 113*  BUN 32*  CREATININE 1.51*  CALCIUM 7.4*  GFRNONAA 33*  ANIONGAP 7    Recent Labs  Lab 08/03/20 1023  PROT 6.3*  ALBUMIN 2.0*  AST 66*  ALT 36  ALKPHOS 64  BILITOT 0.7   Hematology Recent Labs  Lab 08/03/20 1023  WBC 7.3  RBC 3.95  HGB 10.6*  HCT 30.1*  MCV 76.2*  MCH 26.8  MCHC 35.2  RDW 17.7*  PLT 163   BNP Recent Labs  Lab 08/03/20 1023  BNP 315.0*    Radiology/Studies:  DG Chest Port 1 View  Result Date: 08/03/2020 CLINICAL DATA:  Weakness and dyspnea EXAM: PORTABLE CHEST 1 VIEW COMPARISON:  07/24/2020 FINDINGS: Cardiomegaly. Congested appearance of vessels with small  pleural effusions greater on the left. Bulky mitral annular calcification. Negative for pneumothorax. Extensive artifact from EKG leads IMPRESSION: Vascular congestion and small pleural effusions. No significant change from 07/24/2020. Electronically Signed   By: Marnee Spring M.D.   On: 08/03/2020 10:58    Assessment and Plan:   Chronic diastolic CHF/ Anasarca  AKI Different story despite use of language line. Patient describes not taking Lasix for past few days however per H&P she was taking extra.  Albumin is low. BNP is better compared to 5/31 but worsen BUN/Scr suggesting over diuresis.  - Hold Home lisinopril - Continue Coreg - Will review diuresis with MD  3.  Enlarged lymph node/ Elevated CA 125 -Reviewed discharge note from last admission -Recommended Work-up this admission given language barrier and follow-up issue  4.  Chronic fatigue - Likely due to # 2 and chronic anemia   Risk Assessment/Risk Scores:    New York Heart Association (NYHA) Functional Class NYHA Class II     For questions or updates, please contact [facility] Please consult [email or web address] for contact info under    Lorelei Pont, PA  08/03/2020 4:10 PM

## 2020-08-03 NOTE — Evaluation (Signed)
Physical Therapy Evaluation Patient Details Name: Erin Patterson MRN: [id number] DOB: 1932/04/27 Today's Date: 08/03/2020   History of Present Illness  87 y.o. female presents to [facility] ED on 08/03/2020 with reports of dyspnea, weakness, and fatigue. Pt recently discharged from [facility] ED 2 days prior with similar symptoms. PMH includes anasarca, CHF, CVA, HTN, MI.  Clinical Impression  Pt presents to PT with deficits in activity tolerance, functional mobility, gait, and balance. Pt expresses SOB with limited mobility and is limited to household distances of ambulation at this time. Pt demonstrates a preference for unilateral UE support during gait to improve balance when mobilizing. Pt will benefit from assessment of gait with use of rollator next session to aide in improving activity tolerance.    Follow Up Recommendations No PT follow up    Equipment Recommendations  Other (comment) (possibly 4 wheeled walker with seat)    Recommendations for Other Services       Precautions / Restrictions Precautions Precautions: None Restrictions Weight Bearing Restrictions: No      Mobility  Bed Mobility Overal bed mobility: Independent                  Transfers Overall transfer level: Needs assistance Equipment used: None Transfers: Sit to/from Stand Sit to Stand: Supervision            Ambulation/Gait Ambulation/Gait assistance: Min guard Gait Distance (Feet): 60 Feet Assistive device: 1 person hand held assist Gait Pattern/deviations: Step-through pattern Gait velocity: reduced Gait velocity interpretation: 1.31 - 2.62 ft/sec, indicative of limited community ambulator General Gait Details: pt with slowed step-through gait, reduced gait speed  Stairs            Wheelchair Mobility    Modified Rankin (Stroke Patients Only)       Balance Overall balance assessment: Needs assistance Sitting-balance support: No upper extremity supported;Feet supported Sitting  balance-Leahy Scale: Good     Standing balance support: No upper extremity supported Standing balance-Leahy Scale: Fair                               Pertinent Vitals/Pain Pain Assessment: No/denies pain    Home Living Family/patient expects to be discharged to:: Private residence Living Arrangements: Spouse/significant other Available Help at Discharge: Family;Available 24 hours/day Type of Home: Apartment Home Access: Stairs to enter Entrance Stairs-Rails: Right Entrance Stairs-Number of Steps: 10 Home Layout: One level Home Equipment: Cane - single point      Prior Function Level of Independence: Independent         Comments: pt reports recent fatigue, even bed mobility or tansfers resulting in SOB.     Hand Dominance        Extremity/Trunk Assessment   Upper Extremity Assessment Upper Extremity Assessment: Overall WFL for tasks assessed    Lower Extremity Assessment Lower Extremity Assessment: LLE deficits/detail;RLE deficits/detail RLE Deficits / Details: significant BLE edema noted LLE Deficits / Details: significant BLE edema noted    Cervical / Trunk Assessment Cervical / Trunk Assessment: Normal  Communication   Communication: Prefers language other than [place] (cambodian interpreter utilized via Ipad)  Cognition Arousal/Alertness: Awake/alert Behavior During Therapy: WFL for tasks assessed/performed Overall Cognitive Status: Within Functional Limits for tasks assessed  General Comments General comments (skin integrity, edema, etc.): VSS on RA despite pt reports of SOB    Exercises     Assessment/Plan    PT Assessment Patient needs continued PT services  PT Problem List Decreased activity tolerance;Decreased balance;Cardiopulmonary status limiting activity       PT Treatment Interventions DME instruction;Gait training;Stair training;Therapeutic activities;Therapeutic  exercise;Balance training;Patient/family education    PT Goals (Current goals can be found in the Care Plan section)  Acute Rehab PT Goals Patient Stated Goal: to improve activity tolerance PT Goal Formulation: With patient Time For Goal Achievement: 08/17/20 Potential to Achieve Goals: Good Additional Goals Additional Goal #1: Pt will ambulate for >125', reporting a DOE of 3/10 or less to demonstrate improvements in activity tolerance    Frequency Min 3X/week   Barriers to discharge        Co-evaluation               AM-PAC PT "6 Clicks" Mobility  Outcome Measure Help needed turning from your back to your side while in a flat bed without using bedrails?: None Help needed moving from lying on your back to sitting on the side of a flat bed without using bedrails?: None Help needed moving to and from a bed to a chair (including a wheelchair)?: A Little Help needed standing up from a chair using your arms (e.g., wheelchair or bedside chair)?: A Little Help needed to walk in hospital room?: A Little Help needed climbing 3-5 steps with a railing? : A Little 6 Click Score: 20    End of Session   Activity Tolerance: Patient limited by fatigue Patient left: in bed;with call bell/phone within reach Nurse Communication: Mobility status PT Visit Diagnosis: Other abnormalities of gait and mobility (R26.89)    Time: 0160-1093 PT Time Calculation (min) (ACUTE ONLY): 32 min   Charges:   PT Evaluation $PT Eval Low Complexity: 1 Low          Arlyss Gandy, PT, DPT Acute Rehabilitation Pager: [phone]   Arlyss Gandy 08/03/2020, 5:16 PM

## 2020-08-03 NOTE — Significant Event (Signed)
Patient and husband at beside and daughter is at work which are all non-english speaking, patient.

## 2020-08-04 ENCOUNTER — Other Ambulatory Visit (HOSPITAL_COMMUNITY): Payer: Self-pay

## 2020-08-04 DIAGNOSIS — I10 Essential (primary) hypertension: Secondary | ICD-10-CM

## 2020-08-04 DIAGNOSIS — N949 Unspecified condition associated with female genital organs and menstrual cycle: Secondary | ICD-10-CM

## 2020-08-04 LAB — BASIC METABOLIC PANEL
Anion gap: 5 (ref 5–15)
BUN: 29 mg/dL — ABNORMAL HIGH (ref 8–23)
CO2: 26 mmol/L (ref 22–32)
Calcium: 7.4 mg/dL — ABNORMAL LOW (ref 8.9–10.3)
Chloride: 104 mmol/L (ref 98–111)
Creatinine, Ser: 1.23 mg/dL — ABNORMAL HIGH (ref 0.44–1.00)
GFR, Estimated: 43 mL/min — ABNORMAL LOW (ref >60)
Glucose, Bld: 97 mg/dL (ref 70–99)
Potassium: 3.7 mmol/L (ref 3.5–5.1)
Sodium: 135 mmol/L (ref 135–145)

## 2020-08-04 LAB — CBC
HCT: 27.8 % — ABNORMAL LOW (ref 36.0–46.0)
Hemoglobin: 10 g/dL — ABNORMAL LOW (ref 12.0–15.0)
MCH: 27.3 pg (ref 26.0–34.0)
MCHC: 36 g/dL (ref 30.0–36.0)
MCV: 76 fL — ABNORMAL LOW (ref 80.0–100.0)
Platelets: 154 10*3/uL (ref 150–400)
RBC: 3.66 MIL/uL — ABNORMAL LOW (ref 3.87–5.11)
RDW: 17.6 % — ABNORMAL HIGH (ref 11.5–15.5)
WBC: 6.6 10*3/uL (ref 4.0–10.5)
nRBC: 0 % (ref 0.0–0.2)

## 2020-08-04 LAB — URINALYSIS, ROUTINE W REFLEX MICROSCOPIC
Bilirubin Urine: NEGATIVE
Glucose, UA: NEGATIVE mg/dL
Hgb urine dipstick: NEGATIVE
Ketones, ur: NEGATIVE mg/dL
Nitrite: NEGATIVE
Protein, ur: NEGATIVE mg/dL
Specific Gravity, Urine: 1.013 (ref 1.005–1.030)
pH: 5 (ref 5.0–8.0)

## 2020-08-04 LAB — CA 125: Cancer Antigen (CA) 125: 88.2 U/mL — ABNORMAL HIGH (ref 0.0–38.1)

## 2020-08-04 MED ORDER — ENSURE ENLIVE PO LIQD
237.0000 mL | Freq: Two times a day (BID) | ORAL | Status: DC
  Administered 2020-08-05 – 2020-08-11 (×12): 237 mL via ORAL

## 2020-08-04 MED ORDER — ADULT MULTIVITAMIN W/MINERALS CH
1.0000 | ORAL_TABLET | Freq: Every day | ORAL | Status: DC
  Administered 2020-08-04 – 2020-08-11 (×8): 1 via ORAL
  Filled 2020-08-04 (×8): qty 1

## 2020-08-04 MED ORDER — FUROSEMIDE 10 MG/ML IJ SOLN
40.0000 mg | Freq: Two times a day (BID) | INTRAMUSCULAR | Status: DC
  Administered 2020-08-04 – 2020-08-10 (×12): 40 mg via INTRAVENOUS
  Filled 2020-08-04 (×12): qty 4

## 2020-08-04 NOTE — Evaluation (Signed)
Occupational Therapy Evaluation Patient Details Name: Erin Patterson MRN: [id number] DOB: 05-21-1932 Today's Date: 08/04/2020    History of Present Illness 87 y.o. female presents to [facility] ED on 08/03/2020 with reports of dyspnea, weakness, and fatigue. Pt recently discharged from [facility] ED 2 days prior with similar symptoms. PMH includes anasarca, CHF, CVA, HTN, MI.   Clinical Impression   Pt admitted with the above diagnoses. Pt is independent with basic ADLs at home; assist from family with grocery shopping and some meal prep. Pt appears to be at/close to baseline with ADLs. Discussed energy conservation strategies for managing ADLs. Spouse present. [employer] Interpreter [phone] [employer] interpreting throughout session. Tolerated OT session well. No further OT needs indicated. OT signing off.      Follow Up Recommendations  No OT follow up;Supervision - Intermittent    Equipment Recommendations  None recommended by OT    Recommendations for Other Services       Precautions / Restrictions Precautions Precautions: None Restrictions Weight Bearing Restrictions: No      Mobility Bed Mobility Overal bed mobility: Independent             General bed mobility comments: clinical judgement and PT report    Transfers Overall transfer level: Needs assistance Equipment used: None Transfers: Sit to/from Stand Sit to Stand: Supervision         General transfer comment: Cued to not use rolling table for single UE support while trying to don slip on shoes in standing    Balance Overall balance assessment: Needs assistance Sitting-balance support: No upper extremity supported;Feet supported Sitting balance-Leahy Scale: Good     Standing balance support: No upper extremity supported Standing balance-Leahy Scale: Fair                             ADL either performed or assessed with clinical judgement   ADL Overall ADL's : Modified independent                                        General ADL Comments: Up in room upon arrival of OT. Pt walked in the room and completed 1 grooming task standing at sink. Good toleration. Discussed energy conservation strategies for ADLs     Vision         Perception     Praxis      Pertinent Vitals/Pain Pain Assessment: No/denies pain     Hand Dominance     Extremity/Trunk Assessment Upper Extremity Assessment Upper Extremity Assessment: Overall WFL for tasks assessed   Lower Extremity Assessment Lower Extremity Assessment: Defer to PT evaluation   Cervical / Trunk Assessment Cervical / Trunk Assessment: Normal   Communication Communication Communication: Prefers language other than [place];Interpreter utilized;Other (comment) ([employer]: [id number] [employer])   Cognition Arousal/Alertness: Awake/alert Behavior During Therapy: WFL for tasks assessed/performed Overall Cognitive Status: Within Functional Limits for tasks assessed                                 General Comments: pleasant affect   General Comments  No SOB observed ot reported during OT session. Pt reports longer walking distances fatigued her.    Exercises     Shoulder Instructions      Home Living Family/patient expects to be discharged to:: Private residence Living  Arrangements: Spouse/significant other Available Help at Discharge: Family;Available 24 hours/day Type of Home: Apartment Home Access: Stairs to enter Entrance Stairs-Number of Steps: 10 Entrance Stairs-Rails: Right Home Layout: One level     Bathroom Shower/Tub: Producer, television/film/video: Standard Bathroom Accessibility: Yes   Home Equipment: Cane - single point;Shower seat          Prior Functioning/Environment Level of Independence: Independent        Comments: child(ren) help with getting groceries and meal prep per pt report. They work 8am to [employer] schedule.        OT Problem List:        OT Treatment/Interventions:       OT Goals(Current goals can be found in the care plan section) Acute Rehab OT Goals Patient Stated Goal: to improve activity tolerance  OT Frequency:     Barriers to D/C:            Co-evaluation              AM-PAC OT "6 Clicks" Daily Activity     Outcome Measure Help from another person eating meals?: None Help from another person taking care of personal grooming?: None Help from another person toileting, which includes using toliet, bedpan, or urinal?: None Help from another person bathing (including washing, rinsing, drying)?: None Help from another person to put on and taking off regular upper body clothing?: None Help from another person to put on and taking off regular lower body clothing?: None 6 Click Score: 24   End of Session    Activity Tolerance: Patient tolerated treatment well Patient left: with call bell/phone within reach;with family/visitor present;Other (comment) (sitting EOB. Spouse present)  OT Visit Diagnosis: Unsteadiness on feet (R26.81)                Time: 1027-1040 OT Time Calculation (min): 13 min Charges:  OT General Charges $OT Visit: 1 Visit OT Evaluation $OT Eval Low Complexity: 1 Low  Raynald Kemp, OT Acute Rehabilitation Services Pager: [phone] Office: [phone]   Pilar Grammes 08/04/2020, 10:53 AM

## 2020-08-04 NOTE — Progress Notes (Signed)
Physical Therapy Treatment Patient Details Name: Erin Patterson MRN: [id number] DOB: 01-12-1933 Today's Date: 08/04/2020    History of Present Illness 87 y.o. female presents to [facility] ED on 08/03/2020 with reports of dyspnea, weakness, and fatigue. Pt recently discharged from [facility] ED 2 days prior with similar symptoms. PMH includes anasarca, CHF, CVA, HTN, MI.    PT Comments    Pt demonstrates ability to perform transfers, gait, and stair negotiation without requiring physical assistance. Pt tolerates ambulation of increased distances compared to last session, with use of rollator. Pt requires multiple cues and reminders for managing device brakes prior to performing transfers, and is unsafe to mobilize with rollator at this time without supervision. Pt demonstrates deficits in activity tolerance and will benefit from continued acute PT to improve endurance and increase tolerance to mobility.    Follow Up Recommendations  No PT follow up     Equipment Recommendations  Rolling walker with 5" wheels    Recommendations for Other Services       Precautions / Restrictions Precautions Precautions: None Restrictions Weight Bearing Restrictions: No    Mobility  Bed Mobility Overal bed mobility: Independent             General bed mobility comments: Pt received sitting at EOB    Transfers Overall transfer level: Needs assistance Equipment used: None;4-wheeled walker Transfers: Sit to/from Stand Sit to Stand: Supervision         General transfer comment: Cues for managing rollator brakes.  Ambulation/Gait Ambulation/Gait assistance: Min Emergency planning/management officer (Feet): 350 Feet Assistive device: 4-wheeled walker;None Gait Pattern/deviations: Step-through pattern;Decreased stride length Gait velocity: reduced Gait velocity interpretation: 1.31 - 2.62 ft/sec, indicative of limited community ambulator General Gait Details: slow, steady gait with use of rollator. 2 seated  rest breaks.   Stairs Stairs: Yes Stairs assistance: Min guard Stair Management: Two rails;Step to pattern;Sideways Number of Stairs:  (flight) General stair comments: Pt requires seated rest break after stair ascent.   Wheelchair Mobility    Modified Rankin (Stroke Patients Only)       Balance Overall balance assessment: Needs assistance Sitting-balance support: No upper extremity supported;Feet supported Sitting balance-Leahy Scale: Good     Standing balance support: No upper extremity supported Standing balance-Leahy Scale: Fair Standing balance comment: Pt able to stand statically without reliance of UE support                            Cognition Arousal/Alertness: Awake/alert Behavior During Therapy: WFL for tasks assessed/performed Overall Cognitive Status: Difficult to assess                                 General Comments: pleasant affect      Exercises      General Comments General comments (skin integrity, edema, etc.): VSS on RA.      Pertinent Vitals/Pain Pain Assessment: No/denies pain    Home Living Family/patient expects to be discharged to:: Private residence Living Arrangements: Spouse/significant other Available Help at Discharge: Family;Available 24 hours/day Type of Home: Apartment Home Access: Stairs to enter Entrance Stairs-Rails: Right Home Layout: One level Home Equipment: Cane - single point;Shower seat      Prior Function Level of Independence: Independent      Comments: child(ren) help with getting groceries and meal prep per pt report. They work 8am to [employer] schedule.   PT Goals (current  goals can now be found in the care plan section) Acute Rehab PT Goals Patient Stated Goal: to improve activity tolerance Progress towards PT goals: Progressing toward goals    Frequency    Min 3X/week      PT Plan Current plan remains appropriate    Co-evaluation              AM-PAC PT "6  Clicks" Mobility   Outcome Measure  Help needed turning from your back to your side while in a flat bed without using bedrails?: None Help needed moving from lying on your back to sitting on the side of a flat bed without using bedrails?: None Help needed moving to and from a bed to a chair (including a wheelchair)?: A Little Help needed standing up from a chair using your arms (e.g., wheelchair or bedside chair)?: A Little Help needed to walk in hospital room?: A Little Help needed climbing 3-5 steps with a railing? : A Little 6 Click Score: 20    End of Session Equipment Utilized During Treatment: Gait belt Activity Tolerance: Patient tolerated treatment well Patient left: in bed;with call bell/phone within reach;with family/visitor present Nurse Communication: Mobility status PT Visit Diagnosis: Other abnormalities of gait and mobility (R26.89)     Time: 1150-1230 PT Time Calculation (min) (ACUTE ONLY): 40 min  Charges:  $Gait Training: 23-37 mins $Therapeutic Activity: 8-22 mins                     Acute Rehab  Pager: [phone]    Waldemar Dickens, SPT  08/04/2020, 1:18 PM

## 2020-08-04 NOTE — Progress Notes (Signed)
Heart Failure Stewardship Pharmacist Progress Note   PCP: Jackie Plum, MD PCP-Cardiologist: Jodelle Red, MD    HPI:  87 yo F with PMH of HTN, HLD, prior stroke, MI, and HFpEF. She presented to the ED for acute on chronic diastolic heart failure. Previously admitted from 4/27-5/3 and back in the ED on 5/30 for similar presentations. CXR showed vascular congestion and small pleural effusions. Last ECHO done on 4/28 with LVEF of 60-65% and normal RV. After she presented to the ED on 5/30, multiple attempts were made to contact patient to organize her coming to HF [facility] clinic. However, patient was unable to be reached.  Current HF Medications: Furosemide 40 mg IV BID Carvedilol 3.125 mg BID  Prior to admission HF Medications: Furosemide 20 mg BID Carvedilol 3.125 mg BID Lisinopril 20 mg daily *Not taking Jardiance 10 mg daily (somehow was never dispensed by [facility] pharmacy)  Pertinent Lab Values: Serum creatinine 1.23, BUN 29, Potassium 3.7, Sodium 135, BNP 315  Vital Signs: Weight: 144 lbs (admission weight: 146 lbs) Blood pressure: 110-150/60s  Heart rate: 60-70s   Medication Assistance / Insurance Benefits Check: Does the patient have prescription insurance?  Yes Type of insurance plan: [place] Medicaid  Outpatient Pharmacy:  Prior to admission outpatient pharmacy: [facility] Is the patient willing to use [facility] TOC pharmacy at discharge? Yes Is the patient willing to transition their outpatient pharmacy to utilize a [facility] outpatient pharmacy?   Pending    Assessment: 1. Acute on chronic diastolic CHF (EF 38-93%). NYHA class III symptoms. - Continue furosemide 40 mg IV BID - Continue carvedilol 3.125 mg BID - Consider optimizing to Entresto 24/26 mg BID for HFpEF and reduced risk for HF hospitalization  - Consider restarting Jardiance 10 mg daily prior to discharge - message sent to [facility] pharmacy to investigate why this was never filled during last discharge.     Plan: 1) Medication changes recommended at this time: - Continue current regimen - Start Entresto instead of lisinopril prior to discharge - Restart Jardiance 10 mg daily prior to discharge  2) Patient assistance: - Has Medicaid - prescription copays should be $0-3 per month - HF TOC appt scheduled for 6/21   3)  Education  - To be completed prior to discharge  Sharen Hones, PharmD, BCPS Heart Failure Stewardship Pharmacist Phone [phone]

## 2020-08-04 NOTE — Progress Notes (Signed)
Progress Note  Patient Name: Erin Patterson Date of Encounter: 08/04/2020  [facility] Cardiologist: Jodelle Red, MD   Subjective   Professional interrupter used for encounter #[id number] for [place] interpretation.  Patient states she continues to feel fatigued, does not think her leg swelling are improving, is wearing compression stocking, states she has urinated 3 times yesterday which is about her average.  She denies any chest pain or shortness of breath, dizziness or orthopnea.  She states she is compliant with her medications at home.  She drinks about 3 to 4 cups of water daily.  She is asking when can she go home.   Inpatient Medications    Scheduled Meds:  aspirin EC  81 mg Oral Daily   atorvastatin  40 mg Oral q1800   carvedilol  3.125 mg Oral BID WC   docusate sodium  100 mg Oral BID   enoxaparin (LOVENOX) injection  30 mg Subcutaneous Q24H   furosemide  20 mg Intravenous BID   sodium chloride flush  3 mL Intravenous Q12H   Continuous Infusions:  PRN Meds: acetaminophen **OR** acetaminophen, bisacodyl, hydrALAZINE, morphine injection, ondansetron **OR** ondansetron (ZOFRAN) IV, oxyCODONE, polyethylene glycol, traZODone   Vital Signs    Vitals:   08/03/20 2023 08/04/20 0046 08/04/20 0334 08/04/20 0724  BP: 116/71 107/62 (!) 89/54 105/65  Pulse: 62 65 68 63  Resp: 18 18 18 18   Temp: 97.8 F (36.6 C) 98.1 F (36.7 C) 98.2 F (36.8 C) 98 F (36.7 C)  TempSrc: Oral Oral Oral Oral  SpO2: 100% 97% 98% 99%  Weight:   65.5 kg     Intake/Output Summary (Last 24 hours) at 08/04/2020 0947 Last data filed at 08/04/2020 0842 Gross per 24 hour  Intake 240 ml  Output --  Net 240 ml   Last 3 Weights 08/04/2020 08/03/2020 06/27/2020  Weight (lbs) 144 lb 6.4 oz 146 lb 13.2 oz 132 lb 4.8 oz  Weight (kg) 65.5 kg 66.6 kg 60.011 kg      Telemetry    Sinus rhythm 70s, artifacts - Personally Reviewed  ECG    No new tracing today- Personally Reviewed  Physical  Exam   GEN: No acute distress.   Neck: No JVD Cardiac: RRR, no murmurs, rubs, or gallops.  Respiratory: Clear to auscultation bilaterally. On room air.  GI: Soft, nontender, non-distended  MS: Anasarca of BLE up to thighs bilaterally Neuro:  Nonfocal  Psych: Normal affect   Labs    High Sensitivity Troponin:   Recent Labs  Lab 08/03/20 1023 08/03/20 1216  TROPONINIHS 20* 15      Chemistry Recent Labs  Lab 08/03/20 1023 08/04/20 0315  NA 135 135  K 4.0 3.7  CL 102 104  CO2 26 26  GLUCOSE 113* 97  BUN 32* 29*  CREATININE 1.51* 1.23*  CALCIUM 7.4* 7.4*  PROT 6.3*  --   ALBUMIN 2.0*  --   AST 66*  --   ALT 36  --   ALKPHOS 64  --   BILITOT 0.7  --   GFRNONAA 33* 43*  ANIONGAP 7 5     Hematology Recent Labs  Lab 08/03/20 1023 08/04/20 0315  WBC 7.3 6.6  RBC 3.95 3.66*  HGB 10.6* 10.0*  HCT 30.1* 27.8*  MCV 76.2* 76.0*  MCH 26.8 27.3  MCHC 35.2 36.0  RDW 17.7* 17.6*  PLT 163 154    BNP Recent Labs  Lab 08/03/20 1023  BNP 315.0*  DDimer No results for input(s): DDIMER in the last 168 hours.   Radiology    DG Chest Port 1 View  Result Date: 08/03/2020 CLINICAL DATA:  Weakness and dyspnea EXAM: PORTABLE CHEST 1 VIEW COMPARISON:  07/24/2020 FINDINGS: Cardiomegaly. Congested appearance of vessels with small pleural effusions greater on the left. Bulky mitral annular calcification. Negative for pneumothorax. Extensive artifact from EKG leads IMPRESSION: Vascular congestion and small pleural effusions. No significant change from 07/24/2020. Electronically Signed   By: Marnee Spring M.D.   On: 08/03/2020 10:58   US PELVIC COMPLETE WITH TRANSVAGINAL  Result Date: 08/04/2020 CLINICAL DATA:  Initial evaluation for anasarca. EXAM: TRANSABDOMINAL AND TRANSVAGINAL ULTRASOUND OF PELVIS TECHNIQUE: Both transabdominal and transvaginal ultrasound examinations of the pelvis were performed. Transabdominal technique was performed for global imaging of the pelvis  including uterus, ovaries, adnexal regions, and pelvic cul-de-sac. It was necessary to proceed with endovaginal exam following the transabdominal exam to visualize the uterus, endometrium, and ovaries. COMPARISON:  Prior CT from 06/26/2020. FINDINGS: Uterus Measurements: 6.0 x 2.9 x 4.3 cm = volume: 38.2 mL. Uterus is anteverted. No discrete fibroid or other mass. Extensive vascular calcifications seen within the parametrial vasculature. Endometrium Small volume simple anechoic fluid present within the endometrial cavity. Endometrial stripe itself not well visualized. No visible endometrial thickening or other focal abnormality. Right ovary Not visualized.  No adnexal mass. Left ovary Native left ovary not seen. There is a simple cyst within the left adnexa measuring 2.4 x 1.6 x 2.5 cm. No internal complexity, vascularity, or solid nodularity. Other findings No abnormal free fluid. IMPRESSION: 1. 2.5 cm simple left adnexal cyst, almost certainly benign given size and appearance. No followup imaging recommended. Note: This recommendation does not apply to premenarchal patients or to those with increased risk (genetic, family history, elevated tumor markers or other high-risk factors) of ovarian cancer. Reference: Radiology 2019 Nov; 293(2):359-371. 2. Nonvisualization of either ovary. No free fluid within the pelvis. 3. Trace simple fluid within the endometrial cavity. Electronically Signed   By: Rise Mu M.D.   On: 08/04/2020 03:52    Cardiac Studies   Echo 06/22/20 1. Left ventricular ejection fraction, by estimation, is 60 to 65%. The  left ventricle has normal function. The left ventricle has no regional  wall motion abnormalities. There is moderate concentric left ventricular  hypertrophy. Left ventricular diastolic function could not be evaluated.   2. Right ventricular systolic function is normal. The right ventricular  size is normal.   3. The mitral valve is degenerative. Mild mitral  valve regurgitation. No  evidence of mitral stenosis. Moderate to severe mitral annular  calcification.   4. The aortic valve is tricuspid. There is mild calcification of the  aortic valve. Aortic valve regurgitation is mild. No aortic stenosis is  present.  Patient Profile     87 y.o. female with PMH of chronic diastolic heart failure, hypertension, hyperlipidemia, history of ischemic CVA 07/2017,  and chronic anemia, cardiology is following for CHF.   Assessment & Plan    Acute on chronic diastolic heart failure - presented with BLE edema worsening for 5 days and fatigue  - BNP 315  (was 629 on 07/24/20 ) - CXR Vascular congestion and small pleural effusions. No significant change from 07/24/2020 - weight 146 >144 (weight was 139 ib on 06/23/20),  I&O not accurately recorded since admission - Echo from 06/22/20 showed EF 60 -65%, no RWMA, moderate concentric LVH, RV nomral, mild MR, moderate to severe mitral annular  calcification. mild calcification of the aortic valve, mild AR.  - GDMT: on Coreg 3.125mg  BID,  lisinopril 20mg  daily, and lasix 20mg  at home - Hold Lisinopril due to AKI, continue Coreg, on IV lasix 20mg  BID since admission, patient reports no change of leg swelling or urination frequency, will increase to IV Lasix 40mg  BID today, monitor response   Anasarca  Chronic hypoalbuminemia   Elevated CA 125  Anemia  - clinical presentation appears out of proportion on chronic diastolic HF - Dry CT  06/26/20 demonstrated normal liver appearance, anasarca without ascites, left adnexal soft tissue fullness, no occult malignancy  - pelvic U/S showed 2.5 cm simple left adnexal cyst - consider further workup on cirrhosis, nephrotic syndrome, and malignancy as cause of anasarca and hypoalbuminemia - managed per primary team   AKI - Cr 1.51 POA, was 0.85 on 07/24/20 - Improving with IV Lasix 20mg  BID to 1.23 today, non-oliguria  - etiology unclear, consider further renal workup of  underlying etiology, as CRS is a diagnosis of rule out  - HOLD lisinopril   HTN - BP low normal, parameter added to Lasix , on low dose Coreg   HLD - on statin   HX of CVA - continue ASA and statin   Ascending thoracic aortic dilation  - no abdominal pain  - Ct showed 4.1 cm ascending thoracic aortic caliber - need outpatient surveillance        For questions or updates, please contact [facility] Please consult [email or web address] for contact info under        Signed, , NP  08/04/2020, 9:47 AM

## 2020-08-04 NOTE — Progress Notes (Signed)
[facility]  PROGRESS NOTE  Erin Patterson VZD:[id number] DOB: 1932-08-10 DOA: 08/03/2020 PCP: Jackie Plum, MD   Brief HPI:   87 year old female with medical history of chronic diastolic CHF, hypertension, hyperlipidemia, dementia, CAD presents with fatigue and worsening lower extremity edema.  Patient has hypoalbuminemia for past 2 months and now has worsening swelling including her upper extremities and lower extremities.   In the ED patient was found to have lower extremity edema/anasarca, thought to be from congestive heart failure so she was started on IV Lasix.  Cardiology was consulted.    Subjective   Patient seen and examined, denies shortness of breath, complains of fatigue and not much significant improvement in legs edema.  Communicated with the help of tele interpreter   Assessment/Plan:     Anasarca/lower extremity edema -Unclear etiology -Serum albumin is 2.0; UA obtained on 06/25/2020 was unremarkable.  We will repeat UA today -CT abdomen pelvis chest obtained on 06/26/2020 was unremarkable -Echocardiogram on 06/22/2020 showed EF of 60 to 65%; normal right ventricular function and size.  Concern for right heart failure, diuresed very well with IV Lasix.  Cardiology following, likely will need right heart cath. -CA125 minimally elevated at 88; pelvic ultrasound obtained yesterday shows only simple cyst, ovaries not visualized bilaterally -Denies poor p.o. intake, anorexia, weight loss, vomiting or diarrhea   Acute kidney injury  -Patient baseline creatinine was around 1 in May 2022 -Presented with creatinine of 1.51; improved to 1.23 with diuresis. -Follow diuresis with IV Lasix 40 mg twice daily; will follow serum creatinine in a.m.   Acute on chronic diastolic CHF -Patient's BNP was 629 on 07/24/2020; 315 on 08/03/2020 -Started on IV Lasix as above -Cardiology following   Hypertension -Continue Coreg, lisinopril on hold -Blood pressure is  stable   History of CVA -Continue aspirin, atorvastatin   Ascending thoracic aortic enlargement -CT abdomen/pelvis showed 4.1 cm ascending thoracic aortic caliber -Recommended annual follow-up by CT or MRI   Social issues -As per daughter, both.  Showing signs of cognitive impairment and unable to take care of themselves.  TOC consulted   Scheduled medications:    aspirin EC  81 mg Oral Daily   atorvastatin  40 mg Oral q1800   carvedilol  3.125 mg Oral BID WC   docusate sodium  100 mg Oral BID   enoxaparin (LOVENOX) injection  30 mg Subcutaneous Q24H   furosemide  40 mg Intravenous BID   sodium chloride flush  3 mL Intravenous Q12H         Data Reviewed:   CBG:  No results for input(s): GLUCAP in the last 168 hours.  SpO2: 99 %    Vitals:   08/03/20 2023 08/04/20 0046 08/04/20 0334 08/04/20 0724  BP: 116/71 107/62 (!) 89/54 105/65  Pulse: 62 65 68 63  Resp: 18 18 18 18   Temp: 97.8 F (36.6 C) 98.1 F (36.7 C) 98.2 F (36.8 C) 98 F (36.7 C)  TempSrc: Oral Oral Oral Oral  SpO2: 100% 97% 98% 99%  Weight:   65.5 kg      Intake/Output Summary (Last 24 hours) at 08/04/2020 1126 Last data filed at 08/04/2020 0842 Gross per 24 hour  Intake 240 ml  Output --  Net 240 ml    No intake/output data recorded.  Filed Weights   08/03/20 1345 08/04/20 0334  Weight: 66.6 kg 65.5 kg    CBC:  Recent Labs  Lab 08/03/20 1023 08/04/20 0315  WBC 7.3 6.6  HGB  10.6* 10.0*  HCT 30.1* 27.8*  PLT 163 154  MCV 76.2* 76.0*  MCH 26.8 27.3  MCHC 35.2 36.0  RDW 17.7* 17.6*    Complete metabolic panel:  Recent Labs  Lab 08/03/20 1023 08/04/20 0315  NA 135 135  K 4.0 3.7  CL 102 104  CO2 26 26  GLUCOSE 113* 97  BUN 32* 29*  CREATININE 1.51* 1.23*  CALCIUM 7.4* 7.4*  AST 66*  --   ALT 36  --   ALKPHOS 64  --   BILITOT 0.7  --   ALBUMIN 2.0*  --   BNP 315.0*  --     No results for input(s): LIPASE, AMYLASE in the last 168 hours.  Recent Labs   Lab 08/03/20 1023  BNP 315.0*  SARSCOV2NAA NEGATIVE    ------------------------------------------------------------------------------------------------------------------ No results for input(s): CHOL, HDL, LDLCALC, TRIG, CHOLHDL, LDLDIRECT in the last 72 hours.  Lab Results  Component Value Date   HGBA1C 5.4 07/28/2017   ------------------------------------------------------------------------------------------------------------------ No results for input(s): TSH, T4TOTAL, T3FREE, THYROIDAB in the last 72 hours.  Invalid input(s): FREET3 ------------------------------------------------------------------------------------------------------------------ No results for input(s): VITAMINB12, FOLATE, FERRITIN, TIBC, IRON, RETICCTPCT in the last 72 hours.  Coagulation profile No results for input(s): INR, PROTIME in the last 168 hours. No results for input(s): DDIMER in the last 72 hours.  Cardiac Enzymes No results for input(s): CKTOTAL, CKMB, CKMBINDEX, TROPONINI in the last 168 hours.  ------------------------------------------------------------------------------------------------------------------    Component Value Date/Time   BNP 315.0 (H) 08/03/2020 1023     Antibiotics: Anti-infectives (From admission, onward)    None        Radiology Reports  DG Chest Port 1 View  Result Date: 08/03/2020 CLINICAL DATA:  Weakness and dyspnea EXAM: PORTABLE CHEST 1 VIEW COMPARISON:  07/24/2020 FINDINGS: Cardiomegaly. Congested appearance of vessels with small pleural effusions greater on the left. Bulky mitral annular calcification. Negative for pneumothorax. Extensive artifact from EKG leads IMPRESSION: Vascular congestion and small pleural effusions. No significant change from 07/24/2020. Electronically Signed   By: Marnee Spring M.D.   On: 08/03/2020 10:58   US PELVIC COMPLETE WITH TRANSVAGINAL  Result Date: 08/04/2020 CLINICAL DATA:  Initial evaluation for anasarca. EXAM:  TRANSABDOMINAL AND TRANSVAGINAL ULTRASOUND OF PELVIS TECHNIQUE: Both transabdominal and transvaginal ultrasound examinations of the pelvis were performed. Transabdominal technique was performed for global imaging of the pelvis including uterus, ovaries, adnexal regions, and pelvic cul-de-sac. It was necessary to proceed with endovaginal exam following the transabdominal exam to visualize the uterus, endometrium, and ovaries. COMPARISON:  Prior CT from 06/26/2020. FINDINGS: Uterus Measurements: 6.0 x 2.9 x 4.3 cm = volume: 38.2 mL. Uterus is anteverted. No discrete fibroid or other mass. Extensive vascular calcifications seen within the parametrial vasculature. Endometrium Small volume simple anechoic fluid present within the endometrial cavity. Endometrial stripe itself not well visualized. No visible endometrial thickening or other focal abnormality. Right ovary Not visualized.  No adnexal mass. Left ovary Native left ovary not seen. There is a simple cyst within the left adnexa measuring 2.4 x 1.6 x 2.5 cm. No internal complexity, vascularity, or solid nodularity. Other findings No abnormal free fluid. IMPRESSION: 1. 2.5 cm simple left adnexal cyst, almost certainly benign given size and appearance. No followup imaging recommended. Note: This recommendation does not apply to premenarchal patients or to those with increased risk (genetic, family history, elevated tumor markers or other high-risk factors) of ovarian cancer. Reference: Radiology 2019 Nov; 293(2):359-371. 2. Nonvisualization of either ovary. No free fluid within the pelvis.  3. Trace simple fluid within the endometrial cavity. Electronically Signed   By: Rise Mu M.D.   On: 08/04/2020 03:52      DVT prophylaxis: Lovenox  Code Status: Full code  Family Communication: Discussed with patient's husband at bedside   Consultants: Cardiology  Procedures:     Objective    Physical Examination:   General: Appears in no acute  distress Cardiovascular: S1-S2, regular, no murmur auscultated Respiratory: Clear to auscultation bilaterally Abdomen: Abdomen is soft, nontender, no organomegaly Extremities: 3+ edema bilaterally up to thighs Neurologic: Cranial nerves II through XII grossly intact, patient is alert oriented x3.  Intact insight and judgment   Status is: Inpatient  Dispo: The patient is from: Home              Anticipated d/c is to: Home              Anticipated d/c date is: 08/06/2020              Patient currently not stable for discharge  Barrier to discharge-ongoing evaluation for lower extremity edema/anasarca  COVID-19 Labs  No results for input(s): DDIMER, FERRITIN, LDH, CRP in the last 72 hours.  Lab Results  Component Value Date   SARSCOV2NAA NEGATIVE 08/03/2020   SARSCOV2NAA NEGATIVE 06/21/2020    Microbiology  Recent Results (from the past 240 hour(s))  Resp Panel by RT-PCR (Flu A&B, Covid) Nasopharyngeal Swab     Status: None   Collection Time: 08/03/20 10:23 AM   Specimen: Nasopharyngeal Swab; Nasopharyngeal(NP) swabs in vial transport medium  Result Value Ref Range Status   SARS Coronavirus 2 by RT PCR NEGATIVE NEGATIVE Final    Comment: (NOTE) SARS-CoV-2 target nucleic acids are NOT DETECTED.  The SARS-CoV-2 RNA is generally detectable in upper respiratory specimens during the acute phase of infection. The lowest concentration of SARS-CoV-2 viral copies this assay can detect is 138 copies/mL. A negative result does not preclude SARS-Cov-2 infection and should not be used as the sole basis for treatment or other patient management decisions. A negative result may occur with  improper specimen collection/handling, submission of specimen other than nasopharyngeal swab, presence of viral mutation(s) within the areas targeted by this assay, and inadequate number of viral copies(<138 copies/mL). A negative result must be combined with clinical observations, patient history, and  epidemiological information. The expected result is Negative.  Fact Sheet for Patients:  BloggerCourse.com  Fact Sheet for Healthcare Providers:  SeriousBroker.it  This test is no t yet approved or cleared by the [place] FDA and  has been authorized for detection and/or diagnosis of SARS-CoV-2 by FDA under an Emergency Use Authorization (EUA). This EUA will remain  in effect (meaning this test can be used) for the duration of the COVID-19 declaration under Section 564(b)(1) of the Act, 21 U.S.C.section 360bbb-3(b)(1), unless the authorization is terminated  or revoked sooner.       Influenza A by PCR NEGATIVE NEGATIVE Final   Influenza B by PCR NEGATIVE NEGATIVE Final    Comment: (NOTE) The Xpert Xpress SARS-CoV-2/FLU/RSV plus assay is intended as an aid in the diagnosis of influenza from Nasopharyngeal swab specimens and should not be used as a sole basis for treatment. Nasal washings and aspirates are unacceptable for Xpert Xpress SARS-CoV-2/FLU/RSV testing.  Fact Sheet for Patients: BloggerCourse.com  Fact Sheet for Healthcare Providers: SeriousBroker.it  This test is not yet approved or cleared by the [place] FDA and has been authorized for detection and/or diagnosis of SARS-CoV-2  by FDA under an Emergency Use Authorization (EUA). This EUA will remain in effect (meaning this test can be used) for the duration of the COVID-19 declaration under Section 564(b)(1) of the Act, 21 U.S.C. section 360bbb-3(b)(1), unless the authorization is terminated or revoked.  Performed at [facility], [address] [place], [place] [address]              Meredeth Ide   [facility] If 7PM-7AM, please contact night-coverage at [email or web address], Office  [phone]   08/04/2020, 11:26 AM  LOS: 1 day

## 2020-08-04 NOTE — Progress Notes (Signed)
Initial Nutrition Assessment  DOCUMENTATION CODES:   Not applicable  INTERVENTION:   -Ensure Enlive po BID, each supplement provides 350 kcal and 20 grams of protein  -MVI with minerals daily  NUTRITION DIAGNOSIS:   Increased nutrient needs related to chronic illness (CHF) as evidenced by estimated needs.  GOAL:   Patient will meet greater than or equal to 90% of their needs  MONITOR:   PO intake, Supplement acceptance, Labs, Weight trends, Skin, I & O's  REASON FOR ASSESSMENT:   Consult Assessment of nutrition requirement/status  ASSESSMENT:   Erin Patterson is a 87 y.o. female with medical history significant of  chronic diastolic CHF; HTN; HLD; dementia; and CAD presenting with fatigue and LE edema.  She was previously admitted from 4/27-5/3 for the same.  She returned to the ER on 5/30 with similar symptoms and her Lasix was increased with plan for the home heart failure program.  She is noticing severe LE edema.  Her "whole body is swollen including my feet, my arms, my face."  She is very fatigued.  She does not think she breathes correctly and this makes her feel tired all the time.  No cough.  She takes medication daily for HTN and HLD.  She has been taking 2 pills a day of Lasix for maybe 3 days.  Pt admitted with lower extremity edema/ anasarca, diastolic CHF.   Reviewed I/O's: +240 ml x 24 hours  Case discussed with RN prior to visit, who reports concern regarding pt's nutritional status. Per RN, pt husband has been eating food off of meal trays. Nurse tech reports that pt consumed 75% of breakfast tray today. RN shares that pt is frail and husband is disabled; concern for food insecurity. Per RN, pt husband has been given meal tickets and floor stock items in the past by department- discussed options to provide options for food (meal tickets, guest tray provided by department). Communicated to Systems developer.   Spoke with pt and husband  with assistance of [employer] [phone]). Pt reports very good appetite and consumed most of her breakfast this morning. She usually consumes 2 meals per day PTA, which she prepares (soup OR stir fry with fish and cultural vegetables). She denies any issues chewing or swallowing. She confirms that she and her husband live with their daughter, who does grocery shopping.   Pt does not think she has lost weight. She denies any changes in how her clothing has fit. She is unsure of UBW. Per pt, she only takes antihypertensive medication PTA. RD suspects poor insight into CHF and management- doubtful that pt monitors weight at home.   RD discussed rationale for hospitalization. Encouraged pt to keep compression socks on (pt was trying to remove them during exam).   Discussed importance of good meal and supplement intake to promote healing. Pt's neighbor stopped by during RD assessment, who provided outside food to pt and family.   Discussed additional findings with RN.   Albumin has a half-life of 21 days and is strongly affected by stress response and inflammatory process, therefore, do not expect to see an improvement in this lab value during acute hospitalization. When a patient presents with low albumin, it is likely skewed due to the acute inflammatory response.  Unless it is suspected that patient had poor PO intake or malnutrition prior to admission, then RD should not be consulted solely for low albumin. Note that low albumin is no longer used to  diagnose malnutrition; [place] uses the new malnutrition guidelines published by the American Society for Parenteral and Enteral Nutrition (A.S.P.E.N.) and the Academy of Nutrition and Dietetics (AND).     Medications reviewed and include colace and lasix.  Lab Results  Component Value Date   HGBA1C 5.4 07/28/2017   PTA DM medications are 10 mg empagliflozin daily.   Labs reviewed.   NUTRITION - FOCUSED PHYSICAL EXAM:  Flowsheet  Row Most Recent Value  Orbital Region No depletion  Upper Arm Region No depletion  Thoracic and Lumbar Region No depletion  Buccal Region No depletion  Temple Region Mild depletion  Clavicle Bone Region No depletion  Clavicle and Acromion Bone Region No depletion  Scapular Bone Region No depletion  Dorsal Hand No depletion  Patellar Region No depletion  Anterior Thigh Region No depletion  Posterior Calf Region No depletion  Edema (RD Assessment) Mild  Hair Reviewed  Eyes Reviewed  Mouth Reviewed  Skin Reviewed  Nails Reviewed       Diet Order:   Diet Order             Diet Heart Room service appropriate? Yes; Fluid consistency: Thin; Fluid restriction: 1500 mL Fluid  Diet effective now                   EDUCATION NEEDS:   Education needs have been addressed  Skin:  Skin Assessment: Reviewed RN Assessment  Last BM:  08/03/20  Height:   Ht Readings from Last 1 Encounters:  09/14/19 5\' 1"  (1.549 m)    Weight:   Wt Readings from Last 1 Encounters:  08/04/20 65.5 kg    Ideal Body Weight:  47.7 kg  BMI:  Body mass index is 27.28 kg/m.  Estimated Nutritional Needs:   Kcal:  1650-1850  Protein:  80-95 grams  Fluid:  > 1.6 L    10/04/20, RD, LDN, CDCES Registered Dietitian II Certified Diabetes Care and Education Specialist Please refer to [facility] for RD and/or RD on-call/weekend/after hours pager

## 2020-08-04 NOTE — Progress Notes (Signed)
Progress Note  Patient Name: Erin Patterson Date of Encounter: 08/05/2020  [facility] Cardiologist: Jodelle Red, MD   Subjective   Seen by palliative care this AM and patient made DNR/DNI. Declined using interpreter this morning. States she feels okay. Breathing is okay.  Inpatient Medications    Scheduled Meds:  aspirin EC  81 mg Oral Daily   atorvastatin  40 mg Oral q1800   carvedilol  3.125 mg Oral BID WC   docusate sodium  100 mg Oral BID   enoxaparin (LOVENOX) injection  30 mg Subcutaneous Q24H   feeding supplement  237 mL Oral BID BM   furosemide  40 mg Intravenous BID   multivitamin with minerals  1 tablet Oral Daily   polyethylene glycol  17 g Oral Daily   sodium chloride flush  3 mL Intravenous Q12H   Continuous Infusions:  PRN Meds: acetaminophen **OR** acetaminophen, bisacodyl, hydrALAZINE, morphine injection, ondansetron **OR** ondansetron (ZOFRAN) IV, oxyCODONE, traZODone   Vital Signs    Vitals:   08/04/20 1622 08/04/20 1712 08/04/20 1922 08/05/20 0556  BP: (!) 147/66 117/64 132/65 122/60  Pulse: 64 67 66 79  Resp:  18 18 18   Temp: 97.8 F (36.6 C) 98.1 F (36.7 C) 98.2 F (36.8 C) 98 F (36.7 C)  TempSrc: Oral Oral Oral Oral  SpO2: 100% 91% 99% 98%  Weight:    65.9 kg    Intake/Output Summary (Last 24 hours) at 08/05/2020 0950 Last data filed at 08/04/2020 1924 Gross per 24 hour  Intake --  Output 800 ml  Net -800 ml   Last 3 Weights 08/05/2020 08/04/2020 08/03/2020  Weight (lbs) 145 lb 3.2 oz 144 lb 6.4 oz 146 lb 13.2 oz  Weight (kg) 65.862 kg 65.5 kg 66.6 kg      Telemetry    NSR - Personally Reviewed  ECG    No new tracing - Personally Reviewed  Physical Exam   GEN: Elderly female, comfortable Neck: No JVD Cardiac: RRR, no murmurs, rubs, or gallops.  Respiratory: Clear to auscultation bilaterally. GI: Soft, nontender, non-distended  MS: Anasaric with pitting edema throughout. Extremities warm.  Neuro:  Nonfocal   Psych: Normal affect   Labs    High Sensitivity Troponin:   Recent Labs  Lab 08/03/20 1023 08/03/20 1216  TROPONINIHS 20* 15      Chemistry Recent Labs  Lab 08/03/20 1023 08/04/20 0315  NA 135 135  K 4.0 3.7  CL 102 104  CO2 26 26  GLUCOSE 113* 97  BUN 32* 29*  CREATININE 1.51* 1.23*  CALCIUM 7.4* 7.4*  PROT 6.3*  --   ALBUMIN 2.0*  --   AST 66*  --   ALT 36  --   ALKPHOS 64  --   BILITOT 0.7  --   GFRNONAA 33* 43*  ANIONGAP 7 5     Hematology Recent Labs  Lab 08/03/20 1023 08/04/20 0315  WBC 7.3 6.6  RBC 3.95 3.66*  HGB 10.6* 10.0*  HCT 30.1* 27.8*  MCV 76.2* 76.0*  MCH 26.8 27.3  MCHC 35.2 36.0  RDW 17.7* 17.6*  PLT 163 154    BNP Recent Labs  Lab 08/03/20 1023  BNP 315.0*     DDimer No results for input(s): DDIMER in the last 168 hours.   Radiology    DG Chest Port 1 View  Result Date: 08/03/2020 CLINICAL DATA:  Weakness and dyspnea EXAM: PORTABLE CHEST 1 VIEW COMPARISON:  07/24/2020 FINDINGS: Cardiomegaly. Congested appearance of  vessels with small pleural effusions greater on the left. Bulky mitral annular calcification. Negative for pneumothorax. Extensive artifact from EKG leads IMPRESSION: Vascular congestion and small pleural effusions. No significant change from 07/24/2020. Electronically Signed   By: Marnee Spring M.D.   On: 08/03/2020 10:58   US PELVIC COMPLETE WITH TRANSVAGINAL  Result Date: 08/04/2020 CLINICAL DATA:  Initial evaluation for anasarca. EXAM: TRANSABDOMINAL AND TRANSVAGINAL ULTRASOUND OF PELVIS TECHNIQUE: Both transabdominal and transvaginal ultrasound examinations of the pelvis were performed. Transabdominal technique was performed for global imaging of the pelvis including uterus, ovaries, adnexal regions, and pelvic cul-de-sac. It was necessary to proceed with endovaginal exam following the transabdominal exam to visualize the uterus, endometrium, and ovaries. COMPARISON:  Prior CT from 06/26/2020. FINDINGS: Uterus  Measurements: 6.0 x 2.9 x 4.3 cm = volume: 38.2 mL. Uterus is anteverted. No discrete fibroid or other mass. Extensive vascular calcifications seen within the parametrial vasculature. Endometrium Small volume simple anechoic fluid present within the endometrial cavity. Endometrial stripe itself not well visualized. No visible endometrial thickening or other focal abnormality. Right ovary Not visualized.  No adnexal mass. Left ovary Native left ovary not seen. There is a simple cyst within the left adnexa measuring 2.4 x 1.6 x 2.5 cm. No internal complexity, vascularity, or solid nodularity. Other findings No abnormal free fluid. IMPRESSION: 1. 2.5 cm simple left adnexal cyst, almost certainly benign given size and appearance. No followup imaging recommended. Note: This recommendation does not apply to premenarchal patients or to those with increased risk (genetic, family history, elevated tumor markers or other high-risk factors) of ovarian cancer. Reference: Radiology 2019 Nov; 293(2):359-371. 2. Nonvisualization of either ovary. No free fluid within the pelvis. 3. Trace simple fluid within the endometrial cavity. Electronically Signed   By: Rise Mu M.D.   On: 08/04/2020 03:52    Cardiac Studies   Echo 06/22/20 1. Left ventricular ejection fraction, by estimation, is 60 to 65%. The  left ventricle has normal function. The left ventricle has no regional  wall motion abnormalities. There is moderate concentric left ventricular  hypertrophy. Left ventricular diastolic function could not be evaluated.   2. Right ventricular systolic function is normal. The right ventricular  size is normal.   3. The mitral valve is degenerative. Mild mitral valve regurgitation. No  evidence of mitral stenosis. Moderate to severe mitral annular  calcification.   4. The aortic valve is tricuspid. There is mild calcification of the  aortic valve. Aortic valve regurgitation is mild. No aortic stenosis is   present.  Patient Profile     87 y.o. female with PMH of chronic diastolic heart failure, hypertension, hyperlipidemia, history of ischemic CVA 07/2017, and chronic anemia who presented with worsening fatigue and edema found to have acute on chronic diastolic HF exacerbation for which Cardiology has been consulted.   Assessment & Plan    #Acute on chronic diastolic heart failure Patient presented with BLE edema worsening for 5 days and fatigue found to have BNP 315  (was 629 on 07/24/20) and vascular congestion and small pleural effusions on CXR. No significant change from 07/24/2020. TTE 06/22/20 with LVEF 60 -65%, no RWMA, moderate concentric LVH, RV nomral, mild MR, moderate to severe mitral annular calcification. mild calcification of the aortic valve, mild AR. -Continue Coreg 3.125mg  BID -Continue lasix 40mg  IV daily -Holding ACE/ARB due to AKI on CKD -Monitor I/Os and daily weights -Agree with palliative care consultation   #Anasarca  #Chronic hypoalbuminemia   #Elevated CA 125 #Anemia  Clinical presentation appears out of proportion on chronic diastolic HF. CT 06/26/20 demonstrated normal liver appearance, anasarca without ascites, left adnexal soft tissue fullness, no occult malignancy. Unclear etiology. -Management per primary -Agree with palliative care assessment   #AKI Improving with Cr 1.5-->1.23 -Continue lasix 40mg  IV BID as above -Holding lisinopril as above -Monitor I/Os   #HTN Controlled. -Continue coreg 3.125mg  BID as above   #HLD - Continue lipitor 40mg  daily  #HX of CVA -Continue ASA 81mg  daily and lipitor 40mg  daily   #Ascending thoracic aortic dilation - no abdominal pain - Ct showed 4.1 cm ascending thoracic aortic caliber - need outpatient surveillance        For questions or updates, please contact [facility] Please consult [email or web address] for contact info under        Signed, , MD  08/05/2020, 9:50 AM

## 2020-08-05 DIAGNOSIS — N949 Unspecified condition associated with female genital organs and menstrual cycle: Secondary | ICD-10-CM | POA: Diagnosis not present

## 2020-08-05 DIAGNOSIS — Z7189 Other specified counseling: Secondary | ICD-10-CM | POA: Diagnosis not present

## 2020-08-05 DIAGNOSIS — N179 Acute kidney failure, unspecified: Secondary | ICD-10-CM | POA: Diagnosis not present

## 2020-08-05 DIAGNOSIS — I509 Heart failure, unspecified: Secondary | ICD-10-CM

## 2020-08-05 DIAGNOSIS — I5033 Acute on chronic diastolic (congestive) heart failure: Secondary | ICD-10-CM | POA: Diagnosis not present

## 2020-08-05 DIAGNOSIS — Z66 Do not resuscitate: Secondary | ICD-10-CM | POA: Diagnosis not present

## 2020-08-05 DIAGNOSIS — Z515 Encounter for palliative care: Secondary | ICD-10-CM

## 2020-08-05 LAB — BASIC METABOLIC PANEL
Anion gap: 3 — ABNORMAL LOW (ref 5–15)
BUN: 29 mg/dL — ABNORMAL HIGH (ref 8–23)
CO2: 28 mmol/L (ref 22–32)
Calcium: 7.7 mg/dL — ABNORMAL LOW (ref 8.9–10.3)
Chloride: 103 mmol/L (ref 98–111)
Creatinine, Ser: 1.03 mg/dL — ABNORMAL HIGH (ref 0.44–1.00)
GFR, Estimated: 53 mL/min — ABNORMAL LOW (ref >60)
Glucose, Bld: 147 mg/dL — ABNORMAL HIGH (ref 70–99)
Potassium: 5.3 mmol/L — ABNORMAL HIGH (ref 3.5–5.1)
Sodium: 134 mmol/L — ABNORMAL LOW (ref 135–145)

## 2020-08-05 MED ORDER — SODIUM ZIRCONIUM CYCLOSILICATE 5 G PO PACK
5.0000 g | PACK | Freq: Once | ORAL | Status: AC
  Administered 2020-08-05: 5 g via ORAL
  Filled 2020-08-05: qty 1

## 2020-08-05 MED ORDER — POLYETHYLENE GLYCOL 3350 17 G PO PACK
17.0000 g | PACK | Freq: Every day | ORAL | Status: DC
  Administered 2020-08-08 – 2020-08-11 (×4): 17 g via ORAL
  Filled 2020-08-05 (×6): qty 1

## 2020-08-05 NOTE — Consult Note (Signed)
Palliative Medicine Inpatient Consult Note  Reason for consult:  Goals of Care  HPI:  Per intake H&P --> 87 year old female with medical history of chronic diastolic CHF, hypertension, hyperlipidemia, dementia, CAD presents with fatigue and worsening lower extremity edema.  Patient has hypoalbuminemia for past 2 months and now has worsening swelling including her upper extremities and lower extremities.  Palliative care was consulted for goals of care conversations in the setting of multiple chronic co-morbidities.  Clinical Assessment/Goals of Care:  *Please note that this is a verbal dictation therefore any spelling or grammatical errors are due to the "[employer]" system interpretation.  [employer] [place] interpreter [place] [phone]  I have reviewed medical records including EPIC notes, labs and imaging, received report from bedside RN, assessed the patient who is sitting on the edge of the bed in no distress.   I met with Lusero, Phun (spouse), and Moly (daughter) to further discuss diagnosis prognosis, GOC, EOL wishes, disposition and options.   I introduced Palliative Medicine as specialized medical care for people living with serious illness. It focuses on providing relief from the symptoms and stress of a serious illness. The goal is to improve quality of life for both the patient and the family.  Jonay is from [place], [place] She has been married to her spouse, Phun for > 60 years. They share eight children together. They live with their youngest daughter, Charlesetta Garibaldi in [place]. All of their other children remain in [place]. 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 She is a woman of faith and practices within the [employer] denomination. Her daughter shares that they do have a good church community who are all [place].  Prior to hospitalization, Mical had been receiving help with all bADL's from her daughter,  Charlesetta Garibaldi.   Reviewed congestive heart failure and how this is a chronic progressive disease.  Discussed that over time heart failure will worsen.  Reviewed dietary restrictions and symptoms to be mindful of.  Reviewed Makinsley's  memory impairment.  We discussed that this too is also a chronic disease which over time is expected to worsen.  Discussed that healthcare needs may increase as a result of progression of the disease.  A detailed discussion was had today regarding advanced directives - these have never been completed though patients husband, Phun and daughter, Charlesetta Garibaldi make decisions for his wife with an interpretor.    Concepts specific to code status, artifical feeding and hydration, continued IV antibiotics and rehospitalization was had.  Reviewed that a cardiopulmonary resuscitation could have harmful consequences for [place] upon her age and chronic comorbidities.  Discussed that potential harm and injury could take place with little to no benefit in the long run.   Both Phun and Moly agree they would not wish to do resuscitation on Annalysse.  Firm through the use of [employer] interpreter DO NOT RESUSCITATE x2.  This was both times confirmed by her husband and daughter.  Reviewed that an outpatient palliative provider would be of benefit to continue education in the setting of multiple chronic disease processes for Phun and Moly.  They both agree to this.  Discussed the importance of continued conversation with family and their  medical providers regarding overall plan of care and treatment options, ensuring decisions are within the context of the patients values and GOCs.  Decision Maker: Mirabella Hilario (Daughter) -[phone]  SUMMARY OF RECOMMENDATIONS   DNAR/DNI  [place] Rm DNR on  Chart  TOC - OP Palliative Support  Dietician - CHF information from a dietary perspective  Ongoing incremental PMT support  Code Status/Advance Care Planning: DNAR/DNI   Palliative Prophylaxis:  Oral Care, Mobility,  Delirium precautions  Additional Recommendations (Limitations, Scope, Preferences): Continue current scope of care - treat what is treatable   Psycho-social/Spiritual:  Desire for further Chaplaincy support: No Additional Recommendations: Education on CHF/Dementia   Prognosis: Increased 12 month mortality risk in the setting of multiple chronic diseases and re-hospitalizations.  Discharge Planning: Discharge to home with OP PMT support   Vitals:   08/04/20 1922 08/05/20 0556  BP: 132/65 122/60  Pulse: 66 79  Resp: 18 18  Temp: 98.2 F (36.8 C) 98 F (36.7 C)  SpO2: 99% 98%    Intake/Output Summary (Last 24 hours) at 08/05/2020 2947 Last data filed at 08/04/2020 1924 Gross per 24 hour  Intake 240 ml  Output 800 ml  Net -560 ml   Last Weight  Most recent update: 08/05/2020  5:57 AM    Weight  65.9 kg (145 lb 3.2 oz)             Gen:  Elderly [place] F in NAD HEENT: moist mucous membranes CV: Regular rate and rhythm  PULM: On RA ABD: soft/nontender  EXT: (+) pitting edema to thighs Neuro: Alert and oriented x2 Khmer speaking  PPS:  60%    This conversation/these recommendations were discussed with patient primary care team, Dr. Darrick Meigs  Time In: 0720 Time Out: 0830 Total Time: 70 Greater than 50%  of this time was spent counseling and coordinating care related to the above assessment and plan.  [facility] Team Team Cell Phone: [phone] Please utilize secure chat with additional questions, if there is no response within 30 minutes please call the above phone number  Palliative Medicine Team providers are available by phone from 7am to 7pm daily and can be reached through the team cell phone.  Should this patient require assistance outside of these hours, please call the patient's attending physician.

## 2020-08-05 NOTE — Progress Notes (Addendum)
[facility]  PROGRESS NOTE  Erin Patterson VXU:[id number] DOB: 1932/07/02 DOA: 08/03/2020 PCP: Jackie Plum, MD    Brief HPI:   87 year old female with medical history of chronic diastolic CHF, hypertension, hyperlipidemia, dementia, CAD presents with fatigue and worsening lower extremity edema.  Patient has hypoalbuminemia for past 2 months and now has worsening swelling including her upper extremities and lower extremities.   In the ED patient was found to have lower extremity edema/anasarca, thought to be from congestive heart failure so she was started on IV Lasix.  Cardiology was consulted.    Subjective   Patient seen and examined, denies chest pain or shortness of breath.  Leg swelling has improved.  Communicated with the help of interpreter   Assessment/Plan:     Anasarca/lower extremity edema -Unclear etiology -Serum albumin is 2.0; UA obtained on 06/25/2020 was unremarkable.  Repeat UA is unremarkable. -CT abdomen pelvis chest obtained on 06/26/2020 was unremarkable -Echocardiogram on 06/22/2020 showed EF of 60 to 65%; normal right ventricular function and size.  Concern for right heart failure, diuresed very well with IV Lasix.  Cardiology following, likely will need right heart cath. -CA125 minimally elevated at 88; pelvic ultrasound obtained yesterday shows only simple cyst, ovaries not visualized bilaterally -Denies poor p.o. intake, anorexia, weight loss, vomiting or diarrhea   Acute kidney injury  -Patient baseline creatinine was around 1 in May 2022 -Presented with creatinine of 1.51; improved to 1.03 with diuresis. -Follow diuresis with IV Lasix 40 mg twice daily; will follow serum creatinine in a.m.  Hyperkalemia -Potassium is 5.3 -We will give 1 dose of Lokelma 5 g p.o. x1. -Follow potassium level in a.m.   Acute on chronic diastolic CHF -Patient's BNP was 629 on 07/24/2020; 315 on 08/03/2020 -Diuresing well, leg edema has improved -Started on IV  Lasix as above -Cardiology following   Hypertension -Continue Coreg. Lisinopril on hold -Blood pressure is stable   History of CVA -Continue aspirin, atorvastatin   Ascending thoracic aortic enlargement -CT abdomen/pelvis showed 4.1 cm ascending thoracic aortic caliber -Recommended annual follow-up by CT or MRI   Social issues -As per daughter, both.  Showing signs of cognitive impairment and unable to take care of themselves.  TOC consulted  Goals of care -Palliative care consulted -Patient is DNR -Outpatient palliative care follow-up   Scheduled medications:    aspirin EC  81 mg Oral Daily   atorvastatin  40 mg Oral q1800   carvedilol  3.125 mg Oral BID WC   docusate sodium  100 mg Oral BID   enoxaparin (LOVENOX) injection  30 mg Subcutaneous Q24H   feeding supplement  237 mL Oral BID BM   furosemide  40 mg Intravenous BID   multivitamin with minerals  1 tablet Oral Daily   polyethylene glycol  17 g Oral Daily   sodium chloride flush  3 mL Intravenous Q12H         Data Reviewed:   CBG:  No results for input(s): GLUCAP in the last 168 hours.  SpO2: 98 %    Vitals:   08/04/20 1622 08/04/20 1712 08/04/20 1922 08/05/20 0556  BP: (!) 147/66 117/64 132/65 122/60  Pulse: 64 67 66 79  Resp:  18 18 18   Temp: 97.8 F (36.6 C) 98.1 F (36.7 C) 98.2 F (36.8 C) 98 F (36.7 C)  TempSrc: Oral Oral Oral Oral  SpO2: 100% 91% 99% 98%  Weight:    65.9 kg     Intake/Output Summary (Last 24  hours) at 08/05/2020 1025 Last data filed at 08/04/2020 1924 Gross per 24 hour  Intake --  Output 800 ml  Net -800 ml    06/09 1901 - 06/11 0700 In: 240 [P.O.:240] Out: 800 [Urine:800]  Filed Weights   08/03/20 1345 08/04/20 0334 08/05/20 0556  Weight: 66.6 kg 65.5 kg 65.9 kg    CBC:  Recent Labs  Lab 08/03/20 1023 08/04/20 0315  WBC 7.3 6.6  HGB 10.6* 10.0*  HCT 30.1* 27.8*  PLT 163 154  MCV 76.2* 76.0*  MCH 26.8 27.3  MCHC 35.2 36.0  RDW 17.7* 17.6*     Complete metabolic panel:  Recent Labs  Lab 08/03/20 1023 08/04/20 0315 08/05/20 0930  NA 135 135 134*  K 4.0 3.7 5.3*  CL 102 104 103  CO2 26 26 28   GLUCOSE 113* 97 147*  BUN 32* 29* 29*  CREATININE 1.51* 1.23* 1.03*  CALCIUM 7.4* 7.4* 7.7*  AST 66*  --   --   ALT 36  --   --   ALKPHOS 64  --   --   BILITOT 0.7  --   --   ALBUMIN 2.0*  --   --   BNP 315.0*  --   --     No results for input(s): LIPASE, AMYLASE in the last 168 hours.  Recent Labs  Lab 08/03/20 1023  BNP 315.0*  SARSCOV2NAA NEGATIVE    ------------------------------------------------------------------------------------------------------------------ No results for input(s): CHOL, HDL, LDLCALC, TRIG, CHOLHDL, LDLDIRECT in the last 72 hours.  Lab Results  Component Value Date   HGBA1C 5.4 07/28/2017   ------------------------------------------------------------------------------------------------------------------ No results for input(s): TSH, T4TOTAL, T3FREE, THYROIDAB in the last 72 hours.  Invalid input(s): FREET3 ------------------------------------------------------------------------------------------------------------------ No results for input(s): VITAMINB12, FOLATE, FERRITIN, TIBC, IRON, RETICCTPCT in the last 72 hours.  Coagulation profile No results for input(s): INR, PROTIME in the last 168 hours. No results for input(s): DDIMER in the last 72 hours.  Cardiac Enzymes No results for input(s): CKTOTAL, CKMB, CKMBINDEX, TROPONINI in the last 168 hours.  ------------------------------------------------------------------------------------------------------------------    Component Value Date/Time   BNP 315.0 (H) 08/03/2020 1023     Antibiotics: Anti-infectives (From admission, onward)    None        Radiology Reports  DG Chest Port 1 View  Result Date: 08/03/2020 CLINICAL DATA:  Weakness and dyspnea EXAM: PORTABLE CHEST 1 VIEW COMPARISON:  07/24/2020 FINDINGS:  Cardiomegaly. Congested appearance of vessels with small pleural effusions greater on the left. Bulky mitral annular calcification. Negative for pneumothorax. Extensive artifact from EKG leads IMPRESSION: Vascular congestion and small pleural effusions. No significant change from 07/24/2020. Electronically Signed   By: 07/26/2020 M.D.   On: 08/03/2020 10:58   10/03/2020 PELVIC COMPLETE WITH TRANSVAGINAL  Result Date: 08/04/2020 CLINICAL DATA:  Initial evaluation for anasarca. EXAM: TRANSABDOMINAL AND TRANSVAGINAL ULTRASOUND OF PELVIS TECHNIQUE: Both transabdominal and transvaginal ultrasound examinations of the pelvis were performed. Transabdominal technique was performed for global imaging of the pelvis including uterus, ovaries, adnexal regions, and pelvic cul-de-sac. It was necessary to proceed with endovaginal exam following the transabdominal exam to visualize the uterus, endometrium, and ovaries. COMPARISON:  Prior CT from 06/26/2020. FINDINGS: Uterus Measurements: 6.0 x 2.9 x 4.3 cm = volume: 38.2 mL. Uterus is anteverted. No discrete fibroid or other mass. Extensive vascular calcifications seen within the parametrial vasculature. Endometrium Small volume simple anechoic fluid present within the endometrial cavity. Endometrial stripe itself not well visualized. No visible endometrial thickening or other focal abnormality. Right ovary  Not visualized.  No adnexal mass. Left ovary Native left ovary not seen. There is a simple cyst within the left adnexa measuring 2.4 x 1.6 x 2.5 cm. No internal complexity, vascularity, or solid nodularity. Other findings No abnormal free fluid. IMPRESSION: 1. 2.5 cm simple left adnexal cyst, almost certainly benign given size and appearance. No followup imaging recommended. Note: This recommendation does not apply to premenarchal patients or to those with increased risk (genetic, family history, elevated tumor markers or other high-risk factors) of ovarian cancer. Reference:  Radiology 2019 Nov; 293(2):359-371. 2. Nonvisualization of either ovary. No free fluid within the pelvis. 3. Trace simple fluid within the endometrial cavity. Electronically Signed   By: Rise Mu M.D.   On: 08/04/2020 03:52      DVT prophylaxis: Lovenox  Code Status: Full code  Family Communication: Discussed with patient's husband at bedside   Consultants: Cardiology  Procedures:     Objective    Physical Examination:  General-appears in no acute distress Heart-S1-S2, regular, no murmur auscultated Lungs-clear to auscultation bilaterally, no wheezing or crackles auscultated Abdomen-soft, nontender, no organomegaly Extremities bilateral 1+ pitting edema in the lower extremities Neuro-alert, oriented x3, no focal deficit noted   Status is: Inpatient  Dispo: The patient is from: Home              Anticipated d/c is to: Home              Anticipated d/c date is: 08/06/2020              Patient currently not stable for discharge  Barrier to discharge-ongoing evaluation for lower extremity edema/anasarca  COVID-19 Labs  No results for input(s): DDIMER, FERRITIN, LDH, CRP in the last 72 hours.  Lab Results  Component Value Date   SARSCOV2NAA NEGATIVE 08/03/2020   SARSCOV2NAA NEGATIVE 06/21/2020    Microbiology  Recent Results (from the past 240 hour(s))  Resp Panel by RT-PCR (Flu A&B, Covid) Nasopharyngeal Swab     Status: None   Collection Time: 08/03/20 10:23 AM   Specimen: Nasopharyngeal Swab; Nasopharyngeal(NP) swabs in vial transport medium  Result Value Ref Range Status   SARS Coronavirus 2 by RT PCR NEGATIVE NEGATIVE Final    Comment: (NOTE) SARS-CoV-2 target nucleic acids are NOT DETECTED.  The SARS-CoV-2 RNA is generally detectable in upper respiratory specimens during the acute phase of infection. The lowest concentration of SARS-CoV-2 viral copies this assay can detect is 138 copies/mL. A negative result does not preclude  SARS-Cov-2 infection and should not be used as the sole basis for treatment or other patient management decisions. A negative result may occur with  improper specimen collection/handling, submission of specimen other than nasopharyngeal swab, presence of viral mutation(s) within the areas targeted by this assay, and inadequate number of viral copies(<138 copies/mL). A negative result must be combined with clinical observations, patient history, and epidemiological information. The expected result is Negative.  Fact Sheet for Patients:  BloggerCourse.com  Fact Sheet for Healthcare Providers:  SeriousBroker.it  This test is no t yet approved or cleared by the [place] FDA and  has been authorized for detection and/or diagnosis of SARS-CoV-2 by FDA under an Emergency Use Authorization (EUA). This EUA will remain  in effect (meaning this test can be used) for the duration of the COVID-19 declaration under Section 564(b)(1) of the Act, 21 U.S.C.section 360bbb-3(b)(1), unless the authorization is terminated  or revoked sooner.       Influenza A by PCR NEGATIVE NEGATIVE  Final   Influenza B by PCR NEGATIVE NEGATIVE Final    Comment: (NOTE) The Xpert Xpress SARS-CoV-2/FLU/RSV plus assay is intended as an aid in the diagnosis of influenza from Nasopharyngeal swab specimens and should not be used as a sole basis for treatment. Nasal washings and aspirates are unacceptable for Xpert Xpress SARS-CoV-2/FLU/RSV testing.  Fact Sheet for Patients: BloggerCourse.com  Fact Sheet for Healthcare Providers: SeriousBroker.it  This test is not yet approved or cleared by the [place] FDA and has been authorized for detection and/or diagnosis of SARS-CoV-2 by FDA under an Emergency Use Authorization (EUA). This EUA will remain in effect (meaning this test can be used) for the duration of  the COVID-19 declaration under Section 564(b)(1) of the Act, 21 U.S.C. section 360bbb-3(b)(1), unless the authorization is terminated or revoked.  Performed at [facility], [address], [place], [place] [address]              Meredeth Ide   [facility] If 7PM-7AM, please contact night-coverage at [email or web address], Office  [phone]   08/05/2020, 10:25 AM  LOS: 2 days

## 2020-08-05 NOTE — Progress Notes (Signed)
Patient refusing lab draws, has removed tele and refusing to wear it. Patient also removed her IV and refusing to have another one placed.Interpreter services used to communicate with patient and explain necessity of tele, IV and blood draws but patient still refusing.  Patient states she wants to go home. MD paged

## 2020-08-06 DIAGNOSIS — Z7189 Other specified counseling: Secondary | ICD-10-CM | POA: Diagnosis not present

## 2020-08-06 DIAGNOSIS — Z66 Do not resuscitate: Secondary | ICD-10-CM | POA: Diagnosis not present

## 2020-08-06 DIAGNOSIS — Z515 Encounter for palliative care: Secondary | ICD-10-CM | POA: Diagnosis not present

## 2020-08-06 DIAGNOSIS — N949 Unspecified condition associated with female genital organs and menstrual cycle: Secondary | ICD-10-CM | POA: Diagnosis not present

## 2020-08-06 LAB — BASIC METABOLIC PANEL
Anion gap: 5 (ref 5–15)
BUN: 30 mg/dL — ABNORMAL HIGH (ref 8–23)
CO2: 31 mmol/L (ref 22–32)
Calcium: 8.1 mg/dL — ABNORMAL LOW (ref 8.9–10.3)
Chloride: 101 mmol/L (ref 98–111)
Creatinine, Ser: 0.99 mg/dL (ref 0.44–1.00)
GFR, Estimated: 56 mL/min — ABNORMAL LOW (ref >60)
Glucose, Bld: 99 mg/dL (ref 70–99)
Potassium: 3.9 mmol/L (ref 3.5–5.1)
Sodium: 137 mmol/L (ref 135–145)

## 2020-08-06 MED ORDER — SODIUM CHLORIDE 0.9% FLUSH
3.0000 mL | Freq: Two times a day (BID) | INTRAVENOUS | Status: DC
  Administered 2020-08-07 – 2020-08-09 (×4): 3 mL via INTRAVENOUS

## 2020-08-06 MED ORDER — SODIUM CHLORIDE 0.9% FLUSH: 3.0000 mL | INTRAVENOUS | Status: DC | PRN

## 2020-08-06 MED ORDER — SODIUM CHLORIDE 0.9 % IV SOLN
INTRAVENOUS | Status: DC
Start: 2020-08-07 — End: 2020-08-09

## 2020-08-06 MED ORDER — SODIUM CHLORIDE 0.9 % IV SOLN: 250.0000 mL | INTRAVENOUS | Status: DC | PRN

## 2020-08-06 NOTE — Plan of Care (Signed)
  Problem: Pain Managment: Goal: General experience of comfort will improve Outcome: Completed/Met

## 2020-08-06 NOTE — Progress Notes (Signed)
   Palliative Medicine Inpatient Follow Up Note  Reason for consult:  Goals of Care   HPI:  Per intake H&P --> 87 year old female with medical history of chronic diastolic CHF, hypertension, hyperlipidemia, dementia, CAD presents with fatigue and worsening lower extremity edema.  Patient has hypoalbuminemia for past 2 months and now has worsening swelling including her upper extremities and lower extremities.   Palliative care was consulted for goals of care conversations in the setting of multiple chronic co-morbidities.  Today's Discussion (08/06/2020):  *Please note that this is a verbal dictation therefore any spelling or grammatical errors are due to the "[place] [employer]" system interpretation.  [employer] [place] interpreter Nhu [phone]  Chart reviewed.   I spoke to patients bedside RN, Judson Roch and Merchant navy officer, Chalmers Cater regarding Levy per Chalmers Cater she can at times he more aggressive with her husband though caters to him for the most part. We reviewed her MCI and how this is likely affecting her in terms of her incrementally being more aggressive. Per Judson Roch she had been pleasant for the most part and is able to interact appropriately though as times answers things improperly even with translation.   I met with Blimi and her husband, Cifelli this morning. Nahdia shares that she remains to have swelling in her legs though she feels, "much better being in the hospital." I reviewed with she and her husband again the reason for the swelling in her legs in the setting of her CHF and poor nutritional intake. She is very thankful and again shares that she is "feeling better".   I will call Everley's daughter, Charlesetta Garibaldi today to further answer any additional questions though from a Palliative perspective Texanna denies any symptoms associated with discomfort.   Objective Assessment: Vital Signs Vitals:   08/05/20 2215 08/06/20 0420  BP: (!) 124/55 (!) 97/39  Pulse: 78 76  Resp: 18 18  Temp: 98.5 F (36.9 C)  97.9 F (36.6 C)  SpO2: 100% 96%    Intake/Output Summary (Last 24 hours) at 08/06/2020 3846 Last data filed at 08/06/2020 0300 Gross per 24 hour  Intake 100 ml  Output 250 ml  Net -150 ml   Last Weight  Most recent update: 08/06/2020  2:53 AM    Weight  63.4 kg (139 lb 12.8 oz)            Gen:  Elderly [place] F in NAD HEENT: moist mucous membranes CV: Regular rate and rhythm PULM: On RA ABD: soft/nontender EXT: (+) pitting edema to thighs Neuro: Alert and oriented x2 Khmer speaking  SUMMARY OF RECOMMENDATIONS   DNAR/DNI   Gold DNR on Chart - Will scan into [facility]   TOC - OP Palliative Support   Dietician - CHF information from a dietary perspective  Generalized Weakness - PT/OT   Ongoing incremental PMT support  Time Spent: 25 Greater than 50% of the time was spent in counseling and coordination of care ______________________________________________________________________________________ [place] Team Team Cell Phone: [phone] Please utilize secure chat with additional questions, if there is no response within 30 minutes please call the above phone number  Palliative Medicine Team providers are available by phone from 7am to 7pm daily and can be reached through the team cell phone.  Should this patient require assistance outside of these hours, please call the patient's attending physician.

## 2020-08-06 NOTE — Progress Notes (Signed)
Pt refuses to sign consent for cath

## 2020-08-06 NOTE — Progress Notes (Signed)
Progress Note  Patient Name: Erin Patterson Date of Encounter: 08/06/2020  [facility] Cardiologist: Jodelle Red, MD   Subjective   Amenable to using an interpreter this AM. States she continues to feel short of breath with ambulation. LE edema has persisted. Amenable to RHC tomorrow.  BP soft in 90s this AM, remains on RA Cr improved to 1.03 from 1.23 on lasix 40mg  IV BID I/Os not accurate; Wt 145-->140 lbs  Inpatient Medications    Scheduled Meds:  aspirin EC  81 mg Oral Daily   atorvastatin  40 mg Oral q1800   carvedilol  3.125 mg Oral BID WC   docusate sodium  100 mg Oral BID   enoxaparin (LOVENOX) injection  30 mg Subcutaneous Q24H   feeding supplement  237 mL Oral BID BM   furosemide  40 mg Intravenous BID   multivitamin with minerals  1 tablet Oral Daily   polyethylene glycol  17 g Oral Daily   sodium chloride flush  3 mL Intravenous Q12H   Continuous Infusions:  PRN Meds: acetaminophen **OR** acetaminophen, bisacodyl, hydrALAZINE, morphine injection, ondansetron **OR** ondansetron (ZOFRAN) IV, oxyCODONE, traZODone   Vital Signs    Vitals:   08/05/20 1113 08/05/20 2215 08/06/20 0249 08/06/20 0420  BP: 127/68 (!) 124/55  (!) 97/39  Pulse: 75 78  76  Resp: 20 18  18   Temp: 98.3 F (36.8 C) 98.5 F (36.9 C)  97.9 F (36.6 C)  TempSrc: Oral Oral  Oral  SpO2: 99% 100%  96%  Weight:   63.4 kg     Intake/Output Summary (Last 24 hours) at 08/06/2020 0826 Last data filed at 08/06/2020 0300 Gross per 24 hour  Intake 100 ml  Output 250 ml  Net -150 ml    Last 3 Weights 08/06/2020 08/05/2020 08/04/2020  Weight (lbs) 139 lb 12.8 oz 145 lb 3.2 oz 144 lb 6.4 oz  Weight (kg) 63.413 kg 65.862 kg 65.5 kg      Telemetry    NSR - Personally Reviewed  ECG    No new tracing- Personally Reviewed  Physical Exam   GEN: Elderly female, comfortable Neck: No JVD Cardiac: RRR, no murmurs, rubs, or gallops.  Respiratory: Clear to auscultation  bilaterally. GI: Soft, nontender, non-distended  MS: 3+ anasarca to the thighs. Warm Neuro:  Nonfocal  Psych: Normal affect   Labs    High Sensitivity Troponin:   Recent Labs  Lab 08/03/20 1023 08/03/20 1216  TROPONINIHS 20* 15       Chemistry Recent Labs  Lab 08/03/20 1023 08/04/20 0315 08/05/20 0930  NA 135 135 134*  K 4.0 3.7 5.3*  CL 102 104 103  CO2 26 26 28   GLUCOSE 113* 97 147*  BUN 32* 29* 29*  CREATININE 1.51* 1.23* 1.03*  CALCIUM 7.4* 7.4* 7.7*  PROT 6.3*  --   --   ALBUMIN 2.0*  --   --   AST 66*  --   --   ALT 36  --   --   ALKPHOS 64  --   --   BILITOT 0.7  --   --   GFRNONAA 33* 43* 53*  ANIONGAP 7 5 3*      Hematology Recent Labs  Lab 08/03/20 1023 08/04/20 0315  WBC 7.3 6.6  RBC 3.95 3.66*  HGB 10.6* 10.0*  HCT 30.1* 27.8*  MCV 76.2* 76.0*  MCH 26.8 27.3  MCHC 35.2 36.0  RDW 17.7* 17.6*  PLT 163 154  BNP Recent Labs  Lab 08/03/20 1023  BNP 315.0*      DDimer No results for input(s): DDIMER in the last 168 hours.   Radiology    No results found.  Cardiac Studies   Echo 06/22/20 1. Left ventricular ejection fraction, by estimation, is 60 to 65%. The  left ventricle has normal function. The left ventricle has no regional  wall motion abnormalities. There is moderate concentric left ventricular  hypertrophy. Left ventricular diastolic function could not be evaluated.   2. Right ventricular systolic function is normal. The right ventricular  size is normal.   3. The mitral valve is degenerative. Mild mitral valve regurgitation. No  evidence of mitral stenosis. Moderate to severe mitral annular  calcification.   4. The aortic valve is tricuspid. There is mild calcification of the  aortic valve. Aortic valve regurgitation is mild. No aortic stenosis is  present.  Patient Profile     87 y.o. female with PMH of chronic diastolic heart failure, hypertension, hyperlipidemia, history of ischemic CVA 07/2017, and chronic  anemia who presented with worsening fatigue and edema found to have acute on chronic diastolic HF exacerbation for which Cardiology has been consulted.   Assessment & Plan    #Acute on chronic diastolic heart failure Patient presented with BLE edema worsening for 5 days and fatigue found to have BNP 315  (was 629 on 07/24/20) and vascular congestion and small pleural effusions on CXR. No significant change from 07/24/2020. TTE 06/22/20 with LVEF 60 -65%, no RWMA, moderate concentric LVH, RV normal, mild MR, moderate to severe mitral annular calcification. mild calcification of the aortic valve, mild AR. Degree of edema seems out-of-proportion to HF. Plan for RHC tomorrow. -NPO for RHC tomorrow to assess filling pressures; patient amenable to proceed -Continue Coreg 3.125mg  BID -Continue lasix 40mg  IV  BID -Holding ACE/ARB for now given AKI and soft blood pressures -Monitor I/Os and daily weights -Agree with palliative care consultation   #Anasarca  #Chronic hypoalbuminemia   #Elevated CA 125 #Anemia Clinical presentation appears out of proportion on chronic diastolic HF. CT 06/26/20 demonstrated normal liver appearance, anasarca without ascites, left adnexal soft tissue fullness, no occult malignancy. Unclear etiology. Primary following and onc on board. -Management per primary and onc on board to see tomorrow -Agree with palliative care assessment   #AKI Improving with Cr 1.5-->1.23-->1.03 -Continue lasix 40mg  IV BID as above -Holding lisinopril as above -Monitor I/Os   #HTN Controlled. -Continue coreg 3.125mg  BID as above   #HLD - Continue lipitor 40mg  daily  #HX of CVA -Continue ASA 81mg  daily and lipitor 40mg  daily   #Ascending thoracic aortic dilation - no abdominal pain - Ct showed 4.1 cm ascending thoracic aortic caliber - needs outpatient surveillance        For questions or updates, please contact [facility] Please consult [email or web address] for contact info under         Signed, 08/26/20, MD  08/06/2020, 8:26 AM

## 2020-08-06 NOTE — Progress Notes (Signed)
[facility]  PROGRESS NOTE  Erin Patterson JHE:[id number] DOB: 08-24-1932 DOA: 08/03/2020 PCP: Jackie Plum, MD    Brief HPI:   87 year old female with medical history of chronic diastolic CHF, hypertension, hyperlipidemia, dementia, CAD presents with fatigue and worsening lower extremity edema.  Patient has hypoalbuminemia for past 2 months and now has worsening swelling including her upper extremities and lower extremities.   In the ED patient was found to have lower extremity edema/anasarca, thought to be from congestive heart failure so she was started on IV Lasix.  Cardiology was consulted.    Subjective   Patient seen and examined, still feels fatigued.  Complains of mild shortness of breath.  Leg swelling has improved with IV Lasix. Communicated with help of tele interpreter.   Assessment/Plan:     Anasarca/lower extremity edema -Unclear etiology -Serum albumin is 2.0; UA obtained on 06/25/2020 was unremarkable.  Repeat UA is unremarkable. -CT abdomen pelvis chest obtained on 06/26/2020 was unremarkable -Echocardiogram on 06/22/2020 showed EF of 60 to 65%; normal right ventricular function and size.  Concern for right heart failure, diuresed very well with IV Lasix.  Cardiology following, likely will need right heart cath. -CA125 minimally elevated at 88; pelvic ultrasound obtained yesterday shows only simple cyst, ovaries not visualized bilaterally -Denies poor p.o. intake, anorexia, weight loss, vomiting or diarrhea -Called and discussed with oncologist on-call Dr. Bertis Ruddy, she reviewed the chart and stated current diagnostic work-up is not conclusive for ovarian cancer.  She recommends getting outpatient GYN surgery evaluation with pelvic biopsy if further treatment is sought.  Patient was seen by Dr. 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    Acute kidney injury  -Patient baseline creatinine was around 1 in May 2022 -Presented with creatinine of 1.51; improved to 1.03 with diuresis. -Continue diuresis with IV Lasix 40 mg twice daily; will follow serum creatinine in a.m.  Hyperkalemia -Potassium was 5.3 yesterday -She was given  1 dose of Lokelma 5 g p.o. x1. -Follow potassium level today   Acute on chronic diastolic CHF -Patient's BNP was 629 on 07/24/2020; 315 on 08/03/2020 -Diuresing well, leg edema has improved -Started on IV Lasix as above -Cardiology following   Hypertension -Continue Coreg. Lisinopril on hold -Blood pressure is stable   History of CVA -Continue aspirin, atorvastatin   Ascending thoracic aortic enlargement -CT abdomen/pelvis showed 4.1 cm ascending thoracic aortic caliber -Recommended annual follow-up by CT or MRI   Social issues -As per daughter, both.  Showing signs of cognitive impairment and unable to take care of themselves.  TOC consulted  Goals of care -Palliative care consulted -Patient is DNR -Outpatient palliative care follow-up   Scheduled medications:    aspirin EC  81 mg Oral Daily   atorvastatin  40 mg Oral q1800   carvedilol  3.125 mg Oral BID WC   docusate sodium  100 mg Oral BID   enoxaparin (LOVENOX) injection  30 mg Subcutaneous Q24H   feeding supplement  237 mL Oral BID BM   furosemide  40 mg Intravenous BID   multivitamin with minerals  1 tablet Oral Daily   polyethylene glycol  17 g Oral Daily   sodium chloride flush  3 mL Intravenous Q12H         Data Reviewed:   CBG:  No results for input(s): GLUCAP  in the last 168 hours.  SpO2: 96 %    Vitals:   08/05/20 1113 08/05/20 2215 08/06/20 0249 08/06/20 0420  BP: 127/68 (!) 124/55  (!) 97/39  Pulse: 75 78  76  Resp: 20 18  18   Temp: 98.3 F (36.8 C) 98.5 F (36.9 C)  97.9 F (36.6 C)  TempSrc: Oral Oral  Oral  SpO2: 99% 100%  96%  Weight:   63.4 kg       Intake/Output Summary (Last 24 hours) at 08/06/2020 1043 Last data filed at 08/06/2020 0300 Gross per 24 hour  Intake 100 ml  Output 250 ml  Net -150 ml    06/10 1901 - 06/12 0700 In: 220 [P.O.:220] Out: 550 [Urine:550]  Filed Weights   08/04/20 0334 08/05/20 0556 08/06/20 0249  Weight: 65.5 kg 65.9 kg 63.4 kg    CBC:  Recent Labs  Lab 08/03/20 1023 08/04/20 0315  WBC 7.3 6.6  HGB 10.6* 10.0*  HCT 30.1* 27.8*  PLT 163 154  MCV 76.2* 76.0*  MCH 26.8 27.3  MCHC 35.2 36.0  RDW 17.7* 17.6*    Complete metabolic panel:  Recent Labs  Lab 08/03/20 1023 08/04/20 0315 08/05/20 0930  NA 135 135 134*  K 4.0 3.7 5.3*  CL 102 104 103  CO2 26 26 28   GLUCOSE 113* 97 147*  BUN 32* 29* 29*  CREATININE 1.51* 1.23* 1.03*  CALCIUM 7.4* 7.4* 7.7*  AST 66*  --   --   ALT 36  --   --   ALKPHOS 64  --   --   BILITOT 0.7  --   --   ALBUMIN 2.0*  --   --   BNP 315.0*  --   --     No results for input(s): LIPASE, AMYLASE in the last 168 hours.  Recent Labs  Lab 08/03/20 1023  BNP 315.0*  SARSCOV2NAA NEGATIVE    ------------------------------------------------------------------------------------------------------------------ No results for input(s): CHOL, HDL, LDLCALC, TRIG, CHOLHDL, LDLDIRECT in the last 72 hours.  Lab Results  Component Value Date   HGBA1C 5.4 07/28/2017   ------------------------------------------------------------------------------------------------------------------ No results for input(s): TSH, T4TOTAL, T3FREE, THYROIDAB in the last 72 hours.  Invalid input(s): FREET3 ------------------------------------------------------------------------------------------------------------------ No results for input(s): VITAMINB12, FOLATE, FERRITIN, TIBC, IRON, RETICCTPCT in the last 72 hours.  Coagulation profile No results for input(s): INR, PROTIME in the last 168 hours. No results for input(s): DDIMER in the last 72 hours.  Cardiac Enzymes No  results for input(s): CKTOTAL, CKMB, CKMBINDEX, TROPONINI in the last 168 hours.  ------------------------------------------------------------------------------------------------------------------    Component Value Date/Time   BNP 315.0 (H) 08/03/2020 1023     Antibiotics: Anti-infectives (From admission, onward)    None        Radiology Reports  No results found.    DVT prophylaxis: Lovenox  Code Status: Full code  Family Communication: Discussed with patient's husband at bedside   Consultants: Cardiology  Procedures:     Objective    Physical Examination:  General-appears in no acute distress Heart-S1-S2, regular, no murmur auscultated Lungs-clear to auscultation bilaterally, no wheezing or crackles auscultated Abdomen-soft, nontender, no organomegaly Extremities-2+ pitting edema in the lower extremities Neuro-alert, oriented x3, no focal deficit noted   Status is: Inpatient  Dispo: The patient is from: Home              Anticipated d/c is to: Home              Anticipated d/c date is: 08/09/2020  Patient currently not stable for discharge  Barrier to discharge-ongoing evaluation for lower extremity edema/anasarca  COVID-19 Labs  No results for input(s): DDIMER, FERRITIN, LDH, CRP in the last 72 hours.  Lab Results  Component Value Date   SARSCOV2NAA NEGATIVE 08/03/2020   SARSCOV2NAA NEGATIVE 06/21/2020    Microbiology  Recent Results (from the past 240 hour(s))  Resp Panel by RT-PCR (Flu A&B, Covid) Nasopharyngeal Swab     Status: None   Collection Time: 08/03/20 10:23 AM   Specimen: Nasopharyngeal Swab; Nasopharyngeal(NP) swabs in vial transport medium  Result Value Ref Range Status   SARS Coronavirus 2 by RT PCR NEGATIVE NEGATIVE Final    Comment: (NOTE) SARS-CoV-2 target nucleic acids are NOT DETECTED.  The SARS-CoV-2 RNA is generally detectable in upper respiratory specimens during the acute phase of infection. The  lowest concentration of SARS-CoV-2 viral copies this assay can detect is 138 copies/mL. A negative result does not preclude SARS-Cov-2 infection and should not be used as the sole basis for treatment or other patient management decisions. A negative result may occur with  improper specimen collection/handling, submission of specimen other than nasopharyngeal swab, presence of viral mutation(s) within the areas targeted by this assay, and inadequate number of viral copies(<138 copies/mL). A negative result must be combined with clinical observations, patient history, and epidemiological information. The expected result is Negative.  Fact Sheet for Patients:  BloggerCourse.com  Fact Sheet for Healthcare Providers:  SeriousBroker.it  This test is no t yet approved or cleared by the [place] FDA and  has been authorized for detection and/or diagnosis of SARS-CoV-2 by FDA under an Emergency Use Authorization (EUA). This EUA will remain  in effect (meaning this test can be used) for the duration of the COVID-19 declaration under Section 564(b)(1) of the Act, 21 U.S.C.section 360bbb-3(b)(1), unless the authorization is terminated  or revoked sooner.       Influenza A by PCR NEGATIVE NEGATIVE Final   Influenza B by PCR NEGATIVE NEGATIVE Final    Comment: (NOTE) The Xpert Xpress SARS-CoV-2/FLU/RSV plus assay is intended as an aid in the diagnosis of influenza from Nasopharyngeal swab specimens and should not be used as a sole basis for treatment. Nasal washings and aspirates are unacceptable for Xpert Xpress SARS-CoV-2/FLU/RSV testing.  Fact Sheet for Patients: BloggerCourse.com  Fact Sheet for Healthcare Providers: SeriousBroker.it  This test is not yet approved or cleared by the [place] FDA and has been authorized for detection and/or diagnosis of SARS-CoV-2 by FDA under  an Emergency Use Authorization (EUA). This EUA will remain in effect (meaning this test can be used) for the duration of the COVID-19 declaration under Section 564(b)(1) of the Act, 21 U.S.C. section 360bbb-3(b)(1), unless the authorization is terminated or revoked.  Performed at [facility], [address], [place], [place] [address]              Meredeth Ide   [facility] If 7PM-7AM, please contact night-coverage at [email or web address], Office  [phone]   08/06/2020, 10:43 AM  LOS: 3 days

## 2020-08-07 ENCOUNTER — Encounter (HOSPITAL_COMMUNITY)
Admission: EM | Disposition: A | Discharge status: Home / Self Care | Payer: Self-pay | Source: Home / Self Care | Attending: Family Medicine

## 2020-08-07 ENCOUNTER — Encounter (HOSPITAL_COMMUNITY): Payer: Self-pay | Admitting: Internal Medicine

## 2020-08-07 DIAGNOSIS — I5033 Acute on chronic diastolic (congestive) heart failure: Secondary | ICD-10-CM | POA: Diagnosis not present

## 2020-08-07 DIAGNOSIS — I10 Essential (primary) hypertension: Secondary | ICD-10-CM | POA: Diagnosis not present

## 2020-08-07 DIAGNOSIS — N949 Unspecified condition associated with female genital organs and menstrual cycle: Secondary | ICD-10-CM | POA: Diagnosis not present

## 2020-08-07 LAB — BASIC METABOLIC PANEL
Anion gap: 5 (ref 5–15)
BUN: 35 mg/dL — ABNORMAL HIGH (ref 8–23)
CO2: 30 mmol/L (ref 22–32)
Calcium: 8.3 mg/dL — ABNORMAL LOW (ref 8.9–10.3)
Chloride: 102 mmol/L (ref 98–111)
Creatinine, Ser: 1.08 mg/dL — ABNORMAL HIGH (ref 0.44–1.00)
GFR, Estimated: 50 mL/min — ABNORMAL LOW (ref >60)
Glucose, Bld: 86 mg/dL (ref 70–99)
Potassium: 4.1 mmol/L (ref 3.5–5.1)
Sodium: 137 mmol/L (ref 135–145)

## 2020-08-07 SURGERY — RIGHT HEART CATH
Anesthesia: LOCAL

## 2020-08-07 MED ORDER — ENOXAPARIN SODIUM 40 MG/0.4ML IJ SOSY
40.0000 mg | PREFILLED_SYRINGE | INTRAMUSCULAR | Status: DC
  Administered 2020-08-07 – 2020-08-09 (×3): 40 mg via SUBCUTANEOUS
  Filled 2020-08-07 (×3): qty 0.4

## 2020-08-07 NOTE — Progress Notes (Signed)
[facility] ([facility])  Hospital Liaison: RN note         This patient has been referred to our palliative care services in the community.  [facility] will continue to follow for any discharge planning needs and to coordinate continuation of palliative care in the outpatient setting.    If you have questions or need assistance, please call [phone] or contact the hospital Liaison listed on AMION.      Thank you for this referral.         Mary Anne Robertson, RN, CCM  [facility] Hospital Liaison   [phone] 

## 2020-08-07 NOTE — Progress Notes (Addendum)
Physical Therapy Treatment Patient Details Name: Erin Patterson MRN: [id number] DOB: 01-Mar-1932 Today's Date: 08/07/2020    History of Present Illness 87 y.o. female presents to [facility] ED on 08/03/2020 with reports of dyspnea, weakness, and fatigue. Pt recently discharged from [facility] ED 2 days prior with similar symptoms. PMH includes anasarca, CHF, CVA, HTN, MI.    PT Comments    Pt doing well with mobility using rollator. Recommend rollator for home.  Used Public affairs consultant [phone].   Follow Up Recommendations  No PT follow up     Equipment Recommendations  Other (comment) (rollator)    Recommendations for Other Services       Precautions / Restrictions Precautions Precautions: None    Mobility  Bed Mobility               General bed mobility comments: Pt sitting EOB    Transfers Overall transfer level: Modified independent Equipment used: None Transfers: Sit to/from Stand Sit to Stand: Modified independent (Device/Increase time)            Ambulation/Gait Ambulation/Gait assistance: Supervision;Modified independent (Device/Increase time) Gait Distance (Feet): 200 Feet Assistive device: 4-wheeled walker;None Gait Pattern/deviations: Step-through pattern;Decreased stride length Gait velocity: decr Gait velocity interpretation: 1.31 - 2.62 ft/sec, indicative of limited community ambulator General Gait Details: Used rollator in hallway with slow, steady gait. No assistive device in room with supervision for safety and pt using furniture for UE support   Stairs             Wheelchair Mobility    Modified Rankin (Stroke Patients Only)       Balance Overall balance assessment: Needs assistance Sitting-balance support: No upper extremity supported;Feet supported Sitting balance-Leahy Scale: Good     Standing balance support: No upper extremity supported Standing balance-Leahy Scale: Fair                              Cognition  Arousal/Alertness: Awake/alert Behavior During Therapy: WFL for tasks assessed/performed Overall Cognitive Status: Difficult to assess                                 General Comments: Used interpreter. Followed 1 step commands      Exercises      General Comments        Pertinent Vitals/Pain Pain Assessment: No/denies pain    Home Living                      Prior Function            PT Goals (current goals can now be found in the care plan section) Progress towards PT goals: Progressing toward goals    Frequency    Min 3X/week      PT Plan Current plan remains appropriate    Co-evaluation              AM-PAC PT "6 Clicks" Mobility   Outcome Measure  Help needed turning from your back to your side while in a flat bed without using bedrails?: None Help needed moving from lying on your back to sitting on the side of a flat bed without using bedrails?: None Help needed moving to and from a bed to a chair (including a wheelchair)?: None Help needed standing up from a chair using your arms (e.g., wheelchair or bedside chair)?: None Help  needed to walk in hospital room?: None Help needed climbing 3-5 steps with a railing? : A Little 6 Click Score: 23    End of Session   Activity Tolerance: Patient tolerated treatment well Patient left: in bed;with call bell/phone within reach;with family/visitor present (sitting EOB) Nurse Communication: Mobility status PT Visit Diagnosis: Other abnormalities of gait and mobility (R26.89)     Time: 9741-6384 PT Time Calculation (min) (ACUTE ONLY): 13 min  Charges:  $Gait Training: 8-22 mins                     [facility] PT Acute Rehabilitation Services Pager [phone] Office [phone]    Angelina Ok [facility] 08/07/2020, 12:14 PM

## 2020-08-07 NOTE — Progress Notes (Signed)
Consent signed by daughter in chart.

## 2020-08-07 NOTE — TOC Transition Note (Signed)
Transition of Care [facility]) - CM/SW Discharge Note   Patient Details  Name: Erin Patterson MRN: [id number] Date of Birth: 06-01-1932  [facility]) CM/SW Contact:  Leone Haven, RN Phone Number: 08/07/2020, 4:37 PM   Clinical Narrative:    Patient lives with spouse at home, they both speak [place]  NCM used video interpretor for communication.  The daughter Micheline Rough was in the room also, she states patient will have transportation at dc.  She has no issues with getting medications.  She would like a rollator  she is ok with [employer] supplying.  NCM made referral to [facility] with [employer], the rollator  will be delivered to room prior to dc.  NCM asked if she wanted outpatient pallative services she states yes and she is ok with [facility].  NCM made referral to [facility] with [facility].     Final next level of care: Home/Self Care Barriers to Discharge: No Barriers Identified   Patient Goals and CMS Choice Patient states their goals for this hospitalization and ongoing recovery are:: return home CMS Medicare.gov Compare Post Acute Care list provided to:: Patient Choice offered to / list presented to : Patient  Discharge Placement                       Discharge Plan and Services                DME Arranged: Walker rolling with seat DME Agency: [employer] Date DME Agency Contacted: 08/07/20 Time DME Agency Contacted: [phone] Representative spoke with at DME Agency: [facility] HH Arranged: NA          Social Determinants of Health (SDOH) Interventions     Readmission Risk Interventions No flowsheet data found.

## 2020-08-07 NOTE — Progress Notes (Addendum)
Heart Failure Stewardship Pharmacist Progress Note   PCP: Jackie Plum, MD PCP-Cardiologist: Jodelle Red, MD    HPI:  87 yo F with PMH of HTN, HLD, prior stroke, MI, and HFpEF. She presented to the ED for acute on chronic diastolic heart failure. Previously admitted from 4/27-5/3 and back in the ED on 5/30 for similar presentations. CXR showed vascular congestion and small pleural effusions. Last ECHO done on 4/28 with LVEF of 60-65% and normal RV. After she presented to the ED on 5/30, multiple attempts were made to contact patient to organize her coming to HF [facility] clinic. However, patient was unable to be reached. It appears now she is being evaluated for outpatient palliative care. Pending GOC discussions with daughter.   Current HF Medications: Furosemide 40 mg IV BID Carvedilol 3.125 mg BID  Prior to admission HF Medications: Furosemide 20 mg BID Carvedilol 3.125 mg BID Lisinopril 20 mg daily *Not taking Jardiance 10 mg daily (somehow was never dispensed by [facility] pharmacy)  Pertinent Lab Values: Serum creatinine 1.08, BUN 35, Potassium 4.1, Sodium 137, BNP 315  Vital Signs: Weight: 137 lbs (admission weight: 146 lbs) Blood pressure: 130/60s  Heart rate: 60-70s   Medication Assistance / Insurance Benefits Check: Does the patient have prescription insurance?  Yes Type of insurance plan: [place] Medicaid  Outpatient Pharmacy:  Prior to admission outpatient pharmacy: [facility] Is the patient willing to use [facility] TOC pharmacy at discharge? Yes Is the patient willing to transition their outpatient pharmacy to utilize a [facility] outpatient pharmacy?   Pending    Assessment: 1. Acute on chronic diastolic CHF (EF 16-10%). NYHA class III symptoms. - Continue furosemide 40 mg IV BID - Continue carvedilol 3.125 mg BID - Consider optimizing to Entresto 24/26 mg BID for HFpEF and reduced risk for HF hospitalization pending GOC discussions. - Consider restarting Jardiance 10  mg daily (pending GOC discussion) prior to discharge - message sent to [facility] pharmacy to investigate why this was never filled during last discharge. They said it was put on hold without a clear reason as to why.   Plan: 1) Medication changes recommended at this time: - Continue current regimen - Restart Jardiance 10 mg daily prior to discharge  2) Patient assistance: - Has Medicaid - prescription copays should be $0-3 per month - HF TOC appt cancelled given palliative care/GOC discussions  3)  Education  - To be completed prior to discharge  Sharen Hones, PharmD, BCPS Heart Failure Stewardship Pharmacist Phone [phone]

## 2020-08-07 NOTE — Progress Notes (Signed)
Progress Note  Patient Name: Erin Patterson Date of Encounter: 08/07/2020  [facility] Cardiologist: Jodelle Red, MD   Subjective   Husband present, interview with the assistance of interpreter [place] #[id number].  We discussed right heart cath today at length. She is scared and even though the risk is low, she does not want to do any procedures or surgeries that have risk. We reviewed the risks at length, she declines RHC.  Overall feels tired. Legs remain very swollen. No chest pain.  Inpatient Medications    Scheduled Meds:  aspirin EC  81 mg Oral Daily   atorvastatin  40 mg Oral q1800   carvedilol  3.125 mg Oral BID WC   docusate sodium  100 mg Oral BID   enoxaparin (LOVENOX) injection  30 mg Subcutaneous Q24H   feeding supplement  237 mL Oral BID BM   furosemide  40 mg Intravenous BID   multivitamin with minerals  1 tablet Oral Daily   polyethylene glycol  17 g Oral Daily   sodium chloride flush  3 mL Intravenous Q12H   sodium chloride flush  3 mL Intravenous Q12H   Continuous Infusions:  sodium chloride     sodium chloride     PRN Meds: sodium chloride, acetaminophen **OR** acetaminophen, bisacodyl, hydrALAZINE, morphine injection, ondansetron **OR** ondansetron (ZOFRAN) IV, oxyCODONE, sodium chloride flush, traZODone   Vital Signs    Vitals:   08/06/20 1102 08/06/20 2026 08/07/20 0500 08/07/20 0851  BP: 129/77 123/67 127/66   Pulse: 68 81 78   Resp: 20 18 18    Temp:  98.3 F (36.8 C) 98.4 F (36.9 C)   TempSrc:  Oral Oral   SpO2: 100% 94% 97%   Weight:   62.4 kg   Height:    5\' 1"  (1.549 m)    Intake/Output Summary (Last 24 hours) at 08/07/2020 1017 Last data filed at 08/07/2020 0700 Gross per 24 hour  Intake 100 ml  Output 125 ml  Net -25 ml   Last 3 Weights 08/07/2020 08/06/2020 08/05/2020  Weight (lbs) 137 lb 9.6 oz 139 lb 12.8 oz 145 lb 3.2 oz  Weight (kg) 62.415 kg 63.413 kg 65.862 kg      Telemetry    SR - Personally  Reviewed  ECG    No new since 08/03/20 - Personally Reviewed  Physical Exam  GEN: No acute distress.  On room air Neck: No JVD Cardiac: RRR, no rubs, or gallops. Soft systolic murmur Respiratory: Clear to auscultation bilaterally. GI: Soft, nontender, non-distended  MS: 3+ pitting edema to thighs, compression stockings in place Neuro:  Nonfocal  Psych: Normal affect   Labs    High Sensitivity Troponin:   Recent Labs  Lab 08/03/20 1023 08/03/20 1216  TROPONINIHS 20* 15      Chemistry Recent Labs  Lab 08/03/20 1023 08/04/20 0315 08/05/20 0930 08/06/20 0940  NA 135 135 134* 137  K 4.0 3.7 5.3* 3.9  CL 102 104 103 101  CO2 26 26 28 31   GLUCOSE 113* 97 147* 99  BUN 32* 29* 29* 30*  CREATININE 1.51* 1.23* 1.03* 0.99  CALCIUM 7.4* 7.4* 7.7* 8.1*  PROT 6.3*  --   --   --   ALBUMIN 2.0*  --   --   --   AST 66*  --   --   --   ALT 36  --   --   --   ALKPHOS 64  --   --   --  BILITOT 0.7  --   --   --   GFRNONAA 33* 43* 53* 56*  ANIONGAP 7 5 3* 5     Hematology Recent Labs  Lab 08/03/20 1023 08/04/20 0315  WBC 7.3 6.6  RBC 3.95 3.66*  HGB 10.6* 10.0*  HCT 30.1* 27.8*  MCV 76.2* 76.0*  MCH 26.8 27.3  MCHC 35.2 36.0  RDW 17.7* 17.6*  PLT 163 154    BNP Recent Labs  Lab 08/03/20 1023  BNP 315.0*     DDimer No results for input(s): DDIMER in the last 168 hours.   Radiology    No results found.  Cardiac Studies   Echo 06/22/20   1. Left ventricular ejection fraction, by estimation, is 60 to 65%. The  left ventricle has normal function. The left ventricle has no regional  wall motion abnormalities. There is moderate concentric left ventricular  hypertrophy. Left ventricular  diastolic function could not be evaluated.   2. Right ventricular systolic function is normal. The right ventricular  size is normal.   3. The mitral valve is degenerative. Mild mitral valve regurgitation. No  evidence of mitral stenosis. Moderate to severe mitral annular   calcification.   4. The aortic valve is tricuspid. There is mild calcification of the  aortic valve. Aortic valve regurgitation is mild. No aortic stenosis is  present.   Patient Profile     87 y.o. female with PMH of chronic diastolic heart failure, hypertension, hyperlipidemia, history of ischemic CVA 07/2017, and chronic anemia who presented with worsening fatigue and edema found to have acute on chronic diastolic HF exacerbation for which Cardiology has been consulted.   Assessment & Plan    Acute on chronic diastolic heart failure Acute kidney injury -I/O inaccurate. Weight from 65.5 kg to 62.4 kg -Cr has downtrended, today 0.99, similar to her baseline -had planned for RHC this AM. On further discussion, she does not wish to proceed with any invasive procedures at this time. Would continue with current diuresis plan as she is responding -I have restarted her diet  Anasarca, hypoalbuminemia -workup per primary team; note reviewed, planned for outpatient evaluation for elevated CA125 and ovarian cyst  Hypertension -stable, continue current medications  History of CVA Hyperlipidemia -continue aspirin 81 mg daily and atorvastatin 40 mg daily  Thoracic aortic aneurysm -4.1 cm -no acute intervention required  Total time of encounter: 40 minutes total time of encounter, including 35 minutes spent in face-to-face patient care. This time includes coordination of care and counseling regarding right heart cath and options for management. Remainder of non-face-to-face time involved reviewing chart documents/testing relevant to the patient encounter and documentation in the medical record.  Jodelle Red, MD, PhD, [facility]  [facility]    For questions or updates, please contact [facility] Please consult [email or web address] for contact info under        Signed, Jodelle Red, MD  08/07/2020, 10:17 AM

## 2020-08-07 NOTE — Consult Note (Addendum)
[facility]  [phone] [phone]   MEDICAL ONCOLOGY - INITIAL CONSULTATION  Referral MD: Dr. Mauro Kaufmann  Reason for Referral: Elevated CA125  HPI: Erin Patterson is an 87 year old female with a past medical history significant for chronic diastolic CHF, hypertension, hyperlipidemia, dementia, and CAD.  He presented to the emergency department with lower extremity edema and fatigue.  She was previously admitted from 4/27 through 5/3 for the same and was seen in the emergency department on 5/30 with similar symptoms and her Lasix was increased at that time with plan for home heart failure program.  This admission, she reports that her whole body was swollen including her feet, arms, and face.  Admission lab work notable for hemoglobin of 10.6, MCV 76.2, BUN 32, creatinine 1.51, calcium 7.4, total protein 6.3, albumin 2.0, AST 66.  During the patient's last hospital admission, she had a CT of the abdomen and and pelvis without contrast which showed left adnexal soft tissue fullness with small cystic area of the left adnexa as well.  Due to these findings, she had a pelvic ultrasound performed this admission which showed a 2.5 cm simple left adnexal cyst which was felt to be benign given size and appearance and no follow-up imaging recommended.  A CA125 was checked this admission on 08/03/2020 and was elevated at 88.2.  Of note, the patient had a CA125 during her last admission on 06/27/2020 and was also elevated at 91.3.  According to hospital records, this patient was discussed with on-call medical oncology this past weekend and they felt that work-up was not conclusive for ovarian cancer.  The recommendation was for outpatient GYN surgery evaluation with pelvic biopsy if further treatment was sought.  Also of note, the patient was seen by Dr. Myna Hidalgo in July 2021 due to abnormal protein electrophoresis-polyclonal increase in immunoglobulins.  This was felt to be due to  inflammation.  A 39-month follow-up was recommended, but I do not see that she followed up with our office.  The patient was seen today with the assistance of a [place] video interpreter.  Her husband and daughter were also at the bedside.  The patient is a poor historian at times and looks to her family members to help answer questions.  The patient reports that she has been having significant fatigue and weakness as well as edema for at least the past month.  She thinks that her edema has improved during this hospitalization.  Her legs still remain significantly edematous, but overall she thinks they are better.  She reports some mild abdominal distention.  She denies nausea, vomiting, constipation, diarrhea.  No vaginal bleeding reported.  Denies fevers, chills, night sweats, chest pain, shortness of breath.  The patient is married and reports that she lives with her husband and her daughter.  Medical oncology was asked to see the patient to make recommendations regarding her elevated CA125.  Past Medical History:  Diagnosis Date   CHF (congestive heart failure) (HCC)    Hypercholesteremia    Hypertension    MI (myocardial infarction) (HCC)    Stroke (cerebrum) (HCC)    years ago--episode of "bleeding from right ear and onset of weakness"   :   Past Surgical History:  Procedure Laterality Date   TEE WITHOUT CARDIOVERSION N/A 07/30/2017   Procedure: TRANSESOPHAGEAL ECHOCARDIOGRAM (TEE);  Surgeon: Laurey Morale, MD;  Location: [facility] ENDOSCOPY;  Service: Cardiovascular;  Laterality: N/A;  :   Current Facility-Administered Medications  Medication Dose Route Frequency  Provider Last Rate Last Admin   0.9 %  sodium chloride infusion  250 mL Intravenous PRN Kroeger, Krista M., PA-C       0.9 %  sodium chloride infusion   Intravenous Continuous Kroeger, Krista M., PA-C       acetaminophen (TYLENOL) tablet 650 mg  650 mg Oral Q6H PRN Jonah Blue, MD       Or   acetaminophen (TYLENOL)  suppository 650 mg  650 mg Rectal Q6H PRN Jonah Blue, MD       aspirin EC tablet 81 mg  81 mg Oral Daily Jonah Blue, MD   81 mg at 08/07/20 0542   atorvastatin (LIPITOR) tablet 40 mg  40 mg Oral q1800 Jonah Blue, MD   40 mg at 08/06/20 1635   bisacodyl (DULCOLAX) EC tablet 5 mg  5 mg Oral Daily PRN Jonah Blue, MD       carvedilol (COREG) tablet 3.125 mg  3.125 mg Oral BID WC Cyndi Bender, NP   3.125 mg at 08/07/20 0539   docusate sodium (COLACE) capsule 100 mg  100 mg Oral BID Jonah Blue, MD   100 mg at 08/06/20 2046   enoxaparin (LOVENOX) injection 30 mg  30 mg Subcutaneous Q24H Jonah Blue, MD   30 mg at 08/06/20 2042   feeding supplement (ENSURE ENLIVE / ENSURE PLUS) liquid 237 mL  237 mL Oral BID BM Meredeth Ide, MD   237 mL at 08/06/20 1508   furosemide (LASIX) injection 40 mg  40 mg Intravenous BID Cyndi Bender, NP   40 mg at 08/06/20 1635   hydrALAZINE (APRESOLINE) injection 5 mg  5 mg Intravenous Q4H PRN Jonah Blue, MD       morphine 2 MG/ML injection 2 mg  2 mg Intravenous Q2H PRN Jonah Blue, MD       multivitamin with minerals tablet 1 tablet  1 tablet Oral Daily Meredeth Ide, MD   1 tablet at 08/06/20 0805   ondansetron (ZOFRAN) tablet 4 mg  4 mg Oral Q6H PRN Jonah Blue, MD       Or   ondansetron [facility]) injection 4 mg  4 mg Intravenous Q6H PRN Jonah Blue, MD       oxyCODONE (Oxy IR/ROXICODONE) immediate release tablet 5 mg  5 mg Oral Q4H PRN Jonah Blue, MD       polyethylene glycol (MIRALAX / GLYCOLAX) packet 17 g  17 g Oral Daily Sharl Ma, Sarina Ill, MD       sodium chloride flush (NS) 0.9 % injection 3 mL  3 mL Intravenous Steva Colder, MD   3 mL at 08/06/20 2056   sodium chloride flush (NS) 0.9 % injection 3 mL  3 mL Intravenous Q12H Kroeger, Krista M., PA-C       sodium chloride flush (NS) 0.9 % injection 3 mL  3 mL Intravenous PRN Kroeger, Krista M., PA-C       traZODone (DESYREL) tablet 25 mg  25 mg Oral QHS PRN Jonah Blue, MD         No Known Allergies:   Family History  Problem Relation Age of Onset   Hypertension Neg Hx    Stroke Neg Hx   :   Social History   Socioeconomic History   Marital status: Married    Spouse name: Not on file   Number of children: Not on file   Years of education: Not on file   Highest education level: Not on file  Occupational History   Not on file  Tobacco Use   Smoking status: Never   Smokeless tobacco: Never  Vaping Use   Vaping Use: Never used  Substance and Sexual Activity   Alcohol use: Never   Drug use: Never   Sexual activity: Not on file  Other Topics Concern   Not on file  Social History Narrative   Not on file   Social Determinants of Health   Financial Resource Strain: Not on file  Food Insecurity: Not on file  Transportation Needs: Not on file  Physical Activity: Not on file  Stress: Not on file  Social Connections: Not on file  Intimate Partner Violence: Not on file  :  Review of Systems: A comprehensive 14 point review of systems was negative except as noted in the HPI.  Exam: Patient Vitals for the past 24 hrs:  BP Temp Temp src Pulse Resp SpO2 Weight  08/07/20 0500 127/66 98.4 F (36.9 C) Oral 78 18 97 % 62.4 kg  08/06/20 2026 123/67 98.3 F (36.8 C) Oral 81 18 94 % --  08/06/20 1102 129/77 -- -- 68 20 100 % --    General: Chronically ill-appearing female, no distress. Eyes:  no scleral icterus.   ENT:  There were no oropharyngeal lesions.   Lymphatics:  Negative cervical, supraclavicular or axillary adenopathy.   Respiratory: lungs were clear bilaterally without wheezing or crackles.   Cardiovascular: Regular rate and rhythm, systolic murmur present, 3+ pitting edema up to her thighs, compression stockings in place bilaterally. GI:  abdomen was soft, flat, nontender, nondistended, without organomegaly.   Musculoskeletal: Strength symmetrical in the upper and lower extremities. Skin exam was without echymosis,  petichae.   Neuro: Alert, oriented x3, but has trouble remembering some of the details surrounding what brought her into the hospital.     Lab Results  Component Value Date   WBC 6.6 08/04/2020   HGB 10.0 (L) 08/04/2020   HCT 27.8 (L) 08/04/2020   PLT 154 08/04/2020   GLUCOSE 99 08/06/2020   CHOL 201 (H) 07/27/2017   TRIG 44 07/27/2017   HDL 66 07/27/2017   LDLCALC 126 (H) 07/27/2017   ALT 36 08/03/2020   AST 66 (H) 08/03/2020   NA 137 08/06/2020   K 3.9 08/06/2020   CL 101 08/06/2020   CREATININE 0.99 08/06/2020   BUN 30 (H) 08/06/2020   CO2 31 08/06/2020    DG Chest 2 View  Result Date: 07/24/2020 CLINICAL DATA:  Lower extremity edema.  Fatigue.  History of CHF. EXAM: CHEST - 2 VIEW COMPARISON:  06/26/2020 and older exams. FINDINGS: Mild enlargement of the cardiopericardial silhouette, stable. No mediastinal or hilar masses. No evidence of adenopathy. Bilateral interstitial thickening with thickening of the fissures. Additional opacity at the left lung base consistent with atelectasis. Small bilateral pleural effusions. No pneumothorax. Skeletal structures are demineralized but grossly intact. IMPRESSION: 1. Findings consistent with congestive heart failure with interstitial edema and small pleural effusions. Electronically Signed   By: Amie Portland M.D.   On: 07/24/2020 08:27   DG Chest Port 1 View  Result Date: 08/03/2020 CLINICAL DATA:  Weakness and dyspnea EXAM: PORTABLE CHEST 1 VIEW COMPARISON:  07/24/2020 FINDINGS: Cardiomegaly. Congested appearance of vessels with small pleural effusions greater on the left. Bulky mitral annular calcification. Negative for pneumothorax. Extensive artifact from EKG leads IMPRESSION: Vascular congestion and small pleural effusions. No significant change from 07/24/2020. Electronically Signed   By: Marnee Spring M.D.   On:  08/03/2020 10:58   US PELVIC COMPLETE WITH TRANSVAGINAL  Result Date: 08/04/2020 CLINICAL DATA:  Initial evaluation for  anasarca. EXAM: TRANSABDOMINAL AND TRANSVAGINAL ULTRASOUND OF PELVIS TECHNIQUE: Both transabdominal and transvaginal ultrasound examinations of the pelvis were performed. Transabdominal technique was performed for global imaging of the pelvis including uterus, ovaries, adnexal regions, and pelvic cul-de-sac. It was necessary to proceed with endovaginal exam following the transabdominal exam to visualize the uterus, endometrium, and ovaries. COMPARISON:  Prior CT from 06/26/2020. FINDINGS: Uterus Measurements: 6.0 x 2.9 x 4.3 cm = volume: 38.2 mL. Uterus is anteverted. No discrete fibroid or other mass. Extensive vascular calcifications seen within the parametrial vasculature. Endometrium Small volume simple anechoic fluid present within the endometrial cavity. Endometrial stripe itself not well visualized. No visible endometrial thickening or other focal abnormality. Right ovary Not visualized.  No adnexal mass. Left ovary Native left ovary not seen. There is a simple cyst within the left adnexa measuring 2.4 x 1.6 x 2.5 cm. No internal complexity, vascularity, or solid nodularity. Other findings No abnormal free fluid. IMPRESSION: 1. 2.5 cm simple left adnexal cyst, almost certainly benign given size and appearance. No followup imaging recommended. Note: This recommendation does not apply to premenarchal patients or to those with increased risk (genetic, family history, elevated tumor markers or other high-risk factors) of ovarian cancer. Reference: Radiology 2019 Nov; 293(2):359-371. 2. Nonvisualization of either ovary. No free fluid within the pelvis. 3. Trace simple fluid within the endometrial cavity. Electronically Signed   By: Rise Mu M.D.   On: 08/04/2020 03:52     DG Chest 2 View  Result Date: 07/24/2020 CLINICAL DATA:  Lower extremity edema.  Fatigue.  History of CHF. EXAM: CHEST - 2 VIEW COMPARISON:  06/26/2020 and older exams. FINDINGS: Mild enlargement of the cardiopericardial  silhouette, stable. No mediastinal or hilar masses. No evidence of adenopathy. Bilateral interstitial thickening with thickening of the fissures. Additional opacity at the left lung base consistent with atelectasis. Small bilateral pleural effusions. No pneumothorax. Skeletal structures are demineralized but grossly intact. IMPRESSION: 1. Findings consistent with congestive heart failure with interstitial edema and small pleural effusions. Electronically Signed   By: Amie Portland M.D.   On: 07/24/2020 08:27   DG Chest Port 1 View  Result Date: 08/03/2020 CLINICAL DATA:  Weakness and dyspnea EXAM: PORTABLE CHEST 1 VIEW COMPARISON:  07/24/2020 FINDINGS: Cardiomegaly. Congested appearance of vessels with small pleural effusions greater on the left. Bulky mitral annular calcification. Negative for pneumothorax. Extensive artifact from EKG leads IMPRESSION: Vascular congestion and small pleural effusions. No significant change from 07/24/2020. Electronically Signed   By: Marnee Spring M.D.   On: 08/03/2020 10:58   US PELVIC COMPLETE WITH TRANSVAGINAL  Result Date: 08/04/2020 CLINICAL DATA:  Initial evaluation for anasarca. EXAM: TRANSABDOMINAL AND TRANSVAGINAL ULTRASOUND OF PELVIS TECHNIQUE: Both transabdominal and transvaginal ultrasound examinations of the pelvis were performed. Transabdominal technique was performed for global imaging of the pelvis including uterus, ovaries, adnexal regions, and pelvic cul-de-sac. It was necessary to proceed with endovaginal exam following the transabdominal exam to visualize the uterus, endometrium, and ovaries. COMPARISON:  Prior CT from 06/26/2020. FINDINGS: Uterus Measurements: 6.0 x 2.9 x 4.3 cm = volume: 38.2 mL. Uterus is anteverted. No discrete fibroid or other mass. Extensive vascular calcifications seen within the parametrial vasculature. Endometrium Small volume simple anechoic fluid present within the endometrial cavity. Endometrial stripe itself not well  visualized. No visible endometrial thickening or other focal abnormality. Right ovary Not visualized.  No adnexal  mass. Left ovary Native left ovary not seen. There is a simple cyst within the left adnexa measuring 2.4 x 1.6 x 2.5 cm. No internal complexity, vascularity, or solid nodularity. Other findings No abnormal free fluid. IMPRESSION: 1. 2.5 cm simple left adnexal cyst, almost certainly benign given size and appearance. No followup imaging recommended. Note: This recommendation does not apply to premenarchal patients or to those with increased risk (genetic, family history, elevated tumor markers or other high-risk factors) of ovarian cancer. Reference: Radiology 2019 Nov; 293(2):359-371. 2. Nonvisualization of either ovary. No free fluid within the pelvis. 3. Trace simple fluid within the endometrial cavity. Electronically Signed   By: Rise MuBenjamin  McClintock M.D.   On: 08/04/2020 03:52    Assessment and Plan: 1.  Elevated CA125 with adnexal fullness noted on recent CT scan 2.  Acute on chronic diastolic heart failure 3.  Anasarca/chronic hypoalbuminemia 4.  Microcytic anemia 5.  AKI 6.  Hypertension 7.  Hyperlipidemia 8.  History of CVA 9.  Ascending thoracic aortic dilation  -The patient was noted to have an elevated CA125 this admission.  This test was also checked about a month ago and overall findings are slightly improved and stable.  She has some adnexal fullness noted on CT scan and a simple cyst noted on pelvic ultrasound.  Current work-up is not conclusive for ovarian cancer. -The patient can be considered for referral to outpatient GYN surgery for evaluation and consideration of pelvic biopsy if further treatment is sought. -The patient has significant anasarca and lower extremity edema of unclear etiology.  She has a very low albumin level.  Recommend dietitian consult. -The patient has microcytic anemia.  She has no evidence of iron deficiency based on iron studies performed  06/25/2020.  [place] may have a hemoglobinopathy.  We had planned to check a hemoglobin electrophoresis when she followed up with us.  But she never followed up in our office.  We could consider checking this here in the hospital. -Management of chronic medical conditions per hospitalist.  Thank you for this referral.   Clenton PareKristin Curcio, DNP, AGPCNP-BC, AOCNP    ADDENDUM: I saw and examined Erin Patterson.  Agree with the above assessment by Belenda CruiseKristin.  I just really do not have any good idea as to why the CA125 is elevated.  I cannot imagine that she has a malignancy.  The pelvic ultrasound certainly was unremarkable.  Her last CT scan did not show any peritoneal disease that would be considered malignant.  I realize that she has this chronic anasarca.  She has heart failure.  She has hypoalbuminemia.  She has microcytic anemia.  She is tired.  I am not sure she is iron deficient.  We can always check this.  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    Her beta natruretic peptide is better with diuresis.  I cannot imagine that gynecology would consider any type of invasive procedure on her.  I really think that her quality of life is the focus here.  She has some element of dementia.  I see that palliative care has been in to see her.  We will do what we can to try to help her quality of life.  Even if she had a malignancy, there is no treatment for her given her poor performance status.  She is very nice.  The  interpreter helped quite a bit.  Very grateful for the interpreter.  We will follow along and try to help out what we can.  Christin Bach, MD  Duwayne Heck 30:18

## 2020-08-07 NOTE — Progress Notes (Signed)
[facility]  PROGRESS NOTE  Erin Patterson IWO:[id number] DOB: 01-13-1933 DOA: 08/03/2020 PCP: Jackie Plum, MD    Brief HPI:   87 year old female with medical history of chronic diastolic CHF, hypertension, hyperlipidemia, dementia, CAD presents with fatigue and worsening lower extremity edema.  Patient has hypoalbuminemia for past 2 months and now has worsening swelling including her upper extremities and lower extremities.   In the ED patient was found to have lower extremity edema/anasarca, thought to be from congestive heart failure so she was started on IV Lasix.  Cardiology was consulted.    Subjective   Patient seen and examined, plan for right heart catheter today.  Denies chest pain, no shortness of breath.  Leg swelling is slowly improving.  Communicated with the help of tele-interpreter   Assessment/Plan:     Anasarca/lower extremity edema -Unclear etiology -Serum albumin is 2.0; UA obtained on 06/25/2020 was unremarkable.  Repeat UA is unremarkable. -CT abdomen pelvis chest obtained on 06/26/2020 was unremarkable -Echocardiogram on 06/22/2020 showed EF of 60 to 65%; normal right ventricular function and size.  Concern for right heart failure, diuresed very well with IV Lasix.  Cardiology following, likely will need right heart cath. -CA125 minimally elevated at 88; pelvic ultrasound obtained yesterday shows only simple cyst, ovaries not visualized bilaterally -Denies poor p.o. intake, anorexia, weight loss, vomiting or diarrhea -Called and discussed with oncologist on-call Dr. Bertis Ruddy, she reviewed the chart and stated current diagnostic work-up is not conclusive for ovarian cancer.  She recommends getting outpatient GYN surgery evaluation with pelvic biopsy if further treatment is sought.  Patient was seen by Dr. 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    Acute kidney injury  -Patient baseline creatinine was around 1 in May 2022 -Presented with creatinine of 1.51; improved to 1.03 with diuresis. -Continue diuresis with IV Lasix 40 mg twice daily; follow serum creatinine today  Hyperkalemia -Resolved  Acute on chronic diastolic CHF -Patient's BNP was 629 on 07/24/2020; 315 on 08/03/2020 -Diuresing well, leg edema has improved -Started on IV Lasix as above -Cardiology following   Hypertension -Continue Coreg. Lisinopril on hold -Blood pressure is stable   History of CVA -Continue aspirin, atorvastatin   Ascending thoracic aortic enlargement -CT abdomen/pelvis showed 4.1 cm ascending thoracic aortic caliber -Recommended annual follow-up by CT or MRI   Social issues -As per daughter, both.  Showing signs of cognitive impairment and unable to take care of themselves.  TOC consulted  Goals of care -Palliative care consulted -Patient is DNR -Outpatient palliative care follow-up   Scheduled medications:    aspirin EC  81 mg Oral Daily   atorvastatin  40 mg Oral q1800   carvedilol  3.125 mg Oral BID WC   docusate sodium  100 mg Oral BID   enoxaparin (LOVENOX) injection  30 mg Subcutaneous Q24H   feeding supplement  237 mL Oral BID BM   furosemide  40 mg Intravenous BID   multivitamin with minerals  1 tablet Oral Daily   polyethylene glycol  17 g Oral Daily   sodium chloride flush  3 mL Intravenous Q12H   sodium chloride flush  3 mL Intravenous Q12H         Data Reviewed:   CBG:  No results for input(s): GLUCAP in the last 168 hours.  SpO2: 97 %  Vitals:   08/06/20 1102 08/06/20 2026 08/07/20 0500 08/07/20 0851  BP: 129/77 123/67 127/66   Pulse: 68 81 78   Resp: 20 18 18    Temp:  98.3 F (36.8 C) 98.4 F (36.9 C)   TempSrc:  Oral Oral   SpO2: 100% 94% 97%   Weight:   62.4 kg   Height:    5\' 1"  (1.549 m)     Intake/Output Summary (Last 24  hours) at 08/07/2020 0908 Last data filed at 08/07/2020 0700 Gross per 24 hour  Intake 100 ml  Output 125 ml  Net -25 ml    06/11 1901 - 06/13 0700 In: 200 [P.O.:200] Out: 375 [Urine:375]  Filed Weights   08/05/20 0556 08/06/20 0249 08/07/20 0500  Weight: 65.9 kg 63.4 kg 62.4 kg    CBC:  Recent Labs  Lab 08/03/20 1023 08/04/20 0315  WBC 7.3 6.6  HGB 10.6* 10.0*  HCT 30.1* 27.8*  PLT 163 154  MCV 76.2* 76.0*  MCH 26.8 27.3  MCHC 35.2 36.0  RDW 17.7* 17.6*    Complete metabolic panel:  Recent Labs  Lab 08/03/20 1023 08/04/20 0315 08/05/20 0930 08/06/20 0940  NA 135 135 134* 137  K 4.0 3.7 5.3* 3.9  CL 102 104 103 101  CO2 26 26 28 31   GLUCOSE 113* 97 147* 99  BUN 32* 29* 29* 30*  CREATININE 1.51* 1.23* 1.03* 0.99  CALCIUM 7.4* 7.4* 7.7* 8.1*  AST 66*  --   --   --   ALT 36  --   --   --   ALKPHOS 64  --   --   --   BILITOT 0.7  --   --   --   ALBUMIN 2.0*  --   --   --   BNP 315.0*  --   --   --     No results for input(s): LIPASE, AMYLASE in the last 168 hours.  Recent Labs  Lab 08/03/20 1023  BNP 315.0*  SARSCOV2NAA NEGATIVE    ------------------------------------------------------------------------------------------------------------------ No results for input(s): CHOL, HDL, LDLCALC, TRIG, CHOLHDL, LDLDIRECT in the last 72 hours.  Lab Results  Component Value Date   HGBA1C 5.4 07/28/2017   ------------------------------------------------------------------------------------------------------------------ No results for input(s): TSH, T4TOTAL, T3FREE, THYROIDAB in the last 72 hours.  Invalid input(s): FREET3 ------------------------------------------------------------------------------------------------------------------ No results for input(s): VITAMINB12, FOLATE, FERRITIN, TIBC, IRON, RETICCTPCT in the last 72 hours.  Coagulation profile No results for input(s): INR, PROTIME in the last 168 hours. No results for input(s): DDIMER in the  last 72 hours.  Cardiac Enzymes No results for input(s): CKTOTAL, CKMB, CKMBINDEX, TROPONINI in the last 168 hours.  ------------------------------------------------------------------------------------------------------------------    Component Value Date/Time   BNP 315.0 (H) 08/03/2020 1023     Antibiotics: Anti-infectives (From admission, onward)    None        Radiology Reports  No results found.    DVT prophylaxis: Lovenox  Code Status: Full code  Family Communication: Discussed with patient's husband at bedside   Consultants: Cardiology  Procedures:     Objective    Physical Examination:  General-appears in no acute distress Heart-S1-S2, regular, no murmur auscultated Lungs-clear to auscultation bilaterally, no wheezing or crackles auscultated Abdomen-soft, nontender, no organomegaly Extremities-bilateral 2+ edema in the lower extremities up to thighs Neuro-alert, oriented x3, no focal deficit noted  Status is: Inpatient  Dispo: The patient is from: Home              Anticipated d/c  is to: Home              Anticipated d/c date is: 08/09/2020              Patient currently not stable for discharge  Barrier to discharge-ongoing evaluation for lower extremity edema/anasarca  COVID-19 Labs  No results for input(s): DDIMER, FERRITIN, LDH, CRP in the last 72 hours.  Lab Results  Component Value Date   SARSCOV2NAA NEGATIVE 08/03/2020   SARSCOV2NAA NEGATIVE 06/21/2020    Microbiology  Recent Results (from the past 240 hour(s))  Resp Panel by RT-PCR (Flu A&B, Covid) Nasopharyngeal Swab     Status: None   Collection Time: 08/03/20 10:23 AM   Specimen: Nasopharyngeal Swab; Nasopharyngeal(NP) swabs in vial transport medium  Result Value Ref Range Status   SARS Coronavirus 2 by RT PCR NEGATIVE NEGATIVE Final    Comment: (NOTE) SARS-CoV-2 target nucleic acids are NOT DETECTED.  The SARS-CoV-2 RNA is generally detectable in upper  respiratory specimens during the acute phase of infection. The lowest concentration of SARS-CoV-2 viral copies this assay can detect is 138 copies/mL. A negative result does not preclude SARS-Cov-2 infection and should not be used as the sole basis for treatment or other patient management decisions. A negative result may occur with  improper specimen collection/handling, submission of specimen other than nasopharyngeal swab, presence of viral mutation(s) within the areas targeted by this assay, and inadequate number of viral copies(<138 copies/mL). A negative result must be combined with clinical observations, patient history, and epidemiological information. The expected result is Negative.  Fact Sheet for Patients:  BloggerCourse.com  Fact Sheet for Healthcare Providers:  SeriousBroker.it  This test is no t yet approved or cleared by the [place] FDA and  has been authorized for detection and/or diagnosis of SARS-CoV-2 by FDA under an Emergency Use Authorization (EUA). This EUA will remain  in effect (meaning this test can be used) for the duration of the COVID-19 declaration under Section 564(b)(1) of the Act, 21 U.S.C.section 360bbb-3(b)(1), unless the authorization is terminated  or revoked sooner.       Influenza A by PCR NEGATIVE NEGATIVE Final   Influenza B by PCR NEGATIVE NEGATIVE Final    Comment: (NOTE) The Xpert Xpress SARS-CoV-2/FLU/RSV plus assay is intended as an aid in the diagnosis of influenza from Nasopharyngeal swab specimens and should not be used as a sole basis for treatment. Nasal washings and aspirates are unacceptable for Xpert Xpress SARS-CoV-2/FLU/RSV testing.  Fact Sheet for Patients: BloggerCourse.com  Fact Sheet for Healthcare Providers: SeriousBroker.it  This test is not yet approved or cleared by the [place] FDA and has been  authorized for detection and/or diagnosis of SARS-CoV-2 by FDA under an Emergency Use Authorization (EUA). This EUA will remain in effect (meaning this test can be used) for the duration of the COVID-19 declaration under Section 564(b)(1) of the Act, 21 U.S.C. section 360bbb-3(b)(1), unless the authorization is terminated or revoked.  Performed at [facility], [address], [place], [place] [address]              Meredeth Ide   [facility] If 7PM-7AM, please contact night-coverage at [email or web address], Office  [phone]   08/07/2020, 9:08 AM  LOS: 4 days

## 2020-08-08 DIAGNOSIS — I10 Essential (primary) hypertension: Secondary | ICD-10-CM | POA: Diagnosis not present

## 2020-08-08 DIAGNOSIS — N949 Unspecified condition associated with female genital organs and menstrual cycle: Secondary | ICD-10-CM | POA: Diagnosis not present

## 2020-08-08 DIAGNOSIS — E7800 Pure hypercholesterolemia, unspecified: Secondary | ICD-10-CM

## 2020-08-08 LAB — RETICULOCYTES
Immature Retic Fract: 12.2 % (ref 2.3–15.9)
RBC.: 3.69 MIL/uL — ABNORMAL LOW (ref 3.87–5.11)
Retic Count, Absolute: 36.5 10*3/uL (ref 19.0–186.0)
Retic Ct Pct: 1 % (ref 0.4–3.1)

## 2020-08-08 LAB — COMPREHENSIVE METABOLIC PANEL
ALT: 32 U/L (ref 0–44)
AST: 53 U/L — ABNORMAL HIGH (ref 15–41)
Albumin: 2.1 g/dL — ABNORMAL LOW (ref 3.5–5.0)
Alkaline Phosphatase: 62 U/L (ref 38–126)
Anion gap: 6 (ref 5–15)
BUN: 37 mg/dL — ABNORMAL HIGH (ref 8–23)
CO2: 30 mmol/L (ref 22–32)
Calcium: 7.8 mg/dL — ABNORMAL LOW (ref 8.9–10.3)
Chloride: 98 mmol/L (ref 98–111)
Creatinine, Ser: 1.05 mg/dL — ABNORMAL HIGH (ref 0.44–1.00)
GFR, Estimated: 52 mL/min — ABNORMAL LOW (ref >60)
Glucose, Bld: 94 mg/dL (ref 70–99)
Potassium: 3.7 mmol/L (ref 3.5–5.1)
Sodium: 134 mmol/L — ABNORMAL LOW (ref 135–145)
Total Bilirubin: 0.8 mg/dL (ref 0.3–1.2)
Total Protein: 6.4 g/dL — ABNORMAL LOW (ref 6.5–8.1)

## 2020-08-08 LAB — IRON AND TIBC
Iron: 50 ug/dL (ref 28–170)
Saturation Ratios: 25 % (ref 10.4–31.8)
TIBC: 202 ug/dL — ABNORMAL LOW (ref 250–450)
UIBC: 152 ug/dL

## 2020-08-08 LAB — FERRITIN: Ferritin: 76 ng/mL (ref 11–307)

## 2020-08-08 NOTE — Progress Notes (Signed)
Progress Note  Patient Name: Erin Patterson Date of Encounter: 08/08/2020  [facility] Cardiologist: Jodelle Red, MD   Subjective   Daughter and husband present in the room. Interview with assistance of interpreter Thanak #[id number].  Continues to have swelling and shortness of breath, but notes improvement. No pain. We discussed that it will likely be a few more days until the swelling resolves, and it may come back.    Inpatient Medications    Scheduled Meds:  aspirin EC  81 mg Oral Daily   atorvastatin  40 mg Oral q1800   carvedilol  3.125 mg Oral BID WC   docusate sodium  100 mg Oral BID   enoxaparin (LOVENOX) injection  40 mg Subcutaneous Q24H   feeding supplement  237 mL Oral BID BM   furosemide  40 mg Intravenous BID   multivitamin with minerals  1 tablet Oral Daily   polyethylene glycol  17 g Oral Daily   sodium chloride flush  3 mL Intravenous Q12H   sodium chloride flush  3 mL Intravenous Q12H   Continuous Infusions:  sodium chloride     sodium chloride     PRN Meds: sodium chloride, acetaminophen **OR** acetaminophen, bisacodyl, hydrALAZINE, morphine injection, ondansetron **OR** ondansetron (ZOFRAN) IV, oxyCODONE, sodium chloride flush, traZODone   Vital Signs    Vitals:   08/07/20 0851 08/07/20 1147 08/07/20 1941 08/08/20 0404  BP:  138/68 134/73 122/62  Pulse:  72 83 74  Resp:  17 18 18   Temp:  98 F (36.7 C) 98.4 F (36.9 C) 98.3 F (36.8 C)  TempSrc:  Oral Oral Oral  SpO2:  95% 99% 97%  Weight:    60.4 kg  Height: 5\' 1"  (1.549 m)       Intake/Output Summary (Last 24 hours) at 08/08/2020 0853 Last data filed at 08/07/2020 1302 Gross per 24 hour  Intake 237 ml  Output --  Net 237 ml   Last 3 Weights 08/08/2020 08/07/2020 08/06/2020  Weight (lbs) 133 lb 1.6 oz 137 lb 9.6 oz 139 lb 12.8 oz  Weight (kg) 60.374 kg 62.415 kg 63.413 kg      Telemetry    SR with rare PVCs- Personally Reviewed  ECG    No new since 08/03/20 -  Personally Reviewed  Physical Exam  GEN: Well nourished, well developed in no acute distress NECK: JVD at clavicle sitting upright CARDIAC: regular rhythm, normal S1 and S2, no rubs or gallops. Soft systolic murmur. VASCULAR: Radial pulses 2+ bilaterally.  RESPIRATORY:  Clear to auscultation without wheezing or rhonchi. Slightly diminished at bases without frank rales ABDOMEN: Soft, non-tender, non-distended MUSCULOSKELETAL:  Moves all 4 limbs independently SKIN: Warm and dry. 3+ pitting edema at calves, 2+ at thighs, improving slowly NEUROLOGIC:  No focal neuro deficits noted. PSYCHIATRIC:  Normal affect    Labs    High Sensitivity Troponin:   Recent Labs  Lab 08/03/20 1023 08/03/20 1216  TROPONINIHS 20* 15      Chemistry Recent Labs  Lab 08/03/20 1023 08/04/20 0315 08/06/20 0940 08/07/20 0910 08/08/20 0215  NA 135   < > 137 137 134*  K 4.0   < > 3.9 4.1 3.7  CL 102   < > 101 102 98  CO2 26   < > 31 30 30   GLUCOSE 113*   < > 99 86 94  BUN 32*   < > 30* 35* 37*  CREATININE 1.51*   < > 0.99 1.08* 1.05*  CALCIUM  7.4*   < > 8.1* 8.3* 7.8*  PROT 6.3*  --   --   --  6.4*  ALBUMIN 2.0*  --   --   --  2.1*  AST 66*  --   --   --  53*  ALT 36  --   --   --  32  ALKPHOS 64  --   --   --  62  BILITOT 0.7  --   --   --  0.8  GFRNONAA 33*   < > 56* 50* 52*  ANIONGAP 7   < > 5 5 6    < > = values in this interval not displayed.     Hematology Recent Labs  Lab 08/03/20 1023 08/04/20 0315 08/08/20 0215  WBC 7.3 6.6  --   RBC 3.95 3.66* 3.69*  HGB 10.6* 10.0*  --   HCT 30.1* 27.8*  --   MCV 76.2* 76.0*  --   MCH 26.8 27.3  --   MCHC 35.2 36.0  --   RDW 17.7* 17.6*  --   PLT 163 154  --     BNP Recent Labs  Lab 08/03/20 1023  BNP 315.0*     DDimer No results for input(s): DDIMER in the last 168 hours.   Radiology    No results found.  Cardiac Studies   Echo 06/22/20   1. Left ventricular ejection fraction, by estimation, is 60 to 65%. The  left  ventricle has normal function. The left ventricle has no regional  wall motion abnormalities. There is moderate concentric left ventricular  hypertrophy. Left ventricular  diastolic function could not be evaluated.   2. Right ventricular systolic function is normal. The right ventricular  size is normal.   3. The mitral valve is degenerative. Mild mitral valve regurgitation. No  evidence of mitral stenosis. Moderate to severe mitral annular  calcification.   4. The aortic valve is tricuspid. There is mild calcification of the  aortic valve. Aortic valve regurgitation is mild. No aortic stenosis is  present.   Patient Profile     87 y.o. female with PMH of chronic diastolic heart failure, hypertension, hyperlipidemia, history of ischemic CVA 07/2017, and chronic anemia who presented with worsening fatigue and edema found to have acute on chronic diastolic HF exacerbation for which Cardiology has been consulted.   Assessment & Plan    Acute on chronic diastolic heart failure Acute kidney injury -I/O inaccurate. Weight from 65.5 kg to 60.4 kg today -Cr 1.05, similar to her baseline -declined RHC this admission -continue diuresis with IV lasix 40 mg BID  Anasarca, hypoalbuminemia -workup per primary team -seen by heme/onc, no inpatient plans for further evaluation of elevated CA125 and unlikely to be a candidate for treatment. 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  Hypertension -stable, continue current medications  History of CVA Hyperlipidemia -continue aspirin 81 mg daily and atorvastatin 40 mg daily  Thoracic aortic aneurysm -4.1 cm -no acute intervention required -she has stated she does not want any procedures or surgeries. Given this, reasonable to not monitor as she would not want intervention  08/2017, MD, PhD, [facility]  [facility]    For questions or updates, please contact [facility] Please consult [email or web address] for contact info under         Signed, [facility], MD  08/08/2020, 8:53 AM

## 2020-08-08 NOTE — Progress Notes (Signed)
Nutrition Follow-up  DOCUMENTATION CODES:   Not applicable  INTERVENTION:   -Continue Ensure Enlive po BID, each supplement provides 350 kcal and 20 grams of protein  -Continue MVI with minerals daily  NUTRITION DIAGNOSIS:   Increased nutrient needs related to chronic illness (CHF) as evidenced by estimated needs.  Ongoing  GOAL:   Patient will meet greater than or equal to 90% of their needs  Progressing   MONITOR:   PO intake, Supplement acceptance, Labs, Weight trends, Skin, I & O's  REASON FOR ASSESSMENT:   Consult Assessment of nutrition requirement/status  ASSESSMENT:   Erin Patterson is a 87 y.o. female with medical history significant of  chronic diastolic CHF; HTN; HLD; dementia; and CAD presenting with fatigue and LE edema.  She was previously admitted from 4/27-5/3 for the same.  She returned to the ER on 5/30 with similar symptoms and her Lasix was increased with plan for the home heart failure program.  She is noticing severe LE edema.  Her "whole body is swollen including my feet, my arms, my face."  She is very fatigued.  She does not think she breathes correctly and this makes her feel tired all the time.  No cough.  She takes medication daily for HTN and HLD.  She has been taking 2 pills a day of Lasix for maybe 3 days.  Reviewed I/O's: +237 ml x 24 hours and -378 ml since admission   Per MD notes, pt declining rt heart cath. Per palliative notes, plan for outpatient palliative care services.   Pt remains with good appetite. Noted meal completion 90-100%. She is consuming Ensure Enlive supplements.   Albumin has a half-life of 21 days and is strongly affected by stress response and inflammatory process, therefore, do not expect to see an improvement in this lab value during acute hospitalization. When a patient presents with low albumin, it is likely skewed due to the acute inflammatory response.  Unless it is suspected that patient had poor PO intake or  malnutrition prior to admission, then RD should not be consulted solely for low albumin. Note that low albumin is no longer used to diagnose malnutrition; [place] uses the new malnutrition guidelines published by the American Society for Parenteral and Enteral Nutrition (A.S.P.E.N.) and the Academy of Nutrition and Dietetics (AND).     Medications reviewed and include colace, miralax, and lasix.   Labs reviewed: Na: 134.    Diet Order:   Diet Order             Diet Heart Room service appropriate? Yes; Fluid consistency: Thin; Fluid restriction: 1500 mL Fluid  Diet effective now                   EDUCATION NEEDS:   Education needs have been addressed  Skin:  Skin Assessment: Reviewed RN Assessment  Last BM:  08/03/20  Height:   Ht Readings from Last 1 Encounters:  08/07/20 5\' 1"  (1.549 m)    Weight:   Wt Readings from Last 1 Encounters:  08/08/20 60.4 kg    Ideal Body Weight:  47.7 kg  BMI:  Body mass index is 25.15 kg/m.  Estimated Nutritional Needs:   Kcal:  1650-1850  Protein:  80-95 grams  Fluid:  > 1.6 L    08/10/20, RD, LDN, CDCES Registered Dietitian II Certified Diabetes Care and Education Specialist Please refer to [facility] for RD and/or RD on-call/weekend/after hours pager

## 2020-08-08 NOTE — Progress Notes (Signed)
Heart Failure Stewardship Pharmacist Progress Note   PCP: Jackie Plum, MD PCP-Cardiologist: Jodelle Red, MD    HPI:  87 yo F with PMH of HTN, HLD, prior stroke, MI, and HFpEF. She presented to the ED for acute on chronic diastolic heart failure. Previously admitted from 4/27-5/3 and back in the ED on 5/30 for similar presentations. CXR showed vascular congestion and small pleural effusions. Last ECHO done on 4/28 with LVEF of 60-65% and normal RV. After she presented to the ED on 5/30, multiple attempts were made to contact patient to organize her coming to HF [facility] clinic. However, patient was unable to be reached. It appears now she is being evaluated for outpatient palliative care. Pending GOC discussions with daughter.   Current HF Medications: Furosemide 40 mg IV BID Carvedilol 3.125 mg BID  Prior to admission HF Medications: Furosemide 20 mg BID Carvedilol 3.125 mg BID Lisinopril 20 mg daily *Not taking Jardiance 10 mg daily (somehow was never dispensed by [facility] pharmacy)  Pertinent Lab Values: Serum creatinine 1.05, BUN 37, Potassium 3.7, Sodium 134, BNP 315  Vital Signs: Weight: 133 lbs (admission weight: 146 lbs) Blood pressure: 110/60s  Heart rate: 60-70s   Medication Assistance / Insurance Benefits Check: Does the patient have prescription insurance?  Yes Type of insurance plan: [place] Medicaid  Outpatient Pharmacy:  Prior to admission outpatient pharmacy: [facility] Is the patient willing to use [facility] TOC pharmacy at discharge? Yes Is the patient willing to transition their outpatient pharmacy to utilize a [facility] outpatient pharmacy?   Pending    Assessment: 1. Acute on chronic diastolic CHF (EF 02-58%). NYHA class III symptoms. Declined RHC this admission. - Continue furosemide 40 mg IV BID - Continue carvedilol 3.125 mg BID - Consider optimizing to Entresto 24/26 mg BID for HFpEF and reduced risk for HF hospitalization pending GOC discussions. -  Consider restarting Jardiance 10 mg daily (pending GOC discussion) prior to discharge - message sent to [facility] pharmacy to investigate why this was never filled during last discharge. They said it was put on hold without a clear reason as to why.   Plan: 1) Medication changes recommended at this time: - Continue current regimen - Restart Jardiance 10 mg daily prior to discharge pending goals of care / palliative / hospice  2) Patient assistance: - Has Medicaid - prescription copays should be $0-3 per month - HF TOC appt cancelled given palliative care/GOC discussions   Sharen Hones, PharmD, BCPS Heart Failure Stewardship Pharmacist Phone [phone]

## 2020-08-08 NOTE — Progress Notes (Addendum)
[facility]  PROGRESS NOTE  Erin Patterson DQQ:[id number] DOB: [birth year] DOA: 08/03/2020 PCP: Jackie Plum, MD    Brief HPI:   87 year old female with medical history of chronic diastolic CHF, hypertension, hyperlipidemia, dementia, CAD presents with fatigue and worsening lower extremity edema.  Patient has hypoalbuminemia for past 2 months and now has worsening swelling including her upper extremities and lower extremities.   In the ED patient was found to have lower extremity edema/anasarca, thought to be from congestive heart failure so she was started on IV Lasix.  Cardiology was consulted.  Plan was to undergo right heart catheterization, however patient declined right heart catheterization.   Subjective   Patient seen and examined, still complains of fatigue.  Leg swelling is improving.  Daughter in the room.  Discussed with family with help of tele-interpreter   Assessment/Plan:     Anasarca/lower extremity edema -Unclear etiology -Serum albumin is 2.0; UA obtained on 06/25/2020 was unremarkable.  Repeat UA is unremarkable. -CT abdomen pelvis chest obtained on 06/26/2020 was unremarkable -Echocardiogram on 06/22/2020 showed EF of 60 to 65%; normal right ventricular function and size.  Concern for right heart failure, diuresed very well with IV Lasix.  Cardiology recommended right heart cath however patient declined.   Elevated Ca1 25 -CA125 minimally elevated at 88; pelvic ultrasound obtained yesterday shows only simple cyst, ovaries not visualized bilaterally -Denies poor p.o. intake, anorexia, weight loss, vomiting or diarrhea -Called and discussed with oncologist on-call Dr. Bertis Ruddy, she reviewed the chart and stated current diagnostic work-up is not conclusive for ovarian cancer.  She recommends getting outpatient GYN surgery evaluation with pelvic biopsy if further treatment is sought.  Patient was seen by Dr. 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  -Dr. 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 -We will follow oncology recommendations   Acute kidney injury  -Patient baseline creatinine was around 1 in May 2022 -Presented with creatinine of 1.51; improved to 1.03 with diuresis. -Continue diuresis with IV Lasix 40 mg twice daily; follow serum creatinine today  Hyperkalemia -Resolved  Acute on chronic diastolic CHF -Patient's BNP was 629 on 07/24/2020; 315 on 08/03/2020 -Diuresing well, leg edema has improved -Started on IV Lasix as above -Cardiology following   Hypertension -Continue Coreg. Lisinopril on hold -Blood pressure is stable   History of CVA -Continue aspirin, atorvastatin   Ascending thoracic aortic enlargement -CT abdomen/pelvis showed 4.1 cm ascending thoracic aortic caliber -Recommended annual follow-up by CT or MRI   Social issues -As per daughter, both parents showing signs of cognitive impairment and unable to take care of themselves.  TOC consulted  Goals of care -Palliative care consulted -Patient is DNR -Outpatient palliative care follow-up   Scheduled medications:    aspirin EC  81 mg Oral Daily   atorvastatin  40 mg Oral q1800   carvedilol  3.125 mg Oral BID WC   docusate sodium  100 mg Oral BID   enoxaparin (LOVENOX) injection  40 mg Subcutaneous Q24H   feeding supplement  237 mL Oral BID BM   furosemide  40 mg Intravenous BID   multivitamin with minerals  1 tablet Oral Daily   polyethylene glycol  17 g Oral Daily   sodium chloride flush  3 mL Intravenous Q12H  sodium chloride flush  3 mL Intravenous Q12H         Data Reviewed:   CBG:  No results for input(s): GLUCAP in the last 168 hours.  SpO2: 94 %    Vitals:   08/07/20 1147 08/07/20 1941  08/08/20 0404 08/08/20 1133  BP: 138/68 134/73 122/62 117/62  Pulse: 72 83 74 61  Resp: 17 18 18 16   Temp: 98 F (36.7 C) 98.4 F (36.9 C) 98.3 F (36.8 C) 98 F (36.7 C)  TempSrc: Oral Oral Oral Oral  SpO2: 95% 99% 97% 94%  Weight:   60.4 kg   Height:         Intake/Output Summary (Last 24 hours) at 08/08/2020 1242 Last data filed at 08/07/2020 1302 Gross per 24 hour  Intake 237 ml  Output --  Net 237 ml    06/12 1901 - 06/14 0700 In: 337 [P.O.:337] Out: 125 [Urine:125]  Filed Weights   08/06/20 0249 08/07/20 0500 08/08/20 0404  Weight: 63.4 kg 62.4 kg 60.4 kg    CBC:  Recent Labs  Lab 08/03/20 1023 08/04/20 0315  WBC 7.3 6.6  HGB 10.6* 10.0*  HCT 30.1* 27.8*  PLT 163 154  MCV 76.2* 76.0*  MCH 26.8 27.3  MCHC 35.2 36.0  RDW 17.7* 17.6*    Complete metabolic panel:  Recent Labs  Lab 08/03/20 1023 08/04/20 0315 08/05/20 0930 08/06/20 0940 08/07/20 0910 08/08/20 0215  NA 135 135 134* 137 137 134*  K 4.0 3.7 5.3* 3.9 4.1 3.7  CL 102 104 103 101 102 98  CO2 26 26 28 31 30 30   GLUCOSE 113* 97 147* 99 86 94  BUN 32* 29* 29* 30* 35* 37*  CREATININE 1.51* 1.23* 1.03* 0.99 1.08* 1.05*  CALCIUM 7.4* 7.4* 7.7* 8.1* 8.3* 7.8*  AST 66*  --   --   --   --  53*  ALT 36  --   --   --   --  32  ALKPHOS 64  --   --   --   --  62  BILITOT 0.7  --   --   --   --  0.8  ALBUMIN 2.0*  --   --   --   --  2.1*  BNP 315.0*  --   --   --   --   --     No results for input(s): LIPASE, AMYLASE in the last 168 hours.  Recent Labs  Lab 08/03/20 1023  BNP 315.0*  SARSCOV2NAA NEGATIVE    ------------------------------------------------------------------------------------------------------------------ No results for input(s): CHOL, HDL, LDLCALC, TRIG, CHOLHDL, LDLDIRECT in the last 72 hours.  Lab Results  Component Value Date   HGBA1C 5.4 07/28/2017    ------------------------------------------------------------------------------------------------------------------ No results for input(s): TSH, T4TOTAL, T3FREE, THYROIDAB in the last 72 hours.  Invalid input(s): FREET3 ------------------------------------------------------------------------------------------------------------------ Recent Labs    08/08/20 0215  FERRITIN 76  TIBC 202*  IRON 50  RETICCTPCT 1.0    Coagulation profile No results for input(s): INR, PROTIME in the last 168 hours. No results for input(s): DDIMER in the last 72 hours.  Cardiac Enzymes No results for input(s): CKTOTAL, CKMB, CKMBINDEX, TROPONINI in the last 168 hours.  ------------------------------------------------------------------------------------------------------------------    Component Value Date/Time   BNP 315.0 (H) 08/03/2020 1023     Antibiotics: Anti-infectives (From admission, onward)    None        Radiology Reports  No results found.    DVT prophylaxis: Lovenox  Code Status: Full  code  Family Communication: Discussed with patient's husband at bedside   Consultants: Cardiology   Objective    Physical Examination:  General-appears in no acute distress Heart-S1-S2, regular, no murmur auscultated Lungs-bibasilar crackles Abdomen-soft, nontender, no organomegaly Extremities-bilateral 1+ pitting edema in the lower extremities Neuro-alert, oriented x3, no focal deficit noted  Status is: Inpatient  Dispo: The patient is from: Home              Anticipated d/c is to: Home              Anticipated d/c date is: 08/09/2020              Patient currently not stable for discharge  Barrier to discharge-ongoing evaluation for lower extremity edema/anasarca  COVID-19 Labs  Recent Labs    08/08/20 0215  FERRITIN 76    Lab Results  Component Value Date   SARSCOV2NAA NEGATIVE 08/03/2020   SARSCOV2NAA NEGATIVE 06/21/2020    Microbiology  Recent Results  (from the past 240 hour(s))  Resp Panel by RT-PCR (Flu A&B, Covid) Nasopharyngeal Swab     Status: None   Collection Time: 08/03/20 10:23 AM   Specimen: Nasopharyngeal Swab; Nasopharyngeal(NP) swabs in vial transport medium  Result Value Ref Range Status   SARS Coronavirus 2 by RT PCR NEGATIVE NEGATIVE Final    Comment: (NOTE) SARS-CoV-2 target nucleic acids are NOT DETECTED.  The SARS-CoV-2 RNA is generally detectable in upper respiratory specimens during the acute phase of infection. The lowest concentration of SARS-CoV-2 viral copies this assay can detect is 138 copies/mL. A negative result does not preclude SARS-Cov-2 infection and should not be used as the sole basis for treatment or other patient management decisions. A negative result may occur with  improper specimen collection/handling, submission of specimen other than nasopharyngeal swab, presence of viral mutation(s) within the areas targeted by this assay, and inadequate number of viral copies(<138 copies/mL). A negative result must be combined with clinical observations, patient history, and epidemiological information. The expected result is Negative.  Fact Sheet for Patients:  BloggerCourse.com  Fact Sheet for Healthcare Providers:  SeriousBroker.it  This test is no t yet approved or cleared by the [place] FDA and  has been authorized for detection and/or diagnosis of SARS-CoV-2 by FDA under an Emergency Use Authorization (EUA). This EUA will remain  in effect (meaning this test can be used) for the duration of the COVID-19 declaration under Section 564(b)(1) of the Act, 21 U.S.C.section 360bbb-3(b)(1), unless the authorization is terminated  or revoked sooner.       Influenza A by PCR NEGATIVE NEGATIVE Final   Influenza B by PCR NEGATIVE NEGATIVE Final    Comment: (NOTE) The Xpert Xpress SARS-CoV-2/FLU/RSV plus assay is intended as an aid in the  diagnosis of influenza from Nasopharyngeal swab specimens and should not be used as a sole basis for treatment. Nasal washings and aspirates are unacceptable for Xpert Xpress SARS-CoV-2/FLU/RSV testing.  Fact Sheet for Patients: BloggerCourse.com  Fact Sheet for Healthcare Providers: SeriousBroker.it  This test is not yet approved or cleared by the [place] FDA and has been authorized for detection and/or diagnosis of SARS-CoV-2 by FDA under an Emergency Use Authorization (EUA). This EUA will remain in effect (meaning this test can be used) for the duration of the COVID-19 declaration under Section 564(b)(1) of the Act, 21 U.S.C. section 360bbb-3(b)(1), unless the authorization is terminated or revoked.  Performed at [facility], [address] [place], [place] [address]  Meredeth Ide   [facility] If 7PM-7AM, please contact night-coverage at [email or web address], Office  [phone]   08/08/2020, 12:42 PM  LOS: 5 days

## 2020-08-09 DIAGNOSIS — I5033 Acute on chronic diastolic (congestive) heart failure: Secondary | ICD-10-CM | POA: Diagnosis not present

## 2020-08-09 LAB — CBC
HCT: 26.8 % — ABNORMAL LOW (ref 36.0–46.0)
Hemoglobin: 9.3 g/dL — ABNORMAL LOW (ref 12.0–15.0)
MCH: 26.8 pg (ref 26.0–34.0)
MCHC: 34.7 g/dL (ref 30.0–36.0)
MCV: 77.2 fL — ABNORMAL LOW (ref 80.0–100.0)
Platelets: 161 10*3/uL (ref 150–400)
RBC: 3.47 MIL/uL — ABNORMAL LOW (ref 3.87–5.11)
RDW: 17.4 % — ABNORMAL HIGH (ref 11.5–15.5)
WBC: 7.6 10*3/uL (ref 4.0–10.5)
nRBC: 0 % (ref 0.0–0.2)

## 2020-08-09 LAB — COMPREHENSIVE METABOLIC PANEL
ALT: 29 U/L (ref 0–44)
AST: 50 U/L — ABNORMAL HIGH (ref 15–41)
Albumin: 2.1 g/dL — ABNORMAL LOW (ref 3.5–5.0)
Alkaline Phosphatase: 62 U/L (ref 38–126)
Anion gap: 6 (ref 5–15)
BUN: 37 mg/dL — ABNORMAL HIGH (ref 8–23)
CO2: 31 mmol/L (ref 22–32)
Calcium: 7.9 mg/dL — ABNORMAL LOW (ref 8.9–10.3)
Chloride: 98 mmol/L (ref 98–111)
Creatinine, Ser: 1.08 mg/dL — ABNORMAL HIGH (ref 0.44–1.00)
GFR, Estimated: 50 mL/min — ABNORMAL LOW (ref >60)
Glucose, Bld: 104 mg/dL — ABNORMAL HIGH (ref 70–99)
Potassium: 3.6 mmol/L (ref 3.5–5.1)
Sodium: 135 mmol/L (ref 135–145)
Total Bilirubin: 0.4 mg/dL (ref 0.3–1.2)
Total Protein: 6.5 g/dL (ref 6.5–8.1)

## 2020-08-09 NOTE — Progress Notes (Signed)
Patient walked the hall for 160 ft with distress with Rolator walker.

## 2020-08-09 NOTE — Progress Notes (Signed)
Patient has removed tele monitor and is refusing to wear it. Attempts made to redirect patient are unsuccessful. She says she's ready to go home.

## 2020-08-09 NOTE — Progress Notes (Signed)
Progress Note  Patient Name: Erin Patterson Date of Encounter: 08/09/2020  [facility] Cardiologist: Jodelle Red, MD   Subjective   husband present in the room. Interview with assistance of interpreter [employer] [phone]. Breathing is better. Noticing improvement in the swelling in her legs. Tolerating medications well. No pain.   Inpatient Medications    Scheduled Meds:  aspirin EC  81 mg Oral Daily   atorvastatin  40 mg Oral q1800   carvedilol  3.125 mg Oral BID WC   docusate sodium  100 mg Oral BID   enoxaparin (LOVENOX) injection  40 mg Subcutaneous Q24H   feeding supplement  237 mL Oral BID BM   furosemide  40 mg Intravenous BID   multivitamin with minerals  1 tablet Oral Daily   polyethylene glycol  17 g Oral Daily   sodium chloride flush  3 mL Intravenous Q12H   sodium chloride flush  3 mL Intravenous Q12H   Continuous Infusions:  sodium chloride     sodium chloride     PRN Meds: sodium chloride, acetaminophen **OR** acetaminophen, bisacodyl, hydrALAZINE, morphine injection, ondansetron **OR** ondansetron (ZOFRAN) IV, oxyCODONE, sodium chloride flush, traZODone   Vital Signs    Vitals:   08/08/20 0404 08/08/20 1133 08/08/20 1929 08/09/20 0438  BP: 122/62 117/62 (!) 101/52 99/66  Pulse: 74 61 74 75  Resp: 18 16 18 18   Temp: 98.3 F (36.8 C) 98 F (36.7 C) 98 F (36.7 C) 97.7 F (36.5 C)  TempSrc: Oral Oral Oral Oral  SpO2: 97% 94% 99% 99%  Weight: 60.4 kg   60.5 kg  Height:        Intake/Output Summary (Last 24 hours) at 08/09/2020 0932 Last data filed at 08/09/2020 08/11/2020 Gross per 24 hour  Intake 743 ml  Output --  Net 743 ml   Last 3 Weights 08/09/2020 08/08/2020 08/07/2020  Weight (lbs) 133 lb 6.4 oz 133 lb 1.6 oz 137 lb 9.6 oz  Weight (kg) 60.51 kg 60.374 kg 62.415 kg      Telemetry    SR with rare PVCs- Personally Reviewed  ECG    No new since 08/03/20 - Personally Reviewed  Physical Exam  GEN: Well nourished, well developed in no  acute distress NECK: No JVD sitting upright CARDIAC: regular rhythm, normal S1 and S2, no rubs or gallops. Soft systolic murmur. VASCULAR: Radial pulses 2+ bilaterally.  RESPIRATORY:  Clear to auscultation without rales, wheezing or rhonchi  ABDOMEN: Soft, non-tender, non-distended MUSCULOSKELETAL:  Moves all 4 limbs independently SKIN: Warm and dry, 3+ bilateral LE edema to knees, 1-2+ above knees, improved from yesterday NEUROLOGIC:  No focal neuro deficits noted. PSYCHIATRIC:  Normal affect    Labs    High Sensitivity Troponin:   Recent Labs  Lab 08/03/20 1023 08/03/20 1216  TROPONINIHS 20* 15      Chemistry Recent Labs  Lab 08/03/20 1023 08/04/20 0315 08/07/20 0910 08/08/20 0215 08/09/20 0223  NA 135   < > 137 134* 135  K 4.0   < > 4.1 3.7 3.6  CL 102   < > 102 98 98  CO2 26   < > 30 30 31   GLUCOSE 113*   < > 86 94 104*  BUN 32*   < > 35* 37* 37*  CREATININE 1.51*   < > 1.08* 1.05* 1.08*  CALCIUM 7.4*   < > 8.3* 7.8* 7.9*  PROT 6.3*  --   --  6.4* 6.5  ALBUMIN 2.0*  --   --  2.1* 2.1*  AST 66*  --   --  53* 50*  ALT 36  --   --  32 29  ALKPHOS 64  --   --  62 62  BILITOT 0.7  --   --  0.8 0.4  GFRNONAA 33*   < > 50* 52* 50*  ANIONGAP 7   < > 5 6 6    < > = values in this interval not displayed.     Hematology Recent Labs  Lab 08/03/20 1023 08/04/20 0315 08/08/20 0215 08/09/20 0223  WBC 7.3 6.6  --  7.6  RBC 3.95 3.66* 3.69* 3.47*  HGB 10.6* 10.0*  --  9.3*  HCT 30.1* 27.8*  --  26.8*  MCV 76.2* 76.0*  --  77.2*  MCH 26.8 27.3  --  26.8  MCHC 35.2 36.0  --  34.7  RDW 17.7* 17.6*  --  17.4*  PLT 163 154  --  161    BNP Recent Labs  Lab 08/03/20 1023  BNP 315.0*     DDimer No results for input(s): DDIMER in the last 168 hours.   Radiology    No results found.  Cardiac Studies   Echo 06/22/20   1. Left ventricular ejection fraction, by estimation, is 60 to 65%. The  left ventricle has normal function. The left ventricle has no regional   wall motion abnormalities. There is moderate concentric left ventricular  hypertrophy. Left ventricular  diastolic function could not be evaluated.   2. Right ventricular systolic function is normal. The right ventricular  size is normal.   3. The mitral valve is degenerative. Mild mitral valve regurgitation. No  evidence of mitral stenosis. Moderate to severe mitral annular  calcification.   4. The aortic valve is tricuspid. There is mild calcification of the  aortic valve. Aortic valve regurgitation is mild. No aortic stenosis is  present.   Patient Profile     87 y.o. female with PMH of chronic diastolic heart failure, hypertension, hyperlipidemia, history of ischemic CVA 07/2017, and chronic anemia who presented with worsening fatigue and edema found to have acute on chronic diastolic HF exacerbation for which Cardiology has been consulted.   Assessment & Plan    Acute on chronic diastolic heart failure Acute kidney injury, resolved -I/O inaccurate. Weight from 65.5 kg to 60.5 kg today -Cr 1.08, similar to her baseline -declined RHC this admission -continue diuresis with IV lasix 40 mg BID. Her renal function has been stable at this dose, and her edema is improving.  Anasarca, hypoalbuminemia -workup per primary team -seen by heme/onc, no inpatient plans for further evaluation of elevated CA125 and unlikely to be a candidate for treatment. 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  Hypertension -stable, continue current medications  History of CVA Hyperlipidemia -continue aspirin 81 mg daily and atorvastatin 40 mg daily  Thoracic aortic aneurysm -4.1 cm -no acute intervention required -she has stated she does not want any procedures or surgeries. Given this, reasonable to not monitor as she would not want intervention  08/2017, MD, PhD, [facility]  [facility]    For questions or updates, please contact [facility] Please consult [email or web address] for contact  info under        Signed, [facility], MD  08/09/2020, 9:32 AM

## 2020-08-09 NOTE — Progress Notes (Signed)
Heart Failure Stewardship Pharmacist Progress Note   PCP: Jackie Plum, MD PCP-Cardiologist: Jodelle Red, MD    HPI:  87 yo F with PMH of HTN, HLD, prior stroke, MI, and HFpEF. She presented to the ED for acute on chronic diastolic heart failure. Previously admitted from 4/27-5/3 and back in the ED on 5/30 for similar presentations. CXR showed vascular congestion and small pleural effusions. Last ECHO done on 4/28 with LVEF of 60-65% and normal RV. After she presented to the ED on 5/30, multiple attempts were made to contact patient to organize her coming to HF [facility] clinic. However, patient was unable to be reached. Hospice has been consulted.  Current HF Medications: Furosemide 40 mg IV BID Carvedilol 3.125 mg BID  Prior to admission HF Medications: Furosemide 20 mg BID Carvedilol 3.125 mg BID Lisinopril 20 mg daily *Not taking Jardiance 10 mg daily (somehow was never dispensed by [facility] pharmacy)  Pertinent Lab Values: Serum creatinine 1.08, BUN 37, Potassium 3.6, Sodium 135, BNP 315  Vital Signs: Weight: 133 lbs (admission weight: 146 lbs) Blood pressure: 100/60s  Heart rate: 70s   Medication Assistance / Insurance Benefits Check: Does the patient have prescription insurance?  Yes Type of insurance plan: [place] Medicaid  Outpatient Pharmacy:  Prior to admission outpatient pharmacy: [facility] Is the patient willing to use [facility] TOC pharmacy at discharge? Yes Is the patient willing to transition their outpatient pharmacy to utilize a [facility] outpatient pharmacy?   Pending    Assessment: 1. Acute on chronic diastolic CHF (EF 67-12%). NYHA class III symptoms. Declined RHC this admission. Hospice has been consulted.  - Continue furosemide 40 mg IV BID - Continue carvedilol 3.125 mg BID   Plan: 1) Medication changes recommended at this time: - None, hospice has been consulted. HF Navigation team will sign off.  2) Patient assistance: - Has Medicaid - prescription  copays should be $0-3 per month - HF TOC appt cancelled given palliative care/GOC discussions   Sharen Hones, PharmD, BCPS Heart Failure Stewardship Pharmacist Phone [phone]

## 2020-08-09 NOTE — Progress Notes (Signed)
Pt daughter and husband at beside and denies needing home health however would like a Rolator walker to sit when tired of walking. Barriers: communication Roderic Palau, Conley Rolls can interpret for family [phone]

## 2020-08-09 NOTE — Progress Notes (Signed)
PROGRESS NOTE    Erin Patterson  WJX:[id number] DOB: 05/04/1932 DOA: 08/03/2020 PCP: Jackie Plum, MD     Brief Narrative:  Erin Patterson is an 87 year old female with past medical history significant for chronic diastolic heart failure, hypertension, hyperlipidemia, CAD and dementia who presented to the hospital with complaints of fatigue, worsening lower extremity edema.  Patient had hypoalbuminemia for the past 2 months, had worsening swelling including her upper extremities and lower extremities.  In the emergency department, patient was found to have lower extremity edema/anasarca, thought to be secondary to CHF exacerbation.  She was started on IV Lasix, cardiology consulted.  Patient was supposed to undergo right heart catheterization, however patient declined work-up.  New events last 24 hours / Subjective: Patient seen with family at bedside, using iPad interpreter.  She admits to generalized tiredness, still has some lower extremity swelling but improving.  Denies any chest pain or shortness of breath.  Assessment & Plan:   Principal Problem:   Acute on chronic diastolic CHF (congestive heart failure) (HCC) Active Problems:   HLD (hyperlipidemia)   Benign essential HTN   AKI (acute kidney injury) (HCC)   Anasarca   Stage 3a chronic kidney disease (HCC)   Adnexal fullness    Anasarca/lower extremity edema -Unclear etiology -Serum albumin is 2.0; UA obtained on 06/25/2020 was unremarkable.  Repeat UA is unremarkable. -CT abdomen pelvis chest obtained on 06/26/2020 was unremarkable -Echocardiogram on 06/22/2020 showed EF of 60 to 65%; normal right ventricular function and size -Cardiology recommended right heart cath however patient declined -Concern for right heart failure, diuresed very well with IV Lasix    Elevated CA125 -CA125 minimally elevated at 88; pelvic ultrasound obtained shows only simple cyst, ovaries not visualized bilaterally -Appreciate Dr. Myna Hidalgo  following, patient may have hemoglobinopathy   Acute kidney injury -Baseline creatinine was around 1 in May 2022 -Improved    Acute on chronic diastolic CHF -Cardiology following -Continues to diurese well, continue IV Lasix    Hypertension -Continue Coreg. Lisinopril on hold due to AKI, could resume as soon   History of CVA -Continue aspirin, atorvastatin   Ascending thoracic aortic enlargement -CT abdomen/pelvis showed 4.1 cm ascending thoracic aortic caliber -Recommended annual follow-up by CT or MRI, but patient had stated that she would not want any procedures or surgeries regardless   Goals of care -Palliative care consulted -Patient is DNR -Outpatient palliative care follow-up     DVT prophylaxis:  enoxaparin (LOVENOX) injection 40 mg Start: 08/07/20 2200  Code Status:     Code Status Orders  (From admission, onward)           Start     Ordered   08/05/20 0839  Do not attempt resuscitation (DNR)  Continuous       Question Answer Comment  In the event of cardiac or respiratory ARREST Do not call a "code blue"   In the event of cardiac or respiratory ARREST Do not perform Intubation, CPR, defibrillation or ACLS   In the event of cardiac or respiratory ARREST Use medication by any route, position, wound care, and other measures to relive pain and suffering. May use oxygen, suction and manual treatment of airway obstruction as needed for comfort.      08/05/20 0838           Code Status History     Date Active Date Inactive Code Status Order ID Comments User Context   08/03/2020 1516 08/05/2020 0838 Full Code [id number]  Ophelia Charter,  Victorino Dike, MD Inpatient   06/21/2020 2125 06/27/2020 1805 Full Code [id number]  Hillary Bow, DO ED   07/30/2017 1812 08/08/2017 1558 Full Code [id number]  Jacquelynn Cree, PA-C Inpatient   07/30/2017 1812 07/30/2017 1812 Full Code [id number]  Jerene Pitch Inpatient   07/26/2017 1515 07/30/2017 1739 Full Code [id number]  Lovena Neighbours,  MD ED   12/10/2014 1919 12/13/2014 1422 Full Code [id number]  Clydie Braun, MD Inpatient      Family Communication: At bedside Disposition Plan:  Status is: Inpatient  Remains inpatient appropriate because:IV treatments appropriate due to intensity of illness or inability to take PO  Dispo: The patient is from: Home              Anticipated d/c is to: Home              Patient currently is not medically stable to d/c.   Difficult to place patient No      Consultants:  Cardiology Palliative care medicine Oncology  Procedures:  None   Antimicrobials:  Anti-infectives (From admission, onward)    None        Objective: Vitals:   08/08/20 1133 08/08/20 1929 08/09/20 0438 08/09/20 1152  BP: 117/62 (!) 101/52 99/66 110/68  Pulse: 61 74 75 67  Resp: 16 18 18 16   Temp: 98 F (36.7 C) 98 F (36.7 C) 97.7 F (36.5 C) 98.4 F (36.9 C)  TempSrc: Oral Oral Oral Oral  SpO2: 94% 99% 99%   Weight:   60.5 kg   Height:        Intake/Output Summary (Last 24 hours) at 08/09/2020 1214 Last data filed at 08/09/2020 0807 Gross per 24 hour  Intake 743 ml  Output --  Net 743 ml   Filed Weights   08/07/20 0500 08/08/20 0404 08/09/20 0438  Weight: 62.4 kg 60.4 kg 60.5 kg    Examination:  General exam: Appears calm and comfortable  Respiratory system: Clear to auscultation. Respiratory effort normal. No respiratory distress. No conversational dyspnea.  Cardiovascular system: S1 & S2 heard, RRR. No murmurs. +bilateral pedal edema. Gastrointestinal system: Abdomen is nondistended, soft and nontender. Normal bowel sounds heard. Central nervous system: Alert and oriented. No focal neurological deficits. Speech clear.  Extremities: Symmetric in appearance  Skin: No rashes, lesions or ulcers on exposed skin  Psychiatry: Mood & affect appropriate.   Data Reviewed: I have personally reviewed following labs and imaging studies  CBC: Recent Labs  Lab 08/03/20 1023  08/04/20 0315 08/09/20 0223  WBC 7.3 6.6 7.6  HGB 10.6* 10.0* 9.3*  HCT 30.1* 27.8* 26.8*  MCV 76.2* 76.0* 77.2*  PLT 163 154 161   Basic Metabolic Panel: Recent Labs  Lab 08/05/20 0930 08/06/20 0940 08/07/20 0910 08/08/20 0215 08/09/20 0223  NA 134* 137 137 134* 135  K 5.3* 3.9 4.1 3.7 3.6  CL 103 101 102 98 98  CO2 28 31 30 30 31   GLUCOSE 147* 99 86 94 104*  BUN 29* 30* 35* 37* 37*  CREATININE 1.03* 0.99 1.08* 1.05* 1.08*  CALCIUM 7.7* 8.1* 8.3* 7.8* 7.9*   GFR: Estimated Creatinine Clearance: 31.2 mL/min (A) (by C-G formula based on SCr of 1.08 mg/dL (H)). Liver Function Tests: Recent Labs  Lab 08/03/20 1023 08/08/20 0215 08/09/20 0223  AST 66* 53* 50*  ALT 36 32 29  ALKPHOS 64 62 62  BILITOT 0.7 0.8 0.4  PROT 6.3* 6.4* 6.5  ALBUMIN 2.0* 2.1* 2.1*   No results  for input(s): LIPASE, AMYLASE in the last 168 hours. No results for input(s): AMMONIA in the last 168 hours. Coagulation Profile: No results for input(s): INR, PROTIME in the last 168 hours. Cardiac Enzymes: No results for input(s): CKTOTAL, CKMB, CKMBINDEX, TROPONINI in the last 168 hours. BNP (last 3 results) No results for input(s): PROBNP in the last 8760 hours. HbA1C: No results for input(s): HGBA1C in the last 72 hours. CBG: No results for input(s): GLUCAP in the last 168 hours. Lipid Profile: No results for input(s): CHOL, HDL, LDLCALC, TRIG, CHOLHDL, LDLDIRECT in the last 72 hours. Thyroid Function Tests: No results for input(s): TSH, T4TOTAL, FREET4, T3FREE, THYROIDAB in the last 72 hours. Anemia Panel: Recent Labs    08/08/20 0215  FERRITIN 76  TIBC 202*  IRON 50  RETICCTPCT 1.0   Sepsis Labs: No results for input(s): PROCALCITON, LATICACIDVEN in the last 168 hours.  Recent Results (from the past 240 hour(s))  Resp Panel by RT-PCR (Flu A&B, Covid) Nasopharyngeal Swab     Status: None   Collection Time: 08/03/20 10:23 AM   Specimen: Nasopharyngeal Swab; Nasopharyngeal(NP) swabs  in vial transport medium  Result Value Ref Range Status   SARS Coronavirus 2 by RT PCR NEGATIVE NEGATIVE Final    Comment: (NOTE) SARS-CoV-2 target nucleic acids are NOT DETECTED.  The SARS-CoV-2 RNA is generally detectable in upper respiratory specimens during the acute phase of infection. The lowest concentration of SARS-CoV-2 viral copies this assay can detect is 138 copies/mL. A negative result does not preclude SARS-Cov-2 infection and should not be used as the sole basis for treatment or other patient management decisions. A negative result may occur with  improper specimen collection/handling, submission of specimen other than nasopharyngeal swab, presence of viral mutation(s) within the areas targeted by this assay, and inadequate number of viral copies(<138 copies/mL). A negative result must be combined with clinical observations, patient history, and epidemiological information. The expected result is Negative.  Fact Sheet for Patients:  BloggerCourse.com  Fact Sheet for Healthcare Providers:  SeriousBroker.it  This test is no t yet approved or cleared by the [place] FDA and  has been authorized for detection and/or diagnosis of SARS-CoV-2 by FDA under an Emergency Use Authorization (EUA). This EUA will remain  in effect (meaning this test can be used) for the duration of the COVID-19 declaration under Section 564(b)(1) of the Act, 21 U.S.C.section 360bbb-3(b)(1), unless the authorization is terminated  or revoked sooner.       Influenza A by PCR NEGATIVE NEGATIVE Final   Influenza B by PCR NEGATIVE NEGATIVE Final    Comment: (NOTE) The Xpert Xpress SARS-CoV-2/FLU/RSV plus assay is intended as an aid in the diagnosis of influenza from Nasopharyngeal swab specimens and should not be used as a sole basis for treatment. Nasal washings and aspirates are unacceptable for Xpert Xpress  SARS-CoV-2/FLU/RSV testing.  Fact Sheet for Patients: BloggerCourse.com  Fact Sheet for Healthcare Providers: SeriousBroker.it  This test is not yet approved or cleared by the [place] FDA and has been authorized for detection and/or diagnosis of SARS-CoV-2 by FDA under an Emergency Use Authorization (EUA). This EUA will remain in effect (meaning this test can be used) for the duration of the COVID-19 declaration under Section 564(b)(1) of the Act, 21 U.S.C. section 360bbb-3(b)(1), unless the authorization is terminated or revoked.  Performed at [facility], [address], [place], [place] [address]       Radiology Studies: No results found.  Scheduled Meds:  aspirin EC  81 mg Oral Daily   atorvastatin  40 mg Oral q1800   carvedilol  3.125 mg Oral BID WC   docusate sodium  100 mg Oral BID   enoxaparin (LOVENOX) injection  40 mg Subcutaneous Q24H   feeding supplement  237 mL Oral BID BM   furosemide  40 mg Intravenous BID   multivitamin with minerals  1 tablet Oral Daily   polyethylene glycol  17 g Oral Daily   sodium chloride flush  3 mL Intravenous Q12H   sodium chloride flush  3 mL Intravenous Q12H   Continuous Infusions:  sodium chloride     sodium chloride       LOS: 6 days      Time spent: 35 minutes   Noralee Stain, DO [facility] 08/09/2020, 12:14 PM   Available via Epic secure chat 7am-7pm After these hours, please refer to coverage provider listed on [email or web address]

## 2020-08-10 DIAGNOSIS — I5033 Acute on chronic diastolic (congestive) heart failure: Secondary | ICD-10-CM | POA: Diagnosis not present

## 2020-08-10 LAB — BASIC METABOLIC PANEL
Anion gap: 6 (ref 5–15)
BUN: 42 mg/dL — ABNORMAL HIGH (ref 8–23)
CO2: 32 mmol/L (ref 22–32)
Calcium: 7.8 mg/dL — ABNORMAL LOW (ref 8.9–10.3)
Chloride: 98 mmol/L (ref 98–111)
Creatinine, Ser: 1.3 mg/dL — ABNORMAL HIGH (ref 0.44–1.00)
GFR, Estimated: 40 mL/min — ABNORMAL LOW (ref >60)
Glucose, Bld: 94 mg/dL (ref 70–99)
Potassium: 3.7 mmol/L (ref 3.5–5.1)
Sodium: 136 mmol/L (ref 135–145)

## 2020-08-10 MED ORDER — ENOXAPARIN SODIUM 30 MG/0.3ML IJ SOSY
30.0000 mg | PREFILLED_SYRINGE | INTRAMUSCULAR | Status: DC
  Administered 2020-08-10: 30 mg via SUBCUTANEOUS
  Filled 2020-08-10: qty 0.3

## 2020-08-10 NOTE — Progress Notes (Signed)
Physical Therapy Treatment Patient Details Name: Erin Patterson MRN: [id number] DOB: 07-Jun-1932 Today's Date: 08/10/2020    History of Present Illness 87 y.o. female presents to [facility] ED on 08/03/2020 with reports of dyspnea, weakness, and fatigue. Pt recently discharged from [facility] ED 2 days prior with similar symptoms. PMH includes anasarca, CHF, CVA, HTN, MI.    PT Comments    Pt does not require physical assistance to perform bed mobility, transfers, and ambulate at this time. Pt tolerates gait of household distances with use of rollator, and continues to require multimodal cues for brake management as she consistently is not locking brakes when attempting to sit on device. Pt demonstrates deficits in safety awareness and activity tolerance and will benefit from continued acute PT services to improve safety with device management and endurace.    Follow Up Recommendations  No PT follow up     Equipment Recommendations  Rolling walker with 5" wheels (Pt reports she has already been given rollator this admission. Safety with rollator questionable as pt consistently forgets to lock brakes when sitting.)    Recommendations for Other Services       Precautions / Restrictions Precautions Precautions: Fall Restrictions Weight Bearing Restrictions: No    Mobility  Bed Mobility Overal bed mobility: Modified Independent             General bed mobility comments: HOB elevated    Transfers Overall transfer level: Independent Equipment used: None Transfers: Sit to/from Stand Sit to Stand: Independent            Ambulation/Gait Ambulation/Gait assistance: Supervision;Modified independent (Device/Increase time) Gait Distance (Feet): 60 Feet (x 2 bouts, 1 seated rest break) Assistive device: 4-wheeled walker Gait Pattern/deviations: Step-through pattern;Decreased stride length Gait velocity: reduced Gait velocity interpretation: 1.31 - 2.62 ft/sec, indicative of limited community  ambulator General Gait Details: Slow, steady gait with rollator for household distances   Stairs             Wheelchair Mobility    Modified Rankin (Stroke Patients Only)       Balance Overall balance assessment: Needs assistance Sitting-balance support: Feet supported Sitting balance-Leahy Scale: Good     Standing balance support: No upper extremity supported;During functional activity;Bilateral upper extremity supported Standing balance-Leahy Scale: Fair Standing balance comment: Pt able to stand statically without reliance of UE support                            Cognition Arousal/Alertness: Awake/alert Behavior During Therapy: WFL for tasks assessed/performed Overall Cognitive Status: Difficult to assess                                 General Comments: [place] interpreter. Follows one step commands inconsistently.      Exercises      General Comments General comments (skin integrity, edema, etc.): VSS on RA. Pt reports R shoulder pain persists.      Pertinent Vitals/Pain Pain Assessment: Faces Faces Pain Scale: Hurts a little bit Pain Location: R shoulder Pain Descriptors / Indicators: Discomfort Pain Intervention(s): Monitored during session    Home Living                      Prior Function            PT Goals (current goals can now be found in the care plan section) Progress towards  PT goals: Progressing toward goals    Frequency    Min 3X/week      PT Plan Current plan remains appropriate    Co-evaluation              AM-PAC PT "6 Clicks" Mobility   Outcome Measure  Help needed turning from your back to your side while in a flat bed without using bedrails?: None Help needed moving from lying on your back to sitting on the side of a flat bed without using bedrails?: None Help needed moving to and from a bed to a chair (including a wheelchair)?: None Help needed standing up from a chair  using your arms (e.g., wheelchair or bedside chair)?: None Help needed to walk in hospital room?: None Help needed climbing 3-5 steps with a railing? : A Little 6 Click Score: 23    End of Session Equipment Utilized During Treatment: Gait belt Activity Tolerance: Patient limited by fatigue Patient left: in bed;with call bell/phone within reach;with family/visitor present (sitting at EOB) Nurse Communication: Mobility status PT Visit Diagnosis: Other abnormalities of gait and mobility (R26.89);Muscle weakness (generalized) (M62.81)     Time: 4431-5400 PT Time Calculation (min) (ACUTE ONLY): 22 min  Charges:  $Gait Training: 8-22 mins                     Acute Rehab  Pager: [phone]    Waldemar Dickens, SPT  08/10/2020, 3:45 PM

## 2020-08-10 NOTE — Progress Notes (Signed)
PROGRESS NOTE    Erin Patterson  JJK:[id number] DOB: 07-03-1932 DOA: 08/03/2020 PCP: Jackie Plum, MD     Brief Narrative:  Erin Patterson is an 87 year old female with past medical history significant for chronic diastolic heart failure, hypertension, hyperlipidemia, CAD and dementia who presented to the hospital with complaints of fatigue, worsening lower extremity edema.  Patient had hypoalbuminemia for the past 2 months, had worsening swelling including her upper extremities and lower extremities.  In the emergency department, patient was found to have lower extremity edema/anasarca, thought to be secondary to CHF exacerbation.  She was started on IV Lasix, cardiology consulted.  Patient was supposed to undergo right heart catheterization, however patient declined work-up.  New events last 24 hours / Subjective: Patient seen with family at bedside, using iPad interpreter.  She has no complaints this morning, feeling well overall.  States her lower extremity swelling is slowly improving.  Urinating well.  I/Os are inaccurately documented.  Assessment & Plan:   Principal Problem:   Acute on chronic diastolic CHF (congestive heart failure) (HCC) Active Problems:   HLD (hyperlipidemia)   Benign essential HTN   AKI (acute kidney injury) (HCC)   Anasarca   Stage 3a chronic kidney disease (HCC)   Adnexal fullness    Anasarca/lower extremity edema -Unclear etiology -Serum albumin is 2.0; UA obtained on 06/25/2020 was unremarkable.  Repeat UA is unremarkable. -CT abdomen pelvis chest obtained on 06/26/2020 was unremarkable -Echocardiogram on 06/22/2020 showed EF of 60 to 65%; normal right ventricular function and size -Cardiology recommended right heart cath however patient declined -Concern for right heart failure, diuresed very well with IV Lasix    Elevated CA125 -CA125 minimally elevated at 88; pelvic ultrasound obtained shows only simple cyst, ovaries not visualized  bilaterally -Appreciate Dr. Myna Hidalgo following, patient may have hemoglobinopathy   Acute kidney injury -Baseline creatinine was around 1 in May 2022 -Improved initially, slight bump this morning up to 1.30. Monitor     Acute on chronic diastolic CHF -Cardiology following -Continues to diurese well, continue IV Lasix    Hypertension -Continue Coreg. Lisinopril on hold due to AKI, could resume as soon   History of CVA -Continue aspirin, atorvastatin   Ascending thoracic aortic enlargement -CT abdomen/pelvis showed 4.1 cm ascending thoracic aortic caliber -Recommended annual follow-up by CT or MRI, but patient had stated that she would not want any procedures or surgeries regardless   Goals of care -Palliative care consulted -Patient is DNR -Outpatient palliative care follow-up     DVT prophylaxis:  enoxaparin (LOVENOX) injection 30 mg Start: 08/10/20 2200  Code Status:     Code Status Orders  (From admission, onward)           Start     Ordered   08/05/20 0839  Do not attempt resuscitation (DNR)  Continuous       Question Answer Comment  In the event of cardiac or respiratory ARREST Do not call a "code blue"   In the event of cardiac or respiratory ARREST Do not perform Intubation, CPR, defibrillation or ACLS   In the event of cardiac or respiratory ARREST Use medication by any route, position, wound care, and other measures to relive pain and suffering. May use oxygen, suction and manual treatment of airway obstruction as needed for comfort.      08/05/20 0838           Code Status History     Date Active Date Inactive Code Status Order ID  Comments User Context   08/03/2020 1516 08/05/2020 0838 Full Code [id number]  Jonah Blue, MD Inpatient   06/21/2020 2125 06/27/2020 1805 Full Code [id number]  Hillary Bow, DO ED   07/30/2017 1812 08/08/2017 1558 Full Code [id number]  Jacquelynn Cree, PA-C Inpatient   07/30/2017 1812 07/30/2017 1812 Full Code [id number]  Jerene Pitch Inpatient   07/26/2017 1515 07/30/2017 1739 Full Code [id number]  Lovena Neighbours, MD ED   12/10/2014 1919 12/13/2014 1422 Full Code [id number]  Clydie Braun, MD Inpatient      Family Communication: At bedside Disposition Plan:  Status is: Inpatient  Remains inpatient appropriate because:IV treatments appropriate due to intensity of illness or inability to take PO  Dispo: The patient is from: Home              Anticipated d/c is to: Home              Patient currently is not medically stable to d/c.   Difficult to place patient No      Consultants:  Cardiology Palliative care medicine Oncology  Procedures:  None   Antimicrobials:  Anti-infectives (From admission, onward)    None        Objective: Vitals:   08/09/20 1152 08/09/20 1921 08/10/20 0513 08/10/20 0934  BP: 110/68 (!) 111/56 121/62 115/61  Pulse: 67 79 73 62  Resp: 16 18 18    Temp: 98.4 F (36.9 C) 98.6 F (37 C) 98.3 F (36.8 C)   TempSrc: Oral Oral Oral   SpO2:  99% 96%   Weight:   59.9 kg   Height:        Intake/Output Summary (Last 24 hours) at 08/10/2020 1056 Last data filed at 08/10/2020 0829 Gross per 24 hour  Intake 720 ml  Output --  Net 720 ml    Filed Weights   08/08/20 0404 08/09/20 0438 08/10/20 0513  Weight: 60.4 kg 60.5 kg 59.9 kg   Examination: General exam: Appears calm and comfortable  Respiratory system: Clear to auscultation. Respiratory effort normal.  On room air Cardiovascular system: S1 & S2 heard, RRR. + pedal edema, TED hose in place Gastrointestinal system: Abdomen is nondistended, soft and nontender. Normal bowel sounds heard. Central nervous system: Alert and oriented. Non focal exam. Speech clear  Extremities: Symmetric in appearance bilaterally  Skin: No rashes, lesions or ulcers on exposed skin  Psychiatry: Judgement and insight appear stable. Mood & affect appropriate.    Data Reviewed: I have personally reviewed following labs and  imaging studies  CBC: Recent Labs  Lab 08/04/20 0315 08/09/20 0223  WBC 6.6 7.6  HGB 10.0* 9.3*  HCT 27.8* 26.8*  MCV 76.0* 77.2*  PLT 154 161    Basic Metabolic Panel: Recent Labs  Lab 08/06/20 0940 08/07/20 0910 08/08/20 0215 08/09/20 0223 08/10/20 0219  NA 137 137 134* 135 136  K 3.9 4.1 3.7 3.6 3.7  CL 101 102 98 98 98  CO2 31 30 30 31  32  GLUCOSE 99 86 94 104* 94  BUN 30* 35* 37* 37* 42*  CREATININE 0.99 1.08* 1.05* 1.08* 1.30*  CALCIUM 8.1* 8.3* 7.8* 7.9* 7.8*    GFR: Estimated Creatinine Clearance: 25.8 mL/min (A) (by C-G formula based on SCr of 1.3 mg/dL (H)). Liver Function Tests: Recent Labs  Lab 08/08/20 0215 08/09/20 0223  AST 53* 50*  ALT 32 29  ALKPHOS 62 62  BILITOT 0.8 0.4  PROT 6.4* 6.5  ALBUMIN 2.1* 2.1*  No results for input(s): LIPASE, AMYLASE in the last 168 hours. No results for input(s): AMMONIA in the last 168 hours. Coagulation Profile: No results for input(s): INR, PROTIME in the last 168 hours. Cardiac Enzymes: No results for input(s): CKTOTAL, CKMB, CKMBINDEX, TROPONINI in the last 168 hours. BNP (last 3 results) No results for input(s): PROBNP in the last 8760 hours. HbA1C: No results for input(s): HGBA1C in the last 72 hours. CBG: No results for input(s): GLUCAP in the last 168 hours. Lipid Profile: No results for input(s): CHOL, HDL, LDLCALC, TRIG, CHOLHDL, LDLDIRECT in the last 72 hours. Thyroid Function Tests: No results for input(s): TSH, T4TOTAL, FREET4, T3FREE, THYROIDAB in the last 72 hours. Anemia Panel: Recent Labs    08/08/20 0215  FERRITIN 76  TIBC 202*  IRON 50  RETICCTPCT 1.0    Sepsis Labs: No results for input(s): PROCALCITON, LATICACIDVEN in the last 168 hours.  Recent Results (from the past 240 hour(s))  Resp Panel by RT-PCR (Flu A&B, Covid) Nasopharyngeal Swab     Status: None   Collection Time: 08/03/20 10:23 AM   Specimen: Nasopharyngeal Swab; Nasopharyngeal(NP) swabs in vial transport  medium  Result Value Ref Range Status   SARS Coronavirus 2 by RT PCR NEGATIVE NEGATIVE Final    Comment: (NOTE) SARS-CoV-2 target nucleic acids are NOT DETECTED.  The SARS-CoV-2 RNA is generally detectable in upper respiratory specimens during the acute phase of infection. The lowest concentration of SARS-CoV-2 viral copies this assay can detect is 138 copies/mL. A negative result does not preclude SARS-Cov-2 infection and should not be used as the sole basis for treatment or other patient management decisions. A negative result may occur with  improper specimen collection/handling, submission of specimen other than nasopharyngeal swab, presence of viral mutation(s) within the areas targeted by this assay, and inadequate number of viral copies(<138 copies/mL). A negative result must be combined with clinical observations, patient history, and epidemiological information. The expected result is Negative.  Fact Sheet for Patients:  BloggerCourse.com  Fact Sheet for Healthcare Providers:  SeriousBroker.it  This test is no t yet approved or cleared by the [place] FDA and  has been authorized for detection and/or diagnosis of SARS-CoV-2 by FDA under an Emergency Use Authorization (EUA). This EUA will remain  in effect (meaning this test can be used) for the duration of the COVID-19 declaration under Section 564(b)(1) of the Act, 21 U.S.C.section 360bbb-3(b)(1), unless the authorization is terminated  or revoked sooner.       Influenza A by PCR NEGATIVE NEGATIVE Final   Influenza B by PCR NEGATIVE NEGATIVE Final    Comment: (NOTE) The Xpert Xpress SARS-CoV-2/FLU/RSV plus assay is intended as an aid in the diagnosis of influenza from Nasopharyngeal swab specimens and should not be used as a sole basis for treatment. Nasal washings and aspirates are unacceptable for Xpert Xpress SARS-CoV-2/FLU/RSV testing.  Fact Sheet for  Patients: BloggerCourse.com  Fact Sheet for Healthcare Providers: SeriousBroker.it  This test is not yet approved or cleared by the [place] FDA and has been authorized for detection and/or diagnosis of SARS-CoV-2 by FDA under an Emergency Use Authorization (EUA). This EUA will remain in effect (meaning this test can be used) for the duration of the COVID-19 declaration under Section 564(b)(1) of the Act, 21 U.S.C. section 360bbb-3(b)(1), unless the authorization is terminated or revoked.  Performed at [facility], [address] [place], [place] [address]        Radiology Studies: No results  found.    Scheduled Meds:  aspirin EC  81 mg Oral Daily   atorvastatin  40 mg Oral q1800   carvedilol  3.125 mg Oral BID WC   docusate sodium  100 mg Oral BID   enoxaparin (LOVENOX) injection  30 mg Subcutaneous Q24H   feeding supplement  237 mL Oral BID BM   furosemide  40 mg Intravenous BID   multivitamin with minerals  1 tablet Oral Daily   polyethylene glycol  17 g Oral Daily   sodium chloride flush  3 mL Intravenous Q12H   Continuous Infusions:     LOS: 7 days      Time spent: 20 minutes   Noralee Stain, DO [facility] 08/10/2020, 10:56 AM   Available via Epic secure chat 7am-7pm After these hours, please refer to coverage provider listed on [email or web address]

## 2020-08-10 NOTE — Progress Notes (Addendum)
Progress Note  Patient Name: Erin Patterson Date of Encounter: 08/10/2020  [facility] Cardiologist: Jodelle Red, MD   Subjective   husband present in the room. Interview with assistance of interpreter Thanak #[phone]. No concerns today. Feels that breathing and swelling are improving. Hoping she can go home soon.  Inpatient Medications    Scheduled Meds:  aspirin EC  81 mg Oral Daily   atorvastatin  40 mg Oral q1800   carvedilol  3.125 mg Oral BID WC   docusate sodium  100 mg Oral BID   enoxaparin (LOVENOX) injection  30 mg Subcutaneous Q24H   feeding supplement  237 mL Oral BID BM   multivitamin with minerals  1 tablet Oral Daily   polyethylene glycol  17 g Oral Daily   sodium chloride flush  3 mL Intravenous Q12H   Continuous Infusions:   PRN Meds: acetaminophen **OR** acetaminophen, bisacodyl, hydrALAZINE, morphine injection, ondansetron **OR** ondansetron (ZOFRAN) IV, oxyCODONE, traZODone   Vital Signs    Vitals:   08/09/20 1921 08/10/20 0513 08/10/20 0934 08/10/20 1106  BP: (!) 111/56 121/62 115/61 124/60  Pulse: 79 73 62 72  Resp: 18 18  18   Temp: 98.6 F (37 C) 98.3 F (36.8 C)  98.1 F (36.7 C)  TempSrc: Oral Oral  Oral  SpO2: 99% 96%  96%  Weight:  59.9 kg    Height:        Intake/Output Summary (Last 24 hours) at 08/10/2020 1305 Last data filed at 08/10/2020 08/12/2020 Gross per 24 hour  Intake 720 ml  Output --  Net 720 ml   Last 3 Weights 08/10/2020 08/09/2020 08/08/2020  Weight (lbs) 132 lb 133 lb 6.4 oz 133 lb 1.6 oz  Weight (kg) 59.875 kg 60.51 kg 60.374 kg      Telemetry    SR with rare PVCs- Personally Reviewed  ECG    No new since 08/03/20 - Personally Reviewed  Physical Exam  GEN: Well nourished, well developed in no acute distress NECK: No JVD CARDIAC: regular rhythm, normal S1 and S2, no rubs or gallops. Soft systolic murmur. VASCULAR: Radial pulses 2+ bilaterally.  RESPIRATORY:  Clear to auscultation without wheezing or  rhonchi. Bilateral rales at bases ABDOMEN: Soft, non-tender, non-distended MUSCULOSKELETAL:  Moves all 4 limbs independently SKIN: Warm and dry, bilateral LE edema 2+ and firm NEUROLOGIC:  No focal neuro deficits noted. PSYCHIATRIC:  Normal affect    Labs    High Sensitivity Troponin:   Recent Labs  Lab 08/03/20 1023 08/03/20 1216  TROPONINIHS 20* 15      Chemistry Recent Labs  Lab 08/08/20 0215 08/09/20 0223 08/10/20 0219  NA 134* 135 136  K 3.7 3.6 3.7  CL 98 98 98  CO2 30 31 32  GLUCOSE 94 104* 94  BUN 37* 37* 42*  CREATININE 1.05* 1.08* 1.30*  CALCIUM 7.8* 7.9* 7.8*  PROT 6.4* 6.5  --   ALBUMIN 2.1* 2.1*  --   AST 53* 50*  --   ALT 32 29  --   ALKPHOS 62 62  --   BILITOT 0.8 0.4  --   GFRNONAA 52* 50* 40*  ANIONGAP 6 6 6      Hematology Recent Labs  Lab 08/04/20 0315 08/08/20 0215 08/09/20 0223  WBC 6.6  --  7.6  RBC 3.66* 3.69* 3.47*  HGB 10.0*  --  9.3*  HCT 27.8*  --  26.8*  MCV 76.0*  --  77.2*  MCH 27.3  --  26.8  MCHC 36.0  --  34.7  RDW 17.6*  --  17.4*  PLT 154  --  161    BNP No results for input(s): BNP, PROBNP in the last 168 hours.    DDimer No results for input(s): DDIMER in the last 168 hours.   Radiology    No results found.  Cardiac Studies   Echo 06/22/20   1. Left ventricular ejection fraction, by estimation, is 60 to 65%. The  left ventricle has normal function. The left ventricle has no regional  wall motion abnormalities. There is moderate concentric left ventricular  hypertrophy. Left ventricular  diastolic function could not be evaluated.   2. Right ventricular systolic function is normal. The right ventricular  size is normal.   3. The mitral valve is degenerative. Mild mitral valve regurgitation. No  evidence of mitral stenosis. Moderate to severe mitral annular  calcification.   4. The aortic valve is tricuspid. There is mild calcification of the  aortic valve. Aortic valve regurgitation is mild. No aortic  stenosis is  present.   Patient Profile     87 y.o. female with PMH of chronic diastolic heart failure, hypertension, hyperlipidemia, history of ischemic CVA 07/2017, and chronic anemia who presented with worsening fatigue and edema found to have acute on chronic diastolic HF exacerbation for which Cardiology has been consulted.   Assessment & Plan    Acute on chronic diastolic heart failure Acute kidney injury, resolved Anasarca, hypoalbuminemia -I/O inaccurate. Weight from 65.5 kg to 59.9 kg today -Cr 1.30 today, slightly above her baseline -declined RHC this admission -with bump in Cr, will hold on PM dose of lasix today. Reassess in AM. May need to intravascularly equilibrate. May be able to start oral diuretics tomorrow.  NO CHANGE TODAY: Anasarca, hypoalbuminemia -workup per primary team -seen by heme/onc, no inpatient plans for further evaluation of elevated CA125 and unlikely to be a candidate for treatment. 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  Hypertension -stable, continue current medications  History of CVA Hyperlipidemia -continue aspirin 81 mg daily and atorvastatin 40 mg daily  Thoracic aortic aneurysm -4.1 cm -no acute intervention required -she has stated she does not want any procedures or surgeries. Given this, reasonable to not monitor as she would not want intervention  Jodelle Red, MD, PhD, [facility] [place]  [facility]    For questions or updates, please contact [facility] Please consult [email or web address] for contact info under        Signed, Jodelle Red, MD  08/10/2020, 1:05 PM

## 2020-08-11 ENCOUNTER — Other Ambulatory Visit (HOSPITAL_COMMUNITY): Payer: Self-pay

## 2020-08-11 DIAGNOSIS — I5033 Acute on chronic diastolic (congestive) heart failure: Secondary | ICD-10-CM | POA: Diagnosis not present

## 2020-08-11 LAB — BASIC METABOLIC PANEL
Anion gap: 7 (ref 5–15)
BUN: 46 mg/dL — ABNORMAL HIGH (ref 8–23)
CO2: 29 mmol/L (ref 22–32)
Calcium: 8 mg/dL — ABNORMAL LOW (ref 8.9–10.3)
Chloride: 100 mmol/L (ref 98–111)
Creatinine, Ser: 1.21 mg/dL — ABNORMAL HIGH (ref 0.44–1.00)
GFR, Estimated: 44 mL/min — ABNORMAL LOW (ref >60)
Glucose, Bld: 93 mg/dL (ref 70–99)
Potassium: 4.1 mmol/L (ref 3.5–5.1)
Sodium: 136 mmol/L (ref 135–145)

## 2020-08-11 LAB — HGB FRACTIONATION BY HPLC
Hgb A: 69.4 % — ABNORMAL LOW (ref 96.4–98.8)
Hgb C: 0 %
Hgb E: 27 % — ABNORMAL HIGH
Hgb F: 0 % (ref 0.0–2.0)
Hgb S: 0 %
Hgb Variant: 0 %

## 2020-08-11 LAB — HGB FRACTIONATION CASCADE: Hgb A2: 3.6 % — ABNORMAL HIGH (ref 1.8–3.2)

## 2020-08-11 MED ORDER — CARVEDILOL 3.125 MG PO TABS
3.1250 mg | ORAL_TABLET | Freq: Two times a day (BID) | ORAL | 2 refills | Status: DC
  Filled 2020-08-11: qty 60, 30d supply, fill #0

## 2020-08-11 MED ORDER — TORSEMIDE 20 MG PO TABS: 20.0000 mg | ORAL_TABLET | Freq: Two times a day (BID) | ORAL | Status: DC

## 2020-08-11 MED ORDER — EMPAGLIFLOZIN 10 MG PO TABS
10.0000 mg | ORAL_TABLET | Freq: Every day | ORAL | 2 refills | Status: DC
  Filled 2020-08-11: qty 30, 30d supply, fill #0

## 2020-08-11 MED ORDER — EMPAGLIFLOZIN 10 MG PO TABS: 10.0000 mg | ORAL_TABLET | Freq: Every day | ORAL | Status: DC

## 2020-08-11 MED ORDER — TORSEMIDE 20 MG PO TABS
20.0000 mg | ORAL_TABLET | Freq: Every day | ORAL | Status: DC
  Administered 2020-08-11: 20 mg via ORAL
  Filled 2020-08-11: qty 1

## 2020-08-11 MED ORDER — ATORVASTATIN CALCIUM 40 MG PO TABS
40.0000 mg | ORAL_TABLET | Freq: Every day | ORAL | 2 refills | Status: DC
  Filled 2020-08-11: qty 30, 30d supply, fill #0

## 2020-08-11 MED ORDER — ASPIRIN 81 MG PO TBEC: 81.0000 mg | DELAYED_RELEASE_TABLET | Freq: Every day | ORAL | 2 refills | Status: AC

## 2020-08-11 MED ORDER — TORSEMIDE 20 MG PO TABS
20.0000 mg | ORAL_TABLET | Freq: Two times a day (BID) | ORAL | 2 refills | Status: DC
  Filled 2020-08-11: qty 60, 30d supply, fill #0

## 2020-08-11 NOTE — Discharge Instructions (Addendum)
Heart Failure Education: Weigh yourself EVERY morning after you go to the bathroom but before you eat or drink anything. Write this number down in a weight log/diary. If you gain 3 pounds overnight or 5 pounds in a week, call the office. Your weight 08/11/20 was 131lbs Take your medicines as prescribed. If you have concerns about your medications, please call us before you stop taking them.  Eat low salt foods--Limit salt (sodium) to 2000 mg per day. This will help prevent your body from holding onto fluid. Read food labels as many processed foods have a lot of sodium, especially canned goods and prepackaged meats. If you would like some assistance choosing low sodium foods, we would be happy to set you up with a nutritionist. Stay as active as you can everyday. Staying active will give you more energy and make your muscles stronger. Start with 5 minutes at a time and work your way up to 30 minutes a day. Break up your activities--do some in the morning and some in the afternoon. Start with 3 days per week and work your way up to 5 days as you can.  If you have chest pain, feel short of breath, dizzy, or lightheaded, STOP. If you don't feel better after a short rest, call 911. If you do feel better, call the office to let us know you have symptoms with exercise. Limit all fluids for the day to less than 2 liters. Fluid includes all drinks, coffee, juice, ice chips, soup, jello, and all other liquids.

## 2020-08-11 NOTE — Discharge Summary (Signed)
Physician Discharge Summary  Erin Patterson JXB:[id number] DOB: 1932/06/14 DOA: 08/03/2020  PCP: Jackie Plum, MD  Admit date: 08/03/2020 Discharge date: 08/11/2020  Admitted From: Home Disposition:  Home  Recommendations for Outpatient Follow-up:  Follow up with PCP in 1 week Follow up with Cardiology as scheduled 7/7  Follow up with Dr. Myna Hidalgo  Outpatient palliative care follow-up  Discharge Condition: Stable CODE STATUS: DNR  Diet recommendation: Heart healthy, fluid restriction diet   Brief/Interim Summary: Erin Patterson is an 87 year old female with past medical history significant for chronic diastolic heart failure, hypertension, hyperlipidemia, CAD and dementia who presented to the hospital with complaints of fatigue, worsening lower extremity edema.  Patient had hypoalbuminemia for the past 2 months, had worsening swelling including her upper extremities and lower extremities.  In the emergency department, patient was found to have lower extremity edema/anasarca, thought to be secondary to CHF exacerbation.  She was started on IV Lasix, cardiology consulted.  Patient was supposed to undergo right heart catheterization, however patient declined work-up.  She was also evaluated by Dr. Myna Hidalgo, hematology/oncology, due to elevated CA125.  Patient to follow-up as an outpatient.  Patient continued to diurese, was switched to oral torsemide on discharge.  Discharge Diagnoses:  Principal Problem:   Acute on chronic diastolic CHF (congestive heart failure) (HCC) Active Problems:   HLD (hyperlipidemia)   Benign essential HTN   AKI (acute kidney injury) (HCC)   Anasarca   Stage 3a chronic kidney disease (HCC)   Adnexal fullness  Anasarca/lower extremity edema -Unclear etiology -Serum albumin is 2.0; UA obtained on 06/25/2020 was unremarkable.  Repeat UA is unremarkable. -CT abdomen pelvis chest obtained on 06/26/2020 was unremarkable -Echocardiogram on 06/22/2020 showed EF of 60 to  65%; normal right ventricular function and size -Cardiology recommended right heart cath however patient declined -Concern for right heart failure, diuresed very well with IV Lasix    Elevated CA125 -CA125 minimally elevated at 88; pelvic ultrasound obtained shows only simple cyst, ovaries not visualized bilaterally -Appreciate Dr. 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 Lisinopril on hold due to AKI   History of CVA -Continue aspirin, atorvastatin   Ascending thoracic aortic enlargement -CT abdomen/pelvis showed 4.1 cm ascending thoracic aortic caliber -Recommended annual follow-up by CT or MRI, but patient had stated that she would not want any procedures or surgeries regardless   Goals of care -Palliative care consulted -Patient is DNR -Outpatient palliative care follow-up    Discharge Instructions  Discharge Instructions     (HEART FAILURE PATIENTS) Call MD:  Anytime you have any of the following symptoms: 1) 3 pound weight gain in 24 hours or 5 pounds in 1 week 2) shortness of breath, with or without a dry hacking cough 3) swelling in the hands, feet or stomach 4) if you have to sleep on extra pillows at night in order to breathe.   Complete by: As directed    Call MD for:  difficulty breathing, headache or visual disturbances   Complete by: As directed    Call MD for:  extreme fatigue   Complete by: As directed    Call MD for:  persistant dizziness or light-headedness   Complete by: As directed    Call MD for:  persistant nausea and vomiting  Complete by: As directed    Call MD for:  severe uncontrolled pain   Complete by: As directed    Call MD for:  temperature >100.4   Complete by: As directed    Diet - low sodium heart  healthy   Complete by: As directed    Discharge instructions   Complete by: As directed    You were cared for by a hospitalist during your hospital stay. If you have any questions about your discharge medications or the care you received while you were in the hospital after you are discharged, you can call the unit and ask to speak with the hospitalist on call if the hospitalist that took care of you is not available. Once you are discharged, your primary care physician will handle any further medical issues. Please note that NO REFILLS for any discharge medications will be authorized once you are discharged, as it is imperative that you return to your primary care physician (or establish a relationship with a primary care physician if you do not have one) for your aftercare needs so that they can reassess your need for medications and monitor your lab values.   Increase activity slowly   Complete by: As directed       Allergies as of 08/11/2020   No Known Allergies      Medication List     STOP taking these medications    diltiazem 180 MG 24 hr capsule Commonly known as: CARDIZEM CD   furosemide 20 MG tablet Commonly known as: Lasix   lisinopril 20 MG tablet Commonly known as: ZESTRIL       TAKE these medications    aspirin 81 MG EC tablet Take 1 tablet (81 mg total) by mouth daily.   atorvastatin 40 MG tablet Commonly known as: LIPITOR Take 1 tablet (40 mg total) by mouth daily at 6 PM. What changed: when to take this   carvedilol 3.125 MG tablet Commonly known as: COREG Take 1 tablet (3.125 mg total) by mouth 2 (two) times daily with a meal. Take 3.125 mg by mouth in the morning and afternoon What changed: additional instructions   empagliflozin 10 MG Tabs tablet Commonly known as: Jardiance Take 1 tablet (10 mg total) by mouth daily before breakfast.   torsemide 20 MG tablet Commonly known as: DEMADEX Take 1 tablet (20 mg total) by mouth 2 (two) times daily.                Durable Medical Equipment  (From admission, onward)           Start     Ordered   08/07/20 1628  For home use only DME 4 wheeled rolling walker with seat  Once       Question:  Patient needs a walker to treat with the following condition  Answer:  Weakness   08/07/20 1628            Follow-up Information     [facility] Follow up.   Why: rollator will be brought to room prior to [employer] information: [address] [facility] [place] [address] [phone]         [facility] Follow up.   Why: outpatient palliative services Contact information: [address] [place] [address] [phone]        [facility]. Go on 08/31/2020.   Specialty: Cardiology Why: Please arrive 15 minutes early for your 11:30am post-hospital cardiology appointment with Gillian Shields, NP Contact  information: [address] [place] [phone]        Jackie Plumsei-Bonsu, George, MD. Schedule an appointment as soon as possible for a visit in 1 week(s).   Specialty: Internal Medicine Contact information: [address] [place] [address] [phone]         Josph MachoEnnever, Peter R, MD. Schedule an appointment as soon as possible for a visit in 1 month(s).   Specialty: Oncology Contact information: [address] [place] [address] [phone]                No Known Allergies  Consultations: Cardiology Palliative care medicine Oncology   Procedures/Studies: DG Chest 2 View  Result Date: 07/24/2020 CLINICAL DATA:  Lower extremity edema.  Fatigue.  History of CHF. EXAM: CHEST - 2 VIEW COMPARISON:  06/26/2020 and older exams. FINDINGS: Mild enlargement of the cardiopericardial silhouette, stable. No mediastinal or hilar masses. No evidence of adenopathy. Bilateral interstitial thickening with thickening of the fissures.  Additional opacity at the left lung base consistent with atelectasis. Small bilateral pleural effusions. No pneumothorax. Skeletal structures are demineralized but grossly intact. IMPRESSION: 1. Findings consistent with congestive heart failure with interstitial edema and small pleural effusions. Electronically Signed   By: Amie Portlandavid  Ormond M.D.   On: 07/24/2020 08:27   DG Chest Port 1 View  Result Date: 08/03/2020 CLINICAL DATA:  Weakness and dyspnea EXAM: PORTABLE CHEST 1 VIEW COMPARISON:  07/24/2020 FINDINGS: Cardiomegaly. Congested appearance of vessels with small pleural effusions greater on the left. Bulky mitral annular calcification. Negative for pneumothorax. Extensive artifact from EKG leads IMPRESSION: Vascular congestion and small pleural effusions. No significant change from 07/24/2020. Electronically Signed   By: Marnee SpringJonathon  Watts M.D.   On: 08/03/2020 10:58   US PELVIC COMPLETE WITH TRANSVAGINAL  Result Date: 08/04/2020 CLINICAL DATA:  Initial evaluation for anasarca. EXAM: TRANSABDOMINAL AND TRANSVAGINAL ULTRASOUND OF PELVIS TECHNIQUE: Both transabdominal and transvaginal ultrasound examinations of the pelvis were performed. Transabdominal technique was performed for global imaging of the pelvis including uterus, ovaries, adnexal regions, and pelvic cul-de-sac. It was necessary to proceed with endovaginal exam following the transabdominal exam to visualize the uterus, endometrium, and ovaries. COMPARISON:  Prior CT from 06/26/2020. FINDINGS: Uterus Measurements: 6.0 x 2.9 x 4.3 cm = volume: 38.2 mL. Uterus is anteverted. No discrete fibroid or other mass. Extensive vascular calcifications seen within the parametrial vasculature. Endometrium Small volume simple anechoic fluid present within the endometrial cavity. Endometrial stripe itself not well visualized. No visible endometrial thickening or other focal abnormality. Right ovary Not visualized.  No adnexal mass. Left ovary Native left ovary not  seen. There is a simple cyst within the left adnexa measuring 2.4 x 1.6 x 2.5 cm. No internal complexity, vascularity, or solid nodularity. Other findings No abnormal free fluid. IMPRESSION: 1. 2.5 cm simple left adnexal cyst, almost certainly benign given size and appearance. No followup imaging recommended. Note: This recommendation does not apply to premenarchal patients or to those with increased risk (genetic, family history, elevated tumor markers or other high-risk factors) of ovarian cancer. Reference: Radiology 2019 Nov; 293(2):359-371. 2. Nonvisualization of either ovary. No free fluid within the pelvis. 3. Trace simple fluid within the endometrial cavity. Electronically Signed   By: Rise MuBenjamin  McClintock M.D.   On: 08/04/2020 03:52       Discharge Exam: Vitals:   08/10/20 1943 08/11/20 0422  BP: 105/70 109/63  Pulse: 79 71  Resp: 18 18  Temp: 98.5  F (36.9 C) 98.2 F (36.8 C)  SpO2: 94% 97%     General: Pt is alert, awake, not in acute distress Cardiovascular: RRR, S1/S2 +, + bilateral edema Respiratory: CTA bilaterally, no wheezing, no rhonchi, no respiratory distress, no conversational dyspnea, on room air Abdominal: Soft, NT, ND, bowel sounds + Extremities: + Bilateral edema, no cyanosis Psych: Normal mood and affect, stable judgement and insight     The results of significant diagnostics from this hospitalization (including imaging, microbiology, ancillary and laboratory) are listed below for reference.     Microbiology: Recent Results (from the past 240 hour(s))  Resp Panel by RT-PCR (Flu A&B, Covid) Nasopharyngeal Swab     Status: None   Collection Time: 08/03/20 10:23 AM   Specimen: Nasopharyngeal Swab; Nasopharyngeal(NP) swabs in vial transport medium  Result Value Ref Range Status   SARS Coronavirus 2 by RT PCR NEGATIVE NEGATIVE Final    Comment: (NOTE) SARS-CoV-2 target nucleic acids are NOT DETECTED.  The SARS-CoV-2 RNA is generally detectable in upper  respiratory specimens during the acute phase of infection. The lowest concentration of SARS-CoV-2 viral copies this assay can detect is 138 copies/mL. A negative result does not preclude SARS-Cov-2 infection and should not be used as the sole basis for treatment or other patient management decisions. A negative result may occur with  improper specimen collection/handling, submission of specimen other than nasopharyngeal swab, presence of viral mutation(s) within the areas targeted by this assay, and inadequate number of viral copies(<138 copies/mL). A negative result must be combined with clinical observations, patient history, and epidemiological information. The expected result is Negative.  Fact Sheet for Patients:  BloggerCourse.com  Fact Sheet for Healthcare Providers:  SeriousBroker.it  This test is no t yet approved or cleared by the [place] FDA and  has been authorized for detection and/or diagnosis of SARS-CoV-2 by FDA under an Emergency Use Authorization (EUA). This EUA will remain  in effect (meaning this test can be used) for the duration of the COVID-19 declaration under Section 564(b)(1) of the Act, 21 U.S.C.section 360bbb-3(b)(1), unless the authorization is terminated  or revoked sooner.       Influenza A by PCR NEGATIVE NEGATIVE Final   Influenza B by PCR NEGATIVE NEGATIVE Final    Comment: (NOTE) The Xpert Xpress SARS-CoV-2/FLU/RSV plus assay is intended as an aid in the diagnosis of influenza from Nasopharyngeal swab specimens and should not be used as a sole basis for treatment. Nasal washings and aspirates are unacceptable for Xpert Xpress SARS-CoV-2/FLU/RSV testing.  Fact Sheet for Patients: BloggerCourse.com  Fact Sheet for Healthcare Providers: SeriousBroker.it  This test is not yet approved or cleared by the [place] FDA and has been  authorized for detection and/or diagnosis of SARS-CoV-2 by FDA under an Emergency Use Authorization (EUA). This EUA will remain in effect (meaning this test can be used) for the duration of the COVID-19 declaration under Section 564(b)(1) of the Act, 21 U.S.C. section 360bbb-3(b)(1), unless the authorization is terminated or revoked.  Performed at [facility], [address], [place], [place] [address]      Labs: BNP (last 3 results) Recent Labs    06/21/20 1616 07/24/20 0900 08/03/20 1023  BNP 550.4* 629.1* 315.0*   Basic Metabolic Panel: Recent Labs  Lab 08/07/20 0910 08/08/20 0215 08/09/20 0223 08/10/20 0219 08/11/20 0146  NA 137 134* 135 136 136  K 4.1 3.7 3.6 3.7 4.1  CL 102 98 98 98 100  CO2 30 30 31  32 29  GLUCOSE 86 94 104* 94 93  BUN 35* 37* 37* 42* 46*  CREATININE 1.08* 1.05* 1.08* 1.30* 1.21*  CALCIUM 8.3* 7.8* 7.9* 7.8* 8.0*   Liver Function Tests: Recent Labs  Lab 08/08/20 0215 08/09/20 0223  AST 53* 50*  ALT 32 29  ALKPHOS 62 62  BILITOT 0.8 0.4  PROT 6.4* 6.5  ALBUMIN 2.1* 2.1*   No results for input(s): LIPASE, AMYLASE in the last 168 hours. No results for input(s): AMMONIA in the last 168 hours. CBC: Recent Labs  Lab 08/09/20 0223  WBC 7.6  HGB 9.3*  HCT 26.8*  MCV 77.2*  PLT 161   Cardiac Enzymes: No results for input(s): CKTOTAL, CKMB, CKMBINDEX, TROPONINI in the last 168 hours. BNP: Invalid input(s): POCBNP CBG: No results for input(s): GLUCAP in the last 168 hours. D-Dimer No results for input(s): DDIMER in the last 72 hours. Hgb A1c No results for input(s): HGBA1C in the last 72 hours. Lipid Profile No results for input(s): CHOL, HDL, LDLCALC, TRIG, CHOLHDL, LDLDIRECT in the last 72 hours. Thyroid function studies No results for input(s): TSH, T4TOTAL, T3FREE, THYROIDAB in the last 72 hours.  Invalid input(s): FREET3 Anemia work up No results for input(s): VITAMINB12, FOLATE, FERRITIN, TIBC, IRON, RETICCTPCT  in the last 72 hours. Urinalysis    Component Value Date/Time   COLORURINE YELLOW 08/04/2020 0828   APPEARANCEUR CLEAR 08/04/2020 0828   LABSPEC 1.013 08/04/2020 0828   PHURINE 5.0 08/04/2020 0828   GLUCOSEU NEGATIVE 08/04/2020 0828   HGBUR NEGATIVE 08/04/2020 0828   BILIRUBINUR NEGATIVE 08/04/2020 0828   KETONESUR NEGATIVE 08/04/2020 0828   PROTEINUR NEGATIVE 08/04/2020 0828   UROBILINOGEN 1.0 12/10/2014 1225   NITRITE NEGATIVE 08/04/2020 0828   LEUKOCYTESUR SMALL (A) 08/04/2020 0828   Sepsis Labs Invalid input(s): PROCALCITONIN,  WBC,  LACTICIDVEN Microbiology Recent Results (from the past 240 hour(s))  Resp Panel by RT-PCR (Flu A&B, Covid) Nasopharyngeal Swab     Status: None   Collection Time: 08/03/20 10:23 AM   Specimen: Nasopharyngeal Swab; Nasopharyngeal(NP) swabs in vial transport medium  Result Value Ref Range Status   SARS Coronavirus 2 by RT PCR NEGATIVE NEGATIVE Final    Comment: (NOTE) SARS-CoV-2 target nucleic acids are NOT DETECTED.  The SARS-CoV-2 RNA is generally detectable in upper respiratory specimens during the acute phase of infection. The lowest concentration of SARS-CoV-2 viral copies this assay can detect is 138 copies/mL. A negative result does not preclude SARS-Cov-2 infection and should not be used as the sole basis for treatment or other patient management decisions. A negative result may occur with  improper specimen collection/handling, submission of specimen other than nasopharyngeal swab, presence of viral mutation(s) within the areas targeted by this assay, and inadequate number of viral copies(<138 copies/mL). A negative result must be combined with clinical observations, patient history, and epidemiological information. The expected result is Negative.  Fact Sheet for Patients:  BloggerCourse.com  Fact Sheet for Healthcare Providers:  SeriousBroker.it  This test is no t yet approved  or cleared by the [place] FDA and  has been authorized for detection and/or diagnosis of SARS-CoV-2 by FDA under an Emergency Use Authorization (EUA). This EUA will remain  in effect (meaning this test can be used) for the duration of the COVID-19 declaration under Section 564(b)(1) of the Act, 21 U.S.C.section 360bbb-3(b)(1), unless the authorization is terminated  or revoked sooner.       Influenza A by PCR NEGATIVE NEGATIVE Final   Influenza B by  PCR NEGATIVE NEGATIVE Final    Comment: (NOTE) The Xpert Xpress SARS-CoV-2/FLU/RSV plus assay is intended as an aid in the diagnosis of influenza from Nasopharyngeal swab specimens and should not be used as a sole basis for treatment. Nasal washings and aspirates are unacceptable for Xpert Xpress SARS-CoV-2/FLU/RSV testing.  Fact Sheet for Patients: BloggerCourse.com  Fact Sheet for Healthcare Providers: SeriousBroker.it  This test is not yet approved or cleared by the [place] FDA and has been authorized for detection and/or diagnosis of SARS-CoV-2 by FDA under an Emergency Use Authorization (EUA). This EUA will remain in effect (meaning this test can be used) for the duration of the COVID-19 declaration under Section 564(b)(1) of the Act, 21 U.S.C. section 360bbb-3(b)(1), unless the authorization is terminated or revoked.  Performed at [facility], [address] [place], [place] [address]      Patient was seen and examined on the day of discharge and was found to be in stable condition. Time coordinating discharge: 25 minutes including assessment and coordination of care, as well as examination of the patient.   SIGNED:  Noralee Stain, DO [facility] 08/11/2020, 10:53 AM

## 2020-08-11 NOTE — Progress Notes (Signed)
Progress Note  Patient Name: Erin Patterson Date of Encounter: 08/11/2020  [facility] Cardiologist: Jodelle Red, MD   Subjective   husband present in the room. Interview with assistance of interpreter Erin Patterson [phone]. Her breathing is stable at rest, but still feels short of breath when she walks. No chest pain, lightheadednes/dizzyness. Leg swelling present but improving.   Inpatient Medications    Scheduled Meds:  aspirin EC  81 mg Oral Daily   atorvastatin  40 mg Oral q1800   carvedilol  3.125 mg Oral BID WC   docusate sodium  100 mg Oral BID   enoxaparin (LOVENOX) injection  30 mg Subcutaneous Q24H   feeding supplement  237 mL Oral BID BM   multivitamin with minerals  1 tablet Oral Daily   polyethylene glycol  17 g Oral Daily   sodium chloride flush  3 mL Intravenous Q12H   torsemide  20 mg Oral BID   Continuous Infusions:   PRN Meds: acetaminophen **OR** acetaminophen, bisacodyl, hydrALAZINE, morphine injection, ondansetron **OR** ondansetron (ZOFRAN) IV, oxyCODONE, traZODone   Vital Signs    Vitals:   08/10/20 1106 08/10/20 1622 08/10/20 1943 08/11/20 0422  BP: 124/60 121/72 105/70 109/63  Pulse: 72 77 79 71  Resp: 18 18 18 18   Temp: 98.1 F (36.7 C) 98.5 F (36.9 C) 98.5 F (36.9 C) 98.2 F (36.8 C)  TempSrc: Oral Oral Oral Oral  SpO2: 96%  94% 97%  Weight:    59.7 kg  Height:        Intake/Output Summary (Last 24 hours) at 08/11/2020 1018 Last data filed at 08/11/2020 0804 Gross per 24 hour  Intake 720 ml  Output 400 ml  Net 320 ml   Last 3 Weights 08/11/2020 08/10/2020 08/09/2020  Weight (lbs) 131 lb 9.6 oz 132 lb 133 lb 6.4 oz  Weight (kg) 59.693 kg 59.875 kg 60.51 kg      Telemetry    NSR- Personally Reviewed  ECG    No new since 08/03/20 - Personally Reviewed  Physical Exam  GEN: Well nourished, well developed in no acute distress NECK: No JVD sitting upright CARDIAC: regular rhythm, normal S1 and S2, no rubs or gallops. Soft  systolic murmur. VASCULAR: Radial pulses 2+ bilaterally.  RESPIRATORY:  Clear to auscultation without wheezing or rhonchi. Bilateral rales ABDOMEN: Soft, non-tender, non-distended MUSCULOSKELETAL:  Moves all 4 limbs independently SKIN: Warm and dry,. Bilateral legs with continued edema, but decreased. Compression stockings that have been in place are now loose. NEUROLOGIC:  No focal neuro deficits noted. PSYCHIATRIC:  Normal affect    Labs    High Sensitivity Troponin:   Recent Labs  Lab 08/03/20 1023 08/03/20 1216  TROPONINIHS 20* 15      Chemistry Recent Labs  Lab 08/08/20 0215 08/09/20 0223 08/10/20 0219 08/11/20 0146  NA 134* 135 136 136  K 3.7 3.6 3.7 4.1  CL 98 98 98 100  CO2 30 31 32 29  GLUCOSE 94 104* 94 93  BUN 37* 37* 42* 46*  CREATININE 1.05* 1.08* 1.30* 1.21*  CALCIUM 7.8* 7.9* 7.8* 8.0*  PROT 6.4* 6.5  --   --   ALBUMIN 2.1* 2.1*  --   --   AST 53* 50*  --   --   ALT 32 29  --   --   ALKPHOS 62 62  --   --   BILITOT 0.8 0.4  --   --   GFRNONAA 52* 50* 40* 44*  ANIONGAP  6 6 6 7      Hematology Recent Labs  Lab 08/08/20 0215 08/09/20 0223  WBC  --  7.6  RBC 3.69* 3.47*  HGB  --  9.3*  HCT  --  26.8*  MCV  --  77.2*  MCH  --  26.8  MCHC  --  34.7  RDW  --  17.4*  PLT  --  161    BNP No results for input(s): BNP, PROBNP in the last 168 hours.    DDimer No results for input(s): DDIMER in the last 168 hours.   Radiology    No results found.  Cardiac Studies   Echo 06/22/20   1. Left ventricular ejection fraction, by estimation, is 60 to 65%. The  left ventricle has normal function. The left ventricle has no regional  wall motion abnormalities. There is moderate concentric left ventricular  hypertrophy. Left ventricular  diastolic function could not be evaluated.   2. Right ventricular systolic function is normal. The right ventricular  size is normal.   3. The mitral valve is degenerative. Mild mitral valve regurgitation. No   evidence of mitral stenosis. Moderate to severe mitral annular  calcification.   4. The aortic valve is tricuspid. There is mild calcification of the  aortic valve. Aortic valve regurgitation is mild. No aortic stenosis is  present.   Patient Profile     87 y.o. female with PMH of chronic diastolic heart failure, hypertension, hyperlipidemia, history of ischemic CVA 07/2017, and chronic anemia who presented with worsening fatigue and edema found to have acute on chronic diastolic HF exacerbation for which Cardiology has been consulted.   Assessment & Plan    Acute on chronic diastolic heart failure Acute kidney injury, resolved Anasarca, hypoalbuminemia -I/O inaccurate. Weight from 65.5 kg to 59.7 kg today -Cr 1.21 today, slightly above her baseline but down from 1.30 yesterday -declined RHC this admission -starting oral torsemide today. With her anasarca, suspect she may not absorb oral furosemide well. We discussed that with her third spacing, it will likely take weeks to resolve all of the fluid. She is comfortable at rest but short of breath on exertion. We discussed that she should weight herself daily and contact the office if her weight goes up or she gets more short of breath.  -will also start Jardiance today as cr improving  NO CHANGE TODAY: Anasarca, hypoalbuminemia -workup per primary team -seen by heme/onc, no inpatient plans for further evaluation of elevated CA125 and unlikely to be a candidate for treatment. 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  Hypertension -stable, continue current medications  History of CVA Hyperlipidemia -continue aspirin 81 mg daily and atorvastatin 40 mg daily  Thoracic aortic aneurysm -4.1 cm -no acute intervention required -she has stated she does not want any procedures or surgeries. Given this, reasonable to not monitor as she would not want intervention  [facility] will sign off.   Medication Recommendations:   Aspirin 81 mg  daily Atorvastatin 40 mg daily Carvedilol 3.125 mg daily Torsemide 20 mg BID Jardiance 10 mg daily (previously ordered but does not appear that she was taking, restart today) Other recommendations (labs, testing, etc):  bmet at follow up Follow up as an outpatient:  We will arrange for her to follow up with me or a member of my team as soon as possible after discharge.  08/2017, MD, PhD, [facility] [place]  [facility]    For questions or updates, please contact [facility] Please consult [email or web address] for  contact info under        Signed, Jodelle Red, MD  08/11/2020, 10:18 AM

## 2020-08-11 NOTE — Significant Event (Signed)
Patient d/c earlier than planned, daughter was not present, interpreter was utilized with spouse and patient understanding discharge instructions. Meds packed with belongings and neighbors that are older adults non -english speaking transporting home.

## 2020-08-11 NOTE — TOC Transition Note (Signed)
Transition of Care [facility]) - CM/SW Discharge Note   Patient Details  Name: Kodie Pick MRN: [id number] Date of Birth: November 01, 1932  [facility]) CM/SW Contact:  Leone Haven, RN Phone Number: 08/11/2020, 12:14 PM   Clinical Narrative:    Patient is for dc today, NCM spoke with patient with video interpreter about the [employer]  referral for community services, patient and spouse would like this NCM to send referral to them so they can get some assistance .  Daughter will transport patient home at dc.  Staff RN said daughter will come when she gets off work.     Final next level of care: Home/Self Care Barriers to Discharge: No Barriers Identified   Patient Goals and CMS Choice Patient states their goals for this hospitalization and ongoing recovery are:: retur home CMS Medicare.gov Compare Post Acute Care list provided to:: Patient Choice offered to / list presented to : Patient  Discharge Placement                       Discharge Plan and Services                DME Arranged: Walker rolling with seat DME Agency: [employer] Date DME Agency Contacted: 08/07/20 Time DME Agency Contacted: [phone] Representative spoke with at DME Agency: [facility] Arranged: NA          Social Determinants of Health (SDOH) Interventions     Readmission Risk Interventions No flowsheet data found.

## 2020-08-15 ENCOUNTER — Encounter (HOSPITAL_COMMUNITY): Payer: Medicaid Other

## 2020-08-31 ENCOUNTER — Ambulatory Visit (INDEPENDENT_AMBULATORY_CARE_PROVIDER_SITE_OTHER): Payer: Medicaid Other | Admitting: Family

## 2020-08-31 ENCOUNTER — Other Ambulatory Visit: Payer: Self-pay | Admitting: Family

## 2020-08-31 ENCOUNTER — Encounter (HOSPITAL_BASED_OUTPATIENT_CLINIC_OR_DEPARTMENT_OTHER): Payer: Self-pay | Admitting: Family

## 2020-08-31 ENCOUNTER — Other Ambulatory Visit: Payer: Self-pay

## 2020-08-31 VITALS — BP 146/76 | HR 68 | Ht 61.0 in | Wt 130.4 lb

## 2020-08-31 DIAGNOSIS — Z8673 Personal history of transient ischemic attack (TIA), and cerebral infarction without residual deficits: Secondary | ICD-10-CM

## 2020-08-31 DIAGNOSIS — I679 Cerebrovascular disease, unspecified: Secondary | ICD-10-CM

## 2020-08-31 DIAGNOSIS — I10 Essential (primary) hypertension: Secondary | ICD-10-CM

## 2020-08-31 DIAGNOSIS — I5032 Chronic diastolic (congestive) heart failure: Secondary | ICD-10-CM

## 2020-08-31 MED ORDER — CARVEDILOL 3.125 MG PO TABS: 3.1250 mg | ORAL_TABLET | Freq: Two times a day (BID) | ORAL | 2 refills | Status: DC

## 2020-08-31 MED ORDER — ATORVASTATIN CALCIUM 40 MG PO TABS: 40.0000 mg | ORAL_TABLET | Freq: Every day | ORAL | 2 refills | Status: AC

## 2020-08-31 MED ORDER — TORSEMIDE 20 MG PO TABS: 20.0000 mg | ORAL_TABLET | Freq: Two times a day (BID) | ORAL | 2 refills | Status: DC

## 2020-08-31 MED ORDER — EMPAGLIFLOZIN 10 MG PO TABS: 10.0000 mg | ORAL_TABLET | Freq: Every day | ORAL | 2 refills | Status: AC

## 2020-08-31 NOTE — Patient Instructions (Signed)
Medication Instructions:  Your physician has recommended you make the following change in your medication:   Be sure to take TORSEMIDE and CARVEDILOL twice per day. These have the star on them.  *If you need a refill on your cardiac medications before your next appointment, please call your pharmacy*   Lab Work: Your physician recommends lab work today: BMP  If you have labs (blood work) drawn today and your tests are completely normal, you will receive your results only by: MyChart Message (if you have MyChart) OR A paper copy in the mail If you have any lab test that is abnormal or we need to change your treatment, we will call you to review the results.   Testing/Procedures: None ordered today.    Follow-Up: At [facility], you and your health needs are our priority.  As part of our continuing mission to provide you with exceptional heart care, we have created designated Provider Care Teams.  These Care Teams include your primary Cardiologist (physician) and Advanced Practice Providers (APPs -  Physician Assistants and Nurse Practitioners) who all work together to provide you with the care you need, when you need it.  We recommend signing up for the patient portal called "MyChart".  Sign up information is provided on this After Visit Summary.  MyChart is used to connect with patients for Virtual Visits (Telemedicine).  Patients are able to view lab/test results, encounter notes, upcoming appointments, etc.  Non-urgent messages can be sent to your provider as well.   To learn more about what you can do with MyChart, go to [email or web address].    Your next appointment:   09/20/20 at 9 AM with Erin Sorrow, NP    Other Instructions

## 2020-08-31 NOTE — Progress Notes (Signed)
Office Visit    Patient Name: Erin Patterson Date of Encounter: 08/31/2020  PCP:  Jackie Plum, MD   [facility]  Cardiologist:  Jodelle Red, MD  Advanced Practice Provider:  No care team member to display Electrophysiologist:  None    Chief Complaint    Erin Patterson is a 87 y.o. female with a hx of diastolic heart failure, HTN, HLD, CAD, dementia, CVA 07/2017 presents today for hospital follow-up  Past Medical History    Past Medical History:  Diagnosis Date   CHF (congestive heart failure) (HCC)    Hypercholesteremia    Hypertension    MI (myocardial infarction) (HCC)    Stroke (cerebrum) (HCC)    years ago--episode of "bleeding from right ear and onset of weakness"    Past Surgical History:  Procedure Laterality Date   TEE WITHOUT CARDIOVERSION N/A 07/30/2017   Procedure: TRANSESOPHAGEAL ECHOCARDIOGRAM (TEE);  Surgeon: Laurey Morale, MD;  Location: [facility];  Service: Cardiovascular;  Laterality: N/A;    Allergies  No Known Allergies  History of Present Illness    Erin Patterson is a 87 y.o. female with a hx of diastolic heart failure, HTN, HLD, CAD, dementia, CVA 07/2017 last seen while hospitalized.  She was admitted June 2022 due to worsening fatigue and edema found to have acute on chronic diastolic heart failure exacerbation.  Also with anasarca, hypoalbuminemia, acute kidney injury.  There was some suspicion that with anasarca she may not to be able to absorb oral furosemide well so she was transitioned to her torsemide.  She was additionally discharged on Jardiance for optimization of heart failure therapy.  She was noted to have a 4.1 cm thoracic aortic aneurysm and did not desire any procedures or surgeries.  She also politely declined cardiac catheterization during admission as she did not want invasive procedures.  She presents today for follow-up with her daughter.  [place] interpreter was the utilized via  phone.  She notes persistent lower extremity edema associated with some discomfort.  On review of her medications we found that she has only been taking her torsemide and carvedilol once per day.  Long discussion with the interpreter regarding how to take carvedilol and torsemide twice per day as directed and marked those bottles with a star so she would be able to know which ones they were given language barrier.  She reports no chest pain, pressure, tightness.  Reports her dyspnea on exertion is overall improving.  Denies orthopnea, PND.  EKGs/Labs/Other Studies Reviewed:   The following studies were reviewed today:  Echo 06/22/20   1. Left ventricular ejection fraction, by estimation, is 60 to 65%. The  left ventricle has normal function. The left ventricle has no regional  wall motion abnormalities. There is moderate concentric left ventricular  hypertrophy. Left ventricular  diastolic function could not be evaluated.   2. Right ventricular systolic function is normal. The right ventricular  size is normal.   3. The mitral valve is degenerative. Mild mitral valve regurgitation. No  evidence of mitral stenosis. Moderate to severe mitral annular  calcification.   4. The aortic valve is tricuspid. There is mild calcification of the  aortic valve. Aortic valve regurgitation is mild. No aortic stenosis is  present.    EKG: No EKG today  Recent Labs: 06/23/2020: TSH 1.575 06/25/2020: Magnesium 1.9 08/03/2020: B Natriuretic Peptide 315.0 08/09/2020: ALT 29; Hemoglobin 9.3; Platelets 161 08/11/2020: BUN 46; Creatinine, Ser 1.21; Potassium 4.1; Sodium  136  Recent Lipid Panel    Component Value Date/Time   CHOL 201 (H) 07/27/2017 0712   TRIG 44 07/27/2017 0712   HDL 66 07/27/2017 0712   CHOLHDL 3.0 07/27/2017 0712   VLDL 9 07/27/2017 0712   LDLCALC 126 (H) 07/27/2017 0712   Home Medications   Current Meds  Medication Sig   aspirin 81 MG EC tablet Take 1 tablet (81 mg total) by mouth  daily.   atorvastatin (LIPITOR) 40 MG tablet Take 1 tablet (40 mg total) by mouth daily at 6 PM.   carvedilol (COREG) 3.125 MG tablet Take 1 tablet (3.125 mg total) by mouth 2 (two) times daily with a meal. Take 3.125 mg by mouth in the morning and afternoon   empagliflozin (JARDIANCE) 10 MG TABS tablet Take 1 tablet (10 mg total) by mouth daily before breakfast.   torsemide (DEMADEX) 20 MG tablet Take 1 tablet (20 mg total) by mouth 2 (two) times daily.     Review of Systems      All other systems reviewed and are otherwise negative except as noted above.  Physical Exam    VS:  BP (!) 146/76   Pulse 68   Ht 5\' 1"  (1.549 m)   Wt 130 lb 6.4 oz (59.1 kg)   SpO2 99%   BMI 24.64 kg/m  , BMI Body mass index is 24.64 kg/m.  Wt Readings from Last 3 Encounters:  08/31/20 130 lb 6.4 oz (59.1 kg)  08/11/20 131 lb 9.6 oz (59.7 kg)  06/27/20 132 lb 4.8 oz (60 kg)     GEN: Well nourished, well developed, in no acute distress. HEENT: normal. Neck: Supple, no JVD, carotid bruits, or masses. Cardiac: RRR, no murmurs, rubs, or gallops. No clubbing, cyanosis.  Bilateral 1+ pitting edema..  Radials/PT 2+ and equal bilaterally.  Respiratory:  Respirations regular and unlabored, clear to auscultation bilaterally. GI: Soft, nontender, nondistended. MS: No deformity or atrophy. Skin: Warm and dry, no rash. Neuro:  Strength and sensation are intact. Psych: Normal affect.  Assessment & Plan    Chronic diastolic heart failure -volume overloaded on exam with pitting lower extremity edema.  GDMT with torsemide, Jardiance, Coreg.  Of note she has not been taking Coreg or torsemide twice daily as prescribed she was unaware of these directions.  We have marked her bottles with a star so she knows she is to take these 2 medications twice per day.  Long discussion with her and the interpreter to clarify medication usage.  BMP today. Refills provided  Anasarca / Hypoalbuminemia - Elevated CA125 -does not  desire invasive procedures or therapies at this time.  Volume management, as above.  Continue to follow-up with primary care.  HTN - BP well controlled. Continue current antihypertensive regimen.    History of CVA -continue aspirin, atorvastatin.  No signs of recurrence.  HLD -continue atorvastatin 40 mg daily.  TAA - 4.1cm.  Does not wish for surgical intervention so will defer monitoring.  Disposition: Follow up in 1 month(s) with Dr. 08/27/20 or APP.  Signed, Cristal Deer, NP 08/31/2020, 11:33 AM [facility]

## 2020-09-01 LAB — BASIC METABOLIC PANEL
BUN/Creatinine Ratio: 29 — ABNORMAL HIGH (ref 12–28)
BUN: 33 mg/dL — ABNORMAL HIGH (ref 8–27)
CO2: 27 mmol/L (ref 20–29)
Calcium: 8.3 mg/dL — ABNORMAL LOW (ref 8.7–10.3)
Chloride: 102 mmol/L (ref 96–106)
Creatinine, Ser: 1.14 mg/dL — ABNORMAL HIGH (ref 0.57–1.00)
Glucose: 85 mg/dL (ref 65–99)
Potassium: 4 mmol/L (ref 3.5–5.2)
Sodium: 140 mmol/L (ref 134–144)
eGFR: 47 mL/min/{1.73_m2} — ABNORMAL LOW (ref >59)

## 2020-09-04 ENCOUNTER — Telehealth: Payer: Self-pay

## 2020-09-04 NOTE — Telephone Encounter (Signed)
Attempted to contact patient's daughter Micheline Rough to schedule a Palliative Care consult appointment. No answer left a message to return call.

## 2020-09-08 ENCOUNTER — Encounter (HOSPITAL_BASED_OUTPATIENT_CLINIC_OR_DEPARTMENT_OTHER): Payer: Self-pay

## 2020-09-12 ENCOUNTER — Telehealth: Payer: Self-pay

## 2020-09-12 NOTE — Telephone Encounter (Signed)
Attempted to contact patient's husband and daughter to schedule a Palliative Care consult appointment. No answer left a message to return call.

## 2020-09-15 ENCOUNTER — Encounter: Payer: Self-pay | Admitting: *Deleted

## 2020-09-20 ENCOUNTER — Encounter (HOSPITAL_BASED_OUTPATIENT_CLINIC_OR_DEPARTMENT_OTHER): Payer: Self-pay | Admitting: Family

## 2020-09-20 ENCOUNTER — Other Ambulatory Visit: Payer: Self-pay

## 2020-09-20 ENCOUNTER — Telehealth: Payer: Self-pay

## 2020-09-20 ENCOUNTER — Ambulatory Visit (INDEPENDENT_AMBULATORY_CARE_PROVIDER_SITE_OTHER): Payer: Medicaid Other | Admitting: Family

## 2020-09-20 VITALS — BP 146/80 | HR 56 | Ht 61.0 in | Wt 135.0 lb

## 2020-09-20 DIAGNOSIS — I10 Essential (primary) hypertension: Secondary | ICD-10-CM

## 2020-09-20 DIAGNOSIS — E782 Mixed hyperlipidemia: Secondary | ICD-10-CM | POA: Diagnosis not present

## 2020-09-20 DIAGNOSIS — Z8673 Personal history of transient ischemic attack (TIA), and cerebral infarction without residual deficits: Secondary | ICD-10-CM

## 2020-09-20 DIAGNOSIS — I5032 Chronic diastolic (congestive) heart failure: Secondary | ICD-10-CM

## 2020-09-20 DIAGNOSIS — I712 Thoracic aortic aneurysm, without rupture, unspecified: Secondary | ICD-10-CM

## 2020-09-20 DIAGNOSIS — E8809 Other disorders of plasma-protein metabolism, not elsewhere classified: Secondary | ICD-10-CM

## 2020-09-20 DIAGNOSIS — R601 Generalized edema: Secondary | ICD-10-CM

## 2020-09-20 MED ORDER — TORSEMIDE 20 MG PO TABS: 40.0000 mg | ORAL_TABLET | Freq: Every day | ORAL | 5 refills | Status: AC

## 2020-09-20 MED ORDER — TORSEMIDE 20 MG PO TABS: 40.0000 mg | ORAL_TABLET | Freq: Every day | ORAL | 5 refills | Status: DC

## 2020-09-20 NOTE — Telephone Encounter (Signed)
Third attempt to contact patient's spouse and daughter to schedule a Palliative Care consult appointment. No answer left a message. If no return call by 7/29 will cancel referral and notify PCP.

## 2020-09-20 NOTE — Progress Notes (Signed)
Office Visit    Patient Name: Erin Patterson Date of Encounter: 09/20/2020  PCP:  Jackie Plum, MD   [facility]  Cardiologist:  Jodelle Red, MD  Advanced Practice Provider:  No care team member to display Electrophysiologist:  None    Chief Complaint    Erin Patterson is a 87 y.o. female with a hx of diastolic heart failure, HTN, HLD, CAD, dementia, CVA 07/2017 presents today for hospital follow-up  Past Medical History    Past Medical History:  Diagnosis Date   CHF (congestive heart failure) (HCC)    Hypercholesteremia    Hypertension    MI (myocardial infarction) (HCC)    Stroke (cerebrum) (HCC)    years ago--episode of "bleeding from right ear and onset of weakness"    Past Surgical History:  Procedure Laterality Date   TEE WITHOUT CARDIOVERSION N/A 07/30/2017   Procedure: TRANSESOPHAGEAL ECHOCARDIOGRAM (TEE);  Surgeon: Laurey Morale, MD;  Location: [facility];  Service: Cardiovascular;  Laterality: N/A;    Allergies  No Known Allergies  History of Present Illness    Erin Patterson is a 87 y.o. female with a hx of diastolic heart failure, HTN, HLD, CAD, dementia, CVA 07/2017 last seen 08/31/20.  She was admitted June 2022 due to worsening fatigue and edema found to have acute on chronic diastolic heart failure exacerbation.  Also with anasarca, hypoalbuminemia, acute kidney injury.  There was some suspicion that with anasarca she may not to be able to absorb oral furosemide well so she was transitioned to her torsemide.  She was additionally discharged on Jardiance for optimization of heart failure therapy.  She was noted to have a 4.1 cm thoracic aortic aneurysm and did not desire any procedures or surgeries.  She also politely declined cardiac catheterization during admission as she did not want invasive procedures.  She was seen 08/31/20 in follow up with her daughter. Noted persistent lower extremity edema associated with  some discomfort.  On review of her medications we found that she has only been taking her torsemide and carvedilol once per day.  Long discussion with the interpreter regarding how to take carvedilol and torsemide twice per day as directed and marked those bottles with a star.  Presents today for follow up independently. Interpretor present at time of exam. Her weight today is up 5 pounds over the last month. She brings pill bottles and has Diltiazem 180mg  QD but not Carvedilol - appears change was made by primary care but no notes available for my review. It was filled on 08/28/20. Anticipate primary care may have changed for easier one time dosing but unclear.  She has a bottle of Furosemide 20mg  as well as Torsemide 20mg  with her. The bottles at last clinic visit that I marked with a star are no longer with her, anticipate she got refills.  She continues to experience painful LE edema but tells me her dyspnea on exertion is stable at her baseline.   EKGs/Labs/Other Studies Reviewed:   The following studies were reviewed today:  Echo 06/22/20   1. Left ventricular ejection fraction, by estimation, is 60 to 65%. The  left ventricle has normal function. The left ventricle has no regional  wall motion abnormalities. There is moderate concentric left ventricular  hypertrophy. Left ventricular  diastolic function could not be evaluated.   2. Right ventricular systolic function is normal. The right ventricular  size is normal.   3. The mitral valve is degenerative.  Mild mitral valve regurgitation. No  evidence of mitral stenosis. Moderate to severe mitral annular  calcification.   4. The aortic valve is tricuspid. There is mild calcification of the  aortic valve. Aortic valve regurgitation is mild. No aortic stenosis is  present.    EKG: No EKG today  Recent Labs: 06/23/2020: TSH 1.575 06/25/2020: Magnesium 1.9 08/03/2020: B Natriuretic Peptide 315.0 08/09/2020: ALT 29; Hemoglobin 9.3; Platelets  161 08/31/2020: BUN 33; Creatinine, Ser 1.14; Potassium 4.0; Sodium 140  Recent Lipid Panel    Component Value Date/Time   CHOL 201 (H) 07/27/2017 0712   TRIG 44 07/27/2017 0712   HDL 66 07/27/2017 0712   CHOLHDL 3.0 07/27/2017 0712   VLDL 9 07/27/2017 0712   LDLCALC 126 (H) 07/27/2017 0712   Home Medications   Current Meds  Medication Sig   aspirin 81 MG EC tablet Take 1 tablet (81 mg total) by mouth daily.   atorvastatin (LIPITOR) 40 MG tablet Take 1 tablet (40 mg total) by mouth daily at 6 PM.   carvedilol (COREG) 3.125 MG tablet Take 1 tablet (3.125 mg total) by mouth 2 (two) times daily with a meal. Take 3.125 mg by mouth in the morning and afternoon   empagliflozin (JARDIANCE) 10 MG TABS tablet Take 1 tablet (10 mg total) by mouth daily before breakfast.   torsemide (DEMADEX) 20 MG tablet Take 1 tablet (20 mg total) by mouth 2 (two) times daily.     Review of Systems      All other systems reviewed and are otherwise negative except as noted above.  Physical Exam    VS:  BP (!) 146/80   Pulse (!) 56   Ht 5\' 1"  (1.549 m)   Wt 135 lb (61.2 kg)   SpO2 90%   BMI 25.51 kg/m  , BMI Body mass index is 25.51 kg/m.  Wt Readings from Last 3 Encounters:  09/20/20 135 lb (61.2 kg)  08/31/20 130 lb 6.4 oz (59.1 kg)  08/11/20 131 lb 9.6 oz (59.7 kg)    GEN: Well nourished, well developed, in no acute distress. HEENT: normal. Neck: Supple, no JVD, carotid bruits, or masses. Cardiac: RRR, no murmurs, rubs, or gallops. No clubbing, cyanosis.  Bilateral 1+ pitting edema..  Radials/PT 2+ and equal bilaterally.  Respiratory:  Respirations regular and unlabored, clear to auscultation bilaterally. GI: Soft, nontender, nondistended. MS: No deformity or atrophy. Skin: Warm and dry, no rash. Neuro:  Strength and sensation are intact. Psych: Normal affect.  Assessment & Plan    Chronic diastolic heart failure - Weight up 5 pounds over last 1 month.  GDMT with Torsemide, Jardiance.  Coreg transitioned to Diltiazem - no available documentation - anticipate may have been changed for easier once daily dosing. Change Torsemide to 40mg  one time daily as she has been unable to keep up with BID dosing. BMP in 2 weeks for monitoring. Language barrier makes medication changes difficult - I did call her pharmacy they are changing so her prescriptions print in 08/13/20 which is her written language.  Anasarca / Hypoalbuminemia - Elevated CA125 -does not desire invasive procedures or therapies at this time.  Volume management, as above.  Continue to follow-up with primary care.  HTN - BP reasonably controlled. Continue current antihypertensive regimen.  Increase diuresis, as above. Anticipate BP will improve with improvement in volume control.  History of CVA -continue aspirin, atorvastatin.  No signs of recurrence.  HLD -continue atorvastatin 40 mg daily.  TAA - 4.1cm.  Does not wish for surgical intervention so will defer monitoring.  Disposition: Follow up in 1 month(s) with Dr. Cristal Deer or APP.  Signed, Alver Sorrow, NP 09/20/2020, 8:23 AM [facility] Is her daughter with her

## 2020-09-20 NOTE — Patient Instructions (Addendum)
Medication Instructions:   Your physician has recommended you make the following change in your medication:   CHANGE Torsemide to TWO tablet (40mg ) once per day   *If you need a refill on your cardiac medications before your next appointment, please call your pharmacy*   Lab Work: Get labs at in TWO Weeks on August 10th (BMP)  Testing/Procedures: None ordered today.    Follow-Up: At [facility], you and your health needs are our priority.  As part of our continuing mission to provide you with exceptional heart care, we have created designated Provider Care Teams.  These Care Teams include your primary Cardiologist (physician) and Advanced Practice Providers (APPs -  Physician Assistants and Nurse Practitioners) who all work together to provide you with the care you need, when you need it.  We recommend signing up for the patient portal called "MyChart".  Sign up information is provided on this After Visit Summary.  MyChart is used to connect with patients for Virtual Visits (Telemedicine).  Patients are able to view lab/test results, encounter notes, upcoming appointments, etc.  Non-urgent messages can be sent to your provider as well.   To learn more about what you can do with MyChart, go to [facility].    Your next appointment:   August 26th at 9 am   Other Instructions  To prevent or reduce lower extremity swelling: Eat a low salt diet.  Sit with legs elevated. For example, in the recliner or on an ottoman.  Wear knee-high compression stockings during the daytime.   Recommend weighing daily and keeping a log. Please call our office if you have weight gain of 2 pounds overnight or 5 pounds in 1 week.   Date  Time Weight

## 2020-10-20 ENCOUNTER — Ambulatory Visit (HOSPITAL_BASED_OUTPATIENT_CLINIC_OR_DEPARTMENT_OTHER): Payer: Medicaid Other | Admitting: Cardiology

## 2020-10-23 ENCOUNTER — Other Ambulatory Visit (HOSPITAL_COMMUNITY): Payer: Self-pay
